# Patient Record
Sex: Male | Born: 1950
Health system: Southern US, Community
[De-identification: ages and names within clinical notes are randomized; demographics above are authoritative.]

## PROBLEM LIST (undated history)

## (undated) DIAGNOSIS — F32A Depression, unspecified: Secondary | ICD-10-CM

## (undated) DIAGNOSIS — R5383 Other fatigue: Secondary | ICD-10-CM

## (undated) DIAGNOSIS — M549 Dorsalgia, unspecified: Secondary | ICD-10-CM

## (undated) DIAGNOSIS — F419 Anxiety disorder, unspecified: Secondary | ICD-10-CM

## (undated) DIAGNOSIS — M1990 Unspecified osteoarthritis, unspecified site: Secondary | ICD-10-CM

## (undated) DIAGNOSIS — E7800 Pure hypercholesterolemia, unspecified: Secondary | ICD-10-CM

## (undated) DIAGNOSIS — R1310 Dysphagia, unspecified: Secondary | ICD-10-CM

## (undated) DIAGNOSIS — I10 Essential (primary) hypertension: Secondary | ICD-10-CM

## (undated) DIAGNOSIS — Z8719 Personal history of other diseases of the digestive system: Secondary | ICD-10-CM

## (undated) DIAGNOSIS — G4730 Sleep apnea, unspecified: Secondary | ICD-10-CM

## (undated) DIAGNOSIS — F329 Major depressive disorder, single episode, unspecified: Secondary | ICD-10-CM

## (undated) DIAGNOSIS — C44300 Unspecified malignant neoplasm of skin of unspecified part of face: Secondary | ICD-10-CM

## (undated) DIAGNOSIS — J449 Chronic obstructive pulmonary disease, unspecified: Secondary | ICD-10-CM

## (undated) DIAGNOSIS — M792 Neuralgia and neuritis, unspecified: Secondary | ICD-10-CM

## (undated) HISTORY — PX: OTHER SURGICAL HISTORY: SHX169

## (undated) HISTORY — DX: Neuralgia and neuritis, unspecified: M79.2

## (undated) HISTORY — DX: Sleep apnea, unspecified: G47.30

---

## 1997-12-04 ENCOUNTER — Encounter: Payer: Self-pay | Admitting: Otolaryngology

## 1997-12-04 ENCOUNTER — Inpatient Hospital Stay (HOSPITAL_COMMUNITY): Admission: AD | Admit: 1997-12-04 | Discharge: 1997-12-08 | Payer: Self-pay | Admitting: Family Medicine

## 1997-12-10 ENCOUNTER — Encounter: Admission: RE | Admit: 1997-12-10 | Discharge: 1997-12-10 | Payer: Self-pay | Admitting: Family Medicine

## 1999-02-05 ENCOUNTER — Encounter: Admission: RE | Admit: 1999-02-05 | Discharge: 1999-03-20 | Payer: Self-pay

## 1999-04-20 ENCOUNTER — Encounter: Payer: Self-pay | Admitting: Neurosurgery

## 1999-04-20 ENCOUNTER — Ambulatory Visit (HOSPITAL_COMMUNITY): Admission: RE | Admit: 1999-04-20 | Discharge: 1999-04-20 | Payer: Self-pay | Admitting: Neurosurgery

## 1999-05-06 ENCOUNTER — Encounter: Admission: RE | Admit: 1999-05-06 | Discharge: 1999-08-04 | Payer: Self-pay | Admitting: Neurosurgery

## 2001-07-01 ENCOUNTER — Ambulatory Visit (HOSPITAL_COMMUNITY): Admission: RE | Admit: 2001-07-01 | Discharge: 2001-07-01 | Payer: Self-pay | Admitting: Internal Medicine

## 2002-01-02 ENCOUNTER — Encounter: Payer: Self-pay | Admitting: Family Medicine

## 2002-01-02 ENCOUNTER — Ambulatory Visit (HOSPITAL_COMMUNITY): Admission: RE | Admit: 2002-01-02 | Discharge: 2002-01-02 | Payer: Self-pay | Admitting: Family Medicine

## 2003-03-03 ENCOUNTER — Emergency Department (HOSPITAL_COMMUNITY): Admission: EM | Admit: 2003-03-03 | Discharge: 2003-03-03 | Payer: Self-pay | Admitting: Emergency Medicine

## 2003-12-25 ENCOUNTER — Emergency Department (HOSPITAL_COMMUNITY): Admission: EM | Admit: 2003-12-25 | Discharge: 2003-12-25 | Payer: Self-pay | Admitting: Emergency Medicine

## 2004-01-04 ENCOUNTER — Emergency Department (HOSPITAL_COMMUNITY): Admission: EM | Admit: 2004-01-04 | Discharge: 2004-01-04 | Payer: Self-pay | Admitting: Emergency Medicine

## 2004-01-06 ENCOUNTER — Emergency Department (HOSPITAL_COMMUNITY): Admission: EM | Admit: 2004-01-06 | Discharge: 2004-01-06 | Payer: Self-pay | Admitting: Emergency Medicine

## 2004-01-09 ENCOUNTER — Ambulatory Visit (HOSPITAL_COMMUNITY): Admission: RE | Admit: 2004-01-09 | Discharge: 2004-01-09 | Payer: Self-pay | Admitting: *Deleted

## 2004-01-21 ENCOUNTER — Emergency Department (HOSPITAL_COMMUNITY): Admission: EM | Admit: 2004-01-21 | Discharge: 2004-01-21 | Payer: Self-pay | Admitting: Emergency Medicine

## 2004-11-06 ENCOUNTER — Inpatient Hospital Stay (HOSPITAL_COMMUNITY): Admission: AD | Admit: 2004-11-06 | Discharge: 2004-11-07 | Payer: Self-pay | Admitting: Family Medicine

## 2004-11-07 ENCOUNTER — Ambulatory Visit: Payer: Self-pay | Admitting: Internal Medicine

## 2004-11-14 ENCOUNTER — Ambulatory Visit (HOSPITAL_COMMUNITY): Admission: RE | Admit: 2004-11-14 | Discharge: 2004-11-14 | Payer: Self-pay | Admitting: Family Medicine

## 2004-11-26 ENCOUNTER — Ambulatory Visit: Payer: Self-pay | Admitting: Internal Medicine

## 2004-12-26 ENCOUNTER — Ambulatory Visit: Payer: Self-pay | Admitting: Internal Medicine

## 2005-02-11 ENCOUNTER — Ambulatory Visit: Payer: Self-pay | Admitting: Internal Medicine

## 2005-04-10 ENCOUNTER — Ambulatory Visit: Payer: Self-pay | Admitting: Internal Medicine

## 2005-04-14 ENCOUNTER — Ambulatory Visit: Payer: Self-pay | Admitting: Cardiology

## 2005-05-31 ENCOUNTER — Emergency Department (HOSPITAL_COMMUNITY): Admission: EM | Admit: 2005-05-31 | Discharge: 2005-05-31 | Payer: Self-pay | Admitting: Emergency Medicine

## 2005-06-01 ENCOUNTER — Ambulatory Visit: Payer: Self-pay | Admitting: Internal Medicine

## 2007-08-07 ENCOUNTER — Emergency Department (HOSPITAL_COMMUNITY): Admission: EM | Admit: 2007-08-07 | Discharge: 2007-08-07 | Payer: Self-pay | Admitting: Emergency Medicine

## 2007-08-16 ENCOUNTER — Encounter (INDEPENDENT_AMBULATORY_CARE_PROVIDER_SITE_OTHER): Payer: Self-pay | Admitting: General Surgery

## 2007-08-16 ENCOUNTER — Inpatient Hospital Stay (HOSPITAL_COMMUNITY): Admission: AD | Admit: 2007-08-16 | Discharge: 2007-08-19 | Payer: Self-pay | Admitting: Family Medicine

## 2008-01-13 DIAGNOSIS — T1490XA Injury, unspecified, initial encounter: Secondary | ICD-10-CM

## 2008-01-13 HISTORY — DX: Injury, unspecified, initial encounter: T14.90XA

## 2008-01-13 HISTORY — PX: TRACHEOSTOMY: SUR1362

## 2008-01-23 ENCOUNTER — Encounter (INDEPENDENT_AMBULATORY_CARE_PROVIDER_SITE_OTHER): Payer: Self-pay | Admitting: General Surgery

## 2008-01-23 ENCOUNTER — Ambulatory Visit (HOSPITAL_COMMUNITY): Admission: RE | Admit: 2008-01-23 | Discharge: 2008-01-23 | Payer: Self-pay | Admitting: General Surgery

## 2008-02-03 ENCOUNTER — Emergency Department (HOSPITAL_COMMUNITY): Admission: EM | Admit: 2008-02-03 | Discharge: 2008-02-03 | Payer: Self-pay | Admitting: Emergency Medicine

## 2008-05-09 ENCOUNTER — Ambulatory Visit: Admission: RE | Admit: 2008-05-09 | Discharge: 2008-05-09 | Payer: Self-pay | Admitting: Family Medicine

## 2008-06-25 ENCOUNTER — Ambulatory Visit: Payer: Self-pay | Admitting: Surgery

## 2008-08-21 ENCOUNTER — Ambulatory Visit: Payer: Self-pay | Admitting: Vascular Surgery

## 2008-12-13 ENCOUNTER — Ambulatory Visit: Payer: Self-pay | Admitting: Vascular Surgery

## 2008-12-21 ENCOUNTER — Inpatient Hospital Stay (HOSPITAL_COMMUNITY): Admission: EM | Admit: 2008-12-21 | Discharge: 2009-01-01 | Payer: Self-pay | Admitting: Emergency Medicine

## 2008-12-21 ENCOUNTER — Encounter: Payer: Self-pay | Admitting: Emergency Medicine

## 2009-01-01 ENCOUNTER — Inpatient Hospital Stay: Admission: AD | Admit: 2009-01-01 | Discharge: 2009-02-04 | Payer: Self-pay

## 2009-01-01 ENCOUNTER — Ambulatory Visit: Payer: Self-pay | Admitting: Critical Care Medicine

## 2009-01-14 ENCOUNTER — Ambulatory Visit: Payer: Self-pay | Admitting: Pulmonary Disease

## 2009-04-04 ENCOUNTER — Encounter: Admission: RE | Admit: 2009-04-04 | Discharge: 2009-04-04 | Payer: Self-pay | Admitting: Neurosurgery

## 2009-06-01 ENCOUNTER — Emergency Department (HOSPITAL_COMMUNITY): Admission: EM | Admit: 2009-06-01 | Discharge: 2009-06-01 | Payer: Self-pay | Admitting: Emergency Medicine

## 2009-06-12 ENCOUNTER — Encounter: Admission: RE | Admit: 2009-06-12 | Discharge: 2009-06-12 | Payer: Self-pay | Admitting: Neurosurgery

## 2009-11-04 ENCOUNTER — Ambulatory Visit: Payer: Self-pay | Admitting: Surgery

## 2010-03-30 LAB — COMPREHENSIVE METABOLIC PANEL
ALT: 24 U/L (ref 0–53)
ALT: 33 U/L (ref 0–53)
AST: 16 U/L (ref 0–37)
Alkaline Phosphatase: 92 U/L (ref 39–117)
BUN: 10 mg/dL (ref 6–23)
CO2: 30 mEq/L (ref 19–32)
CO2: 31 mEq/L (ref 19–32)
Calcium: 8.6 mg/dL (ref 8.4–10.5)
Calcium: 8.6 mg/dL (ref 8.4–10.5)
Creatinine, Ser: 0.51 mg/dL (ref 0.4–1.5)
GFR calc non Af Amer: >60 mL/min (ref >60)
Glucose, Bld: 126 mg/dL — ABNORMAL HIGH (ref 70–99)
Potassium: 3.9 mEq/L (ref 3.5–5.1)
Sodium: 134 mEq/L — ABNORMAL LOW (ref 135–145)
Sodium: 134 mEq/L — ABNORMAL LOW (ref 135–145)
Total Protein: 5.9 g/dL — ABNORMAL LOW (ref 6.0–8.3)
Total Protein: 6.5 g/dL (ref 6.0–8.3)

## 2010-03-30 LAB — CBC
HCT: 26.7 % — ABNORMAL LOW (ref 39.0–52.0)
Hemoglobin: 9.4 g/dL — ABNORMAL LOW (ref 13.0–17.0)
MCHC: 34.7 g/dL (ref 30.0–36.0)
MCHC: 35.1 g/dL (ref 30.0–36.0)
MCV: 89.5 fL (ref 78.0–100.0)
MCV: 90.6 fL (ref 78.0–100.0)
Platelets: 142 10*3/uL — ABNORMAL LOW (ref 150–400)
RBC: 2.95 MIL/uL — ABNORMAL LOW (ref 4.22–5.81)
RBC: 3.09 MIL/uL — ABNORMAL LOW (ref 4.22–5.81)
WBC: 7.8 10*3/uL (ref 4.0–10.5)
WBC: 7.8 10*3/uL (ref 4.0–10.5)

## 2010-03-30 LAB — MAGNESIUM: Magnesium: 1.8 mg/dL (ref 1.5–2.5)

## 2010-03-30 LAB — DIFFERENTIAL
Basophils Absolute: 0 10*3/uL (ref 0.0–0.1)
Eosinophils Absolute: 0.2 10*3/uL (ref 0.0–0.7)
Eosinophils Relative: 3 % (ref 0–5)
Eosinophils Relative: 3 % (ref 0–5)
Lymphocytes Relative: 14 % (ref 12–46)
Lymphocytes Relative: 18 % (ref 12–46)
Lymphs Abs: 1 10*3/uL (ref 0.7–4.0)
Lymphs Abs: 1.4 10*3/uL (ref 0.7–4.0)
Monocytes Absolute: 0.7 10*3/uL (ref 0.1–1.0)
Neutrophils Relative %: 71 % (ref 43–77)

## 2010-03-30 LAB — PREALBUMIN: Prealbumin: 20.8 mg/dL (ref 18.0–45.0)

## 2010-03-31 LAB — CBC
HCT: 33.4 % — ABNORMAL LOW (ref 39.0–52.0)
MCHC: 33.7 g/dL (ref 30.0–36.0)
MCV: 89.4 fL (ref 78.0–100.0)
Platelets: 302 10*3/uL (ref 150–400)
RDW: 15.4 % (ref 11.5–15.5)

## 2010-03-31 LAB — BASIC METABOLIC PANEL
BUN: 8 mg/dL (ref 6–23)
CO2: 30 mEq/L (ref 19–32)
Calcium: 8.7 mg/dL (ref 8.4–10.5)
Chloride: 98 mEq/L (ref 96–112)
Creatinine, Ser: 0.63 mg/dL (ref 0.4–1.5)
Glucose, Bld: 102 mg/dL — ABNORMAL HIGH (ref 70–99)

## 2010-04-14 LAB — COMPREHENSIVE METABOLIC PANEL
ALT: 69 U/L — ABNORMAL HIGH (ref 0–53)
ALT: 79 U/L — ABNORMAL HIGH (ref 0–53)
AST: 46 U/L — ABNORMAL HIGH (ref 0–37)
Albumin: 1.8 g/dL — ABNORMAL LOW (ref 3.5–5.2)
Albumin: 1.8 g/dL — ABNORMAL LOW (ref 3.5–5.2)
Albumin: 2.2 g/dL — ABNORMAL LOW (ref 3.5–5.2)
BUN: 24 mg/dL — ABNORMAL HIGH (ref 6–23)
CO2: 29 mEq/L (ref 19–32)
Calcium: 8 mg/dL — ABNORMAL LOW (ref 8.4–10.5)
Calcium: 8.1 mg/dL — ABNORMAL LOW (ref 8.4–10.5)
Creatinine, Ser: 0.61 mg/dL (ref 0.4–1.5)
Creatinine, Ser: 0.82 mg/dL (ref 0.4–1.5)
GFR calc Af Amer: >60 mL/min (ref >60)
GFR calc Af Amer: >60 mL/min (ref >60)
GFR calc non Af Amer: >60 mL/min (ref >60)
Glucose, Bld: 155 mg/dL — ABNORMAL HIGH (ref 70–99)
Glucose, Bld: 165 mg/dL — ABNORMAL HIGH (ref 70–99)
Potassium: 4.2 mEq/L (ref 3.5–5.1)
Sodium: 134 mEq/L — ABNORMAL LOW (ref 135–145)
Sodium: 142 mEq/L (ref 135–145)
Total Protein: 6 g/dL (ref 6.0–8.3)
Total Protein: 6 g/dL (ref 6.0–8.3)

## 2010-04-14 LAB — BLOOD GAS, ARTERIAL
Acid-Base Excess: 3.7 mmol/L — ABNORMAL HIGH (ref 0.0–2.0)
Acid-Base Excess: 6.8 mmol/L — ABNORMAL HIGH (ref 0.0–2.0)
Acid-Base Excess: 7.8 mmol/L — ABNORMAL HIGH (ref 0.0–2.0)
Bicarbonate: 27.7 mEq/L — ABNORMAL HIGH (ref 20.0–24.0)
Bicarbonate: 31.3 mEq/L — ABNORMAL HIGH (ref 20.0–24.0)
Bicarbonate: 31.7 mEq/L — ABNORMAL HIGH (ref 20.0–24.0)
Drawn by: 31004
Drawn by: 319961
FIO2: 0.3 %
FIO2: 0.4 %
FIO2: 0.4 %
MECHVT: 550 mL
MECHVT: 550 mL
MECHVT: 650 mL
O2 Saturation: 98.3 %
O2 Saturation: 99.1 %
PEEP: 5 cmH2O
PEEP: 5 cmH2O
PEEP: 5 cmH2O
PEEP: 5 cmH2O
Patient temperature: 98.6
Patient temperature: 98.6
Patient temperature: 99
RATE: 18 resp/min
RATE: 18 resp/min
RATE: 18 resp/min
RATE: 18 resp/min
TCO2: 29 mmol/L (ref 0–100)
TCO2: 31.1 mmol/L (ref 0–100)
pCO2 arterial: 43.9 mmHg (ref 35.0–45.0)
pCO2 arterial: 51.3 mmHg — ABNORMAL HIGH (ref 35.0–45.0)
pH, Arterial: 7.461 — ABNORMAL HIGH (ref 7.350–7.450)
pO2, Arterial: 127 mmHg — ABNORMAL HIGH (ref 80.0–100.0)
pO2, Arterial: 91.1 mmHg (ref 80.0–100.0)
pO2, Arterial: 96.9 mmHg (ref 80.0–100.0)

## 2010-04-14 LAB — CBC
HCT: 25.6 % — ABNORMAL LOW (ref 39.0–52.0)
HCT: 25.9 % — ABNORMAL LOW (ref 39.0–52.0)
HCT: 26.2 % — ABNORMAL LOW (ref 39.0–52.0)
HCT: 28 % — ABNORMAL LOW (ref 39.0–52.0)
HCT: 29.7 % — ABNORMAL LOW (ref 39.0–52.0)
Hemoglobin: 8.1 g/dL — ABNORMAL LOW (ref 13.0–17.0)
Hemoglobin: 8.8 g/dL — ABNORMAL LOW (ref 13.0–17.0)
Hemoglobin: 8.9 g/dL — ABNORMAL LOW (ref 13.0–17.0)
Hemoglobin: 9 g/dL — ABNORMAL LOW (ref 13.0–17.0)
Hemoglobin: 9.5 g/dL — ABNORMAL LOW (ref 13.0–17.0)
MCHC: 33.6 g/dL (ref 30.0–36.0)
MCHC: 33.9 g/dL (ref 30.0–36.0)
MCHC: 34.3 g/dL (ref 30.0–36.0)
MCHC: 34.5 g/dL (ref 30.0–36.0)
MCV: 92.8 fL (ref 78.0–100.0)
MCV: 93.4 fL (ref 78.0–100.0)
MCV: 93.4 fL (ref 78.0–100.0)
MCV: 94.1 fL (ref 78.0–100.0)
MCV: 94.2 fL (ref 78.0–100.0)
MCV: 94.9 fL (ref 78.0–100.0)
Platelets: 202 10*3/uL (ref 150–400)
Platelets: 214 10*3/uL (ref 150–400)
Platelets: 225 10*3/uL (ref 150–400)
Platelets: 239 10*3/uL (ref 150–400)
Platelets: 305 10*3/uL (ref 150–400)
RBC: 2.58 MIL/uL — ABNORMAL LOW (ref 4.22–5.81)
RBC: 2.75 MIL/uL — ABNORMAL LOW (ref 4.22–5.81)
RBC: 2.75 MIL/uL — ABNORMAL LOW (ref 4.22–5.81)
RBC: 2.94 MIL/uL — ABNORMAL LOW (ref 4.22–5.81)
RBC: 2.98 MIL/uL — ABNORMAL LOW (ref 4.22–5.81)
RDW: 14.8 % (ref 11.5–15.5)
RDW: 15.2 % (ref 11.5–15.5)
RDW: 15.9 % — ABNORMAL HIGH (ref 11.5–15.5)
WBC: 10.6 10*3/uL — ABNORMAL HIGH (ref 4.0–10.5)
WBC: 11.3 10*3/uL — ABNORMAL HIGH (ref 4.0–10.5)
WBC: 11.9 10*3/uL — ABNORMAL HIGH (ref 4.0–10.5)
WBC: 8.1 10*3/uL (ref 4.0–10.5)
WBC: 9.4 10*3/uL (ref 4.0–10.5)
WBC: 9.4 10*3/uL (ref 4.0–10.5)

## 2010-04-14 LAB — BASIC METABOLIC PANEL
BUN: 19 mg/dL (ref 6–23)
BUN: 20 mg/dL (ref 6–23)
BUN: 22 mg/dL (ref 6–23)
CO2: 30 mEq/L (ref 19–32)
CO2: 31 mEq/L (ref 19–32)
CO2: 31 mEq/L (ref 19–32)
CO2: 32 mEq/L (ref 19–32)
CO2: 34 mEq/L — ABNORMAL HIGH (ref 19–32)
Calcium: 7.2 mg/dL — ABNORMAL LOW (ref 8.4–10.5)
Calcium: 7.8 mg/dL — ABNORMAL LOW (ref 8.4–10.5)
Calcium: 7.9 mg/dL — ABNORMAL LOW (ref 8.4–10.5)
Calcium: 8 mg/dL — ABNORMAL LOW (ref 8.4–10.5)
Chloride: 108 mEq/L (ref 96–112)
Chloride: 109 mEq/L (ref 96–112)
Chloride: 110 mEq/L (ref 96–112)
Chloride: 111 mEq/L (ref 96–112)
Chloride: 112 mEq/L (ref 96–112)
Creatinine, Ser: 0.93 mg/dL (ref 0.4–1.5)
Creatinine, Ser: 0.98 mg/dL (ref 0.4–1.5)
Creatinine, Ser: 1.01 mg/dL (ref 0.4–1.5)
GFR calc Af Amer: >60 mL/min (ref >60)
GFR calc Af Amer: >60 mL/min (ref >60)
GFR calc Af Amer: >60 mL/min (ref >60)
GFR calc non Af Amer: >60 mL/min (ref >60)
GFR calc non Af Amer: >60 mL/min (ref >60)
Glucose, Bld: 154 mg/dL — ABNORMAL HIGH (ref 70–99)
Potassium: 3.4 mEq/L — ABNORMAL LOW (ref 3.5–5.1)
Potassium: 3.5 mEq/L (ref 3.5–5.1)
Potassium: 3.7 mEq/L (ref 3.5–5.1)
Sodium: 146 mEq/L — ABNORMAL HIGH (ref 135–145)
Sodium: 148 mEq/L — ABNORMAL HIGH (ref 135–145)
Sodium: 150 mEq/L — ABNORMAL HIGH (ref 135–145)

## 2010-04-14 LAB — GLUCOSE, CAPILLARY
Glucose-Capillary: 110 mg/dL — ABNORMAL HIGH (ref 70–99)
Glucose-Capillary: 132 mg/dL — ABNORMAL HIGH (ref 70–99)
Glucose-Capillary: 132 mg/dL — ABNORMAL HIGH (ref 70–99)
Glucose-Capillary: 136 mg/dL — ABNORMAL HIGH (ref 70–99)
Glucose-Capillary: 139 mg/dL — ABNORMAL HIGH (ref 70–99)
Glucose-Capillary: 149 mg/dL — ABNORMAL HIGH (ref 70–99)
Glucose-Capillary: 164 mg/dL — ABNORMAL HIGH (ref 70–99)
Glucose-Capillary: 170 mg/dL — ABNORMAL HIGH (ref 70–99)
Glucose-Capillary: 192 mg/dL — ABNORMAL HIGH (ref 70–99)
Glucose-Capillary: 247 mg/dL — ABNORMAL HIGH (ref 70–99)
Glucose-Capillary: 261 mg/dL — ABNORMAL HIGH (ref 70–99)
Glucose-Capillary: 266 mg/dL — ABNORMAL HIGH (ref 70–99)

## 2010-04-14 LAB — VITAMIN D 1,25 DIHYDROXY
Vitamin D 1, 25 (OH)2 Total: 20 pg/mL (ref 18–72)
Vitamin D 1, 25 (OH)2 Total: 23 pg/mL (ref 18–72)
Vitamin D2 1, 25 (OH)2: <8 pg/mL
Vitamin D2 1, 25 (OH)2: <8 pg/mL
Vitamin D3 1, 25 (OH)2: 20 pg/mL
Vitamin D3 1, 25 (OH)2: 23 pg/mL

## 2010-04-14 LAB — PREALBUMIN: Prealbumin: 19.1 mg/dL (ref 18.0–45.0)

## 2010-04-14 LAB — DIFFERENTIAL
Basophils Absolute: 0 10*3/uL (ref 0.0–0.1)
Basophils Relative: 0 % (ref 0–1)
Eosinophils Absolute: 0.2 10*3/uL (ref 0.0–0.7)
Lymphocytes Relative: 11 % — ABNORMAL LOW (ref 12–46)
Lymphs Abs: 1.1 10*3/uL (ref 0.7–4.0)
Monocytes Absolute: 0.5 10*3/uL (ref 0.1–1.0)
Monocytes Absolute: 0.7 10*3/uL (ref 0.1–1.0)
Monocytes Relative: 4 % (ref 3–12)
Monocytes Relative: 7 % (ref 3–12)
Neutro Abs: 10.1 10*3/uL — ABNORMAL HIGH (ref 1.7–7.7)
Neutrophils Relative %: 81 % — ABNORMAL HIGH (ref 43–77)
Neutrophils Relative %: 82 % — ABNORMAL HIGH (ref 43–77)

## 2010-04-14 LAB — LIPID PANEL
Cholesterol: 112 mg/dL (ref 0–200)
LDL Cholesterol: 81 mg/dL (ref 0–99)

## 2010-04-14 LAB — FERRITIN: Ferritin: 853 ng/mL — ABNORMAL HIGH (ref 22–322)

## 2010-04-14 LAB — IRON AND TIBC
Saturation Ratios: 14 % — ABNORMAL LOW (ref 20–55)
TIBC: 155 ug/dL — ABNORMAL LOW (ref 215–435)
UIBC: 133 ug/dL

## 2010-04-14 LAB — TSH: TSH: 2.939 u[IU]/mL (ref 0.350–4.500)

## 2010-04-15 LAB — BASIC METABOLIC PANEL
BUN: 18 mg/dL (ref 6–23)
BUN: 22 mg/dL (ref 6–23)
BUN: 39 mg/dL — ABNORMAL HIGH (ref 6–23)
BUN: 41 mg/dL — ABNORMAL HIGH (ref 6–23)
CO2: 26 mEq/L (ref 19–32)
CO2: 29 mEq/L (ref 19–32)
Calcium: 7.6 mg/dL — ABNORMAL LOW (ref 8.4–10.5)
Calcium: 7.9 mg/dL — ABNORMAL LOW (ref 8.4–10.5)
Calcium: 8.3 mg/dL — ABNORMAL LOW (ref 8.4–10.5)
Chloride: 100 mEq/L (ref 96–112)
Chloride: 101 mEq/L (ref 96–112)
Chloride: 106 mEq/L (ref 96–112)
Creatinine, Ser: 0.9 mg/dL (ref 0.4–1.5)
Creatinine, Ser: 1.91 mg/dL — ABNORMAL HIGH (ref 0.4–1.5)
GFR calc Af Amer: 24 mL/min — ABNORMAL LOW (ref >60)
GFR calc Af Amer: 44 mL/min — ABNORMAL LOW (ref >60)
GFR calc Af Amer: >60 mL/min (ref >60)
GFR calc non Af Amer: 20 mL/min — ABNORMAL LOW (ref >60)
GFR calc non Af Amer: 36 mL/min — ABNORMAL LOW (ref >60)
GFR calc non Af Amer: 37 mL/min — ABNORMAL LOW (ref >60)
GFR calc non Af Amer: >60 mL/min (ref >60)
Glucose, Bld: 124 mg/dL — ABNORMAL HIGH (ref 70–99)
Glucose, Bld: 195 mg/dL — ABNORMAL HIGH (ref 70–99)
Glucose, Bld: 212 mg/dL — ABNORMAL HIGH (ref 70–99)
Glucose, Bld: 213 mg/dL — ABNORMAL HIGH (ref 70–99)
Potassium: 3.5 mEq/L (ref 3.5–5.1)
Potassium: 3.7 mEq/L (ref 3.5–5.1)
Potassium: 4.7 mEq/L (ref 3.5–5.1)
Potassium: 4.9 mEq/L (ref 3.5–5.1)
Sodium: 131 mEq/L — ABNORMAL LOW (ref 135–145)
Sodium: 133 mEq/L — ABNORMAL LOW (ref 135–145)
Sodium: 135 mEq/L (ref 135–145)
Sodium: 142 mEq/L (ref 135–145)

## 2010-04-15 LAB — URINALYSIS, ROUTINE W REFLEX MICROSCOPIC
Ketones, ur: NEGATIVE mg/dL
Leukocytes, UA: NEGATIVE
Leukocytes, UA: NEGATIVE
Nitrite: NEGATIVE
Nitrite: NEGATIVE
Protein, ur: NEGATIVE mg/dL
Specific Gravity, Urine: 1.036 — ABNORMAL HIGH (ref 1.005–1.030)
Urobilinogen, UA: 0.2 mg/dL (ref 0.0–1.0)
Urobilinogen, UA: 0.2 mg/dL (ref 0.0–1.0)
pH: 5 (ref 5.0–8.0)

## 2010-04-15 LAB — URINE CULTURE
Colony Count: 50000
Colony Count: NO GROWTH
Culture: NO GROWTH

## 2010-04-15 LAB — CBC
HCT: 47.8 % (ref 39.0–52.0)
HCT: 29.1 % — ABNORMAL LOW (ref 39.0–52.0)
HCT: 29.5 % — ABNORMAL LOW (ref 39.0–52.0)
HCT: 30.7 % — ABNORMAL LOW (ref 39.0–52.0)
HCT: 46.8 % (ref 39.0–52.0)
Hemoglobin: 16.3 g/dL (ref 13.0–17.0)
Hemoglobin: 10 g/dL — ABNORMAL LOW (ref 13.0–17.0)
Hemoglobin: 10.3 g/dL — ABNORMAL LOW (ref 13.0–17.0)
Hemoglobin: 10.7 g/dL — ABNORMAL LOW (ref 13.0–17.0)
Hemoglobin: 12.2 g/dL — ABNORMAL LOW (ref 13.0–17.0)
Hemoglobin: 15.7 g/dL (ref 13.0–17.0)
MCHC: 34.4 g/dL (ref 30.0–36.0)
MCHC: 34.9 g/dL (ref 30.0–36.0)
MCHC: 35 g/dL (ref 30.0–36.0)
MCHC: 35.1 g/dL (ref 30.0–36.0)
MCV: 92.8 fL (ref 78.0–100.0)
MCV: 93 fL (ref 78.0–100.0)
MCV: 93 fL (ref 78.0–100.0)
Platelets: 128 10*3/uL — ABNORMAL LOW (ref 150–400)
Platelets: 149 10*3/uL — ABNORMAL LOW (ref 150–400)
Platelets: 160 10*3/uL (ref 150–400)
Platelets: 203 10*3/uL (ref 150–400)
RBC: 5.23 MIL/uL (ref 4.22–5.81)
RBC: 3.13 MIL/uL — ABNORMAL LOW (ref 4.22–5.81)
RBC: 3.18 MIL/uL — ABNORMAL LOW (ref 4.22–5.81)
RBC: 3.31 MIL/uL — ABNORMAL LOW (ref 4.22–5.81)
RBC: 3.78 MIL/uL — ABNORMAL LOW (ref 4.22–5.81)
RBC: 4.88 MIL/uL (ref 4.22–5.81)
RDW: 13.5 % (ref 11.5–15.5)
RDW: 14 % (ref 11.5–15.5)
RDW: 14.2 % (ref 11.5–15.5)
RDW: 14.4 % (ref 11.5–15.5)
WBC: 10.1 10*3/uL (ref 4.0–10.5)
WBC: 10.3 10*3/uL (ref 4.0–10.5)
WBC: 10.7 10*3/uL — ABNORMAL HIGH (ref 4.0–10.5)
WBC: 9.1 10*3/uL (ref 4.0–10.5)

## 2010-04-15 LAB — BLOOD GAS, ARTERIAL
Acid-Base Excess: 3.2 mmol/L — ABNORMAL HIGH (ref 0.0–2.0)
Acid-Base Excess: 4.8 mmol/L — ABNORMAL HIGH (ref 0.0–2.0)
Acid-Base Excess: 5.6 mmol/L — ABNORMAL HIGH (ref 0.0–2.0)
Acid-base deficit: 1.8 mmol/L (ref 0.0–2.0)
Acid-base deficit: 3.1 mmol/L — ABNORMAL HIGH (ref 0.0–2.0)
Bicarbonate: 22.5 mEq/L (ref 20.0–24.0)
Bicarbonate: 25.7 mEq/L — ABNORMAL HIGH (ref 20.0–24.0)
Bicarbonate: 26.7 mEq/L — ABNORMAL HIGH (ref 20.0–24.0)
Bicarbonate: 28 mEq/L — ABNORMAL HIGH (ref 20.0–24.0)
Bicarbonate: 30.2 mEq/L — ABNORMAL HIGH (ref 20.0–24.0)
Drawn by: 249101
Drawn by: 249101
FIO2: 0.4 %
FIO2: 0.5 %
FIO2: 1 %
FIO2: 40 %
MECHVT: 550 mL
MECHVT: 600 mL
MECHVT: 600 mL
MECHVT: 600 mL
MECHVT: 600 mL
O2 Saturation: 93.1 %
O2 Saturation: 94.1 %
O2 Saturation: 95 %
O2 Saturation: 95.7 %
O2 Saturation: 96 %
O2 Saturation: 96.5 %
O2 Saturation: 99.1 %
PEEP: 5 cmH2O
PEEP: 5 cmH2O
Patient temperature: 98.4
Patient temperature: 98.6
Patient temperature: 98.6
Patient temperature: 98.6
Patient temperature: 99.8
RATE: 20 resp/min
RATE: 26 resp/min
RATE: 26 resp/min
TCO2: 23.4 mmol/L (ref 0–100)
TCO2: 23.7 mmol/L (ref 0–100)
TCO2: 27.2 mmol/L (ref 0–100)
TCO2: 27.9 mmol/L (ref 0–100)
TCO2: 29.1 mmol/L (ref 0–100)
TCO2: 29.3 mmol/L (ref 0–100)
TCO2: 31.7 mmol/L (ref 0–100)
pCO2 arterial: 35.4 mmHg (ref 35.0–45.0)
pCO2 arterial: 37.3 mmHg (ref 35.0–45.0)
pCO2 arterial: 48.4 mmHg — ABNORMAL HIGH (ref 35.0–45.0)
pH, Arterial: 7.101 — CL (ref 7.350–7.450)
pH, Arterial: 7.278 — ABNORMAL LOW (ref 7.350–7.450)
pH, Arterial: 7.378 (ref 7.350–7.450)
pH, Arterial: 7.468 — ABNORMAL HIGH (ref 7.350–7.450)
pH, Arterial: 7.509 — ABNORMAL HIGH (ref 7.350–7.450)
pO2, Arterial: 109 mmHg — ABNORMAL HIGH (ref 80.0–100.0)
pO2, Arterial: 275 mmHg — ABNORMAL HIGH (ref 80.0–100.0)
pO2, Arterial: 68.8 mmHg — ABNORMAL LOW (ref 80.0–100.0)
pO2, Arterial: 69.5 mmHg — ABNORMAL LOW (ref 80.0–100.0)
pO2, Arterial: 78.3 mmHg — ABNORMAL LOW (ref 80.0–100.0)

## 2010-04-15 LAB — POTASSIUM: Potassium: 6.8 mEq/L (ref 3.5–5.1)

## 2010-04-15 LAB — URINE MICROSCOPIC-ADD ON

## 2010-04-15 LAB — URINALYSIS, MICROSCOPIC ONLY
Bilirubin Urine: NEGATIVE
Glucose, UA: 100 mg/dL — AB
Ketones, ur: NEGATIVE mg/dL
Leukocytes, UA: NEGATIVE
Protein, ur: NEGATIVE mg/dL

## 2010-04-15 LAB — GLUCOSE, CAPILLARY
Glucose-Capillary: 134 mg/dL — ABNORMAL HIGH (ref 70–99)
Glucose-Capillary: 144 mg/dL — ABNORMAL HIGH (ref 70–99)
Glucose-Capillary: 150 mg/dL — ABNORMAL HIGH (ref 70–99)
Glucose-Capillary: 153 mg/dL — ABNORMAL HIGH (ref 70–99)
Glucose-Capillary: 160 mg/dL — ABNORMAL HIGH (ref 70–99)
Glucose-Capillary: 169 mg/dL — ABNORMAL HIGH (ref 70–99)
Glucose-Capillary: 171 mg/dL — ABNORMAL HIGH (ref 70–99)
Glucose-Capillary: 183 mg/dL — ABNORMAL HIGH (ref 70–99)
Glucose-Capillary: 190 mg/dL — ABNORMAL HIGH (ref 70–99)
Glucose-Capillary: 191 mg/dL — ABNORMAL HIGH (ref 70–99)
Glucose-Capillary: 209 mg/dL — ABNORMAL HIGH (ref 70–99)
Glucose-Capillary: 215 mg/dL — ABNORMAL HIGH (ref 70–99)
Glucose-Capillary: 216 mg/dL — ABNORMAL HIGH (ref 70–99)
Glucose-Capillary: 231 mg/dL — ABNORMAL HIGH (ref 70–99)
Glucose-Capillary: 234 mg/dL — ABNORMAL HIGH (ref 70–99)

## 2010-04-15 LAB — CULTURE, BAL-QUANTITATIVE W GRAM STAIN
Colony Count: >=100000
Colony Count: >=100000

## 2010-04-15 LAB — CULTURE, BLOOD (ROUTINE X 2)
Culture: NO GROWTH
Culture: NO GROWTH

## 2010-04-15 LAB — COMPREHENSIVE METABOLIC PANEL
ALT: 27 U/L (ref 0–53)
ALT: 51 U/L (ref 0–53)
Alkaline Phosphatase: 69 U/L (ref 39–117)
BUN: 15 mg/dL (ref 6–23)
CO2: 25 mEq/L (ref 19–32)
CO2: 30 mEq/L (ref 19–32)
Calcium: 7.7 mg/dL — ABNORMAL LOW (ref 8.4–10.5)
Chloride: 108 mEq/L (ref 96–112)
Creatinine, Ser: 1 mg/dL (ref 0.4–1.5)
GFR calc non Af Amer: >60 mL/min (ref >60)
GFR calc non Af Amer: >60 mL/min (ref >60)
Glucose, Bld: 273 mg/dL — ABNORMAL HIGH (ref 70–99)
Glucose, Bld: 149 mg/dL — ABNORMAL HIGH (ref 70–99)
Potassium: 3.6 mEq/L (ref 3.5–5.1)
Sodium: 131 mEq/L — ABNORMAL LOW (ref 135–145)
Total Bilirubin: 0.7 mg/dL (ref 0.3–1.2)
Total Bilirubin: 0.9 mg/dL (ref 0.3–1.2)

## 2010-04-15 LAB — DIFFERENTIAL
Basophils Absolute: 0.1 10*3/uL (ref 0.0–0.1)
Basophils Absolute: 0 10*3/uL (ref 0.0–0.1)
Basophils Relative: 0 % (ref 0–1)
Basophils Relative: 0 % (ref 0–1)
Eosinophils Absolute: 0.2 10*3/uL (ref 0.0–0.7)
Eosinophils Absolute: 0.1 10*3/uL (ref 0.0–0.7)
Eosinophils Relative: 1 % (ref 0–5)
Lymphocytes Relative: 9 % — ABNORMAL LOW (ref 12–46)
Lymphs Abs: 1 10*3/uL (ref 0.7–4.0)
Monocytes Absolute: 0.8 10*3/uL (ref 0.1–1.0)
Monocytes Relative: 8 % (ref 3–12)
Neutro Abs: 8.8 10*3/uL — ABNORMAL HIGH (ref 1.7–7.7)
Neutrophils Relative %: 77 % (ref 43–77)
Neutrophils Relative %: 82 % — ABNORMAL HIGH (ref 43–77)

## 2010-04-15 LAB — RENAL FUNCTION PANEL
BUN: 27 mg/dL — ABNORMAL HIGH (ref 6–23)
Creatinine, Ser: 1.17 mg/dL (ref 0.4–1.5)
Glucose, Bld: 181 mg/dL — ABNORMAL HIGH (ref 70–99)
Phosphorus: 3 mg/dL (ref 2.3–4.6)
Potassium: 3.2 mEq/L — ABNORMAL LOW (ref 3.5–5.1)

## 2010-04-15 LAB — PHOSPHORUS: Phosphorus: 4.6 mg/dL (ref 2.3–4.6)

## 2010-04-15 LAB — AMYLASE: Amylase: 62 U/L (ref 0–105)

## 2010-04-15 LAB — CK: Total CK: 1985 U/L — ABNORMAL HIGH (ref 7–232)

## 2010-04-15 LAB — ETHANOL: Alcohol, Ethyl (B): <5 mg/dL (ref 0–10)

## 2010-04-28 LAB — CBC
HCT: 47.1 % (ref 39.0–52.0)
Platelets: 227 10*3/uL (ref 150–400)
WBC: 10.7 10*3/uL — ABNORMAL HIGH (ref 4.0–10.5)

## 2010-04-28 LAB — DIFFERENTIAL
Eosinophils Relative: 2 % (ref 0–5)
Lymphocytes Relative: 17 % (ref 12–46)
Lymphs Abs: 1.8 10*3/uL (ref 0.7–4.0)
Neutrophils Relative %: 75 % (ref 43–77)

## 2010-04-28 LAB — BASIC METABOLIC PANEL
BUN: 13 mg/dL (ref 6–23)
GFR calc non Af Amer: >60 mL/min (ref >60)
Potassium: 3.6 mEq/L (ref 3.5–5.1)

## 2010-04-28 LAB — GLUCOSE, CAPILLARY: Glucose-Capillary: 145 mg/dL — ABNORMAL HIGH (ref 70–99)

## 2010-05-23 ENCOUNTER — Other Ambulatory Visit: Payer: Self-pay | Admitting: Family Medicine

## 2010-05-23 ENCOUNTER — Ambulatory Visit (HOSPITAL_COMMUNITY)
Admission: RE | Admit: 2010-05-23 | Discharge: 2010-05-23 | Disposition: A | Discharge status: Home / Self Care | Payer: MEDICARE | Source: Ambulatory Visit | Attending: Family Medicine | Admitting: Family Medicine

## 2010-05-23 DIAGNOSIS — R110 Nausea: Secondary | ICD-10-CM | POA: Insufficient documentation

## 2010-05-23 DIAGNOSIS — R109 Unspecified abdominal pain: Secondary | ICD-10-CM | POA: Insufficient documentation

## 2010-05-23 MED ORDER — IOHEXOL 300 MG/ML  SOLN
100.0000 mL | Freq: Once | INTRAMUSCULAR | Status: AC | PRN
  Administered 2010-05-23: 100 mL via INTRAVENOUS

## 2010-05-27 NOTE — Assessment & Plan Note (Signed)
OFFICE VISIT   WIN, GUAJARDO  DOB:  23-Nov-1950                                       11/04/2009  EAVWU#:[id number]   The patient returns today for followup of his leg swelling.  He  continues to wear his compression stockings religiously.  He does note  that when he does not wear them his legs swell up rather quickly.  We  have ruled out venous insufficiency as a cause of the swelling.  This  most likely represents lymphedema.  He has not had any ulcerations.  He  does not have any pain associated with this.   PHYSICAL EXAMINATION:  Vital signs:  Heart rate 91, blood pressure  162/107, O2 sat 97%.  General:  He is well-appearing, in no distress.  Lungs:  Respirations nonlabored.  Extremities:  Warm and well-perfused.  There are no active ulcerations at this time.  He still has significant  pitting edema from up to the knee.   ASSESSMENT AND PLAN:  Lymphedema.   The patient is going to require lifelong compression therapy.  He is  instructed to come back to see me if he were to develop any open wounds.  He has been instructed to keep his legs elevated as much as possible and  to definitely wear his compression stockings when he is going to be on  his feet all day.  I have told him to regularly replace his compression  stockings as they get worn out, at least twice a year.  I am going to  have him see me on a p.r.n. basis now.  He will contact me if he has any  questions.     Jorge Ny, MD  Electronically Signed   VWB/MEDQ  D:  11/04/2009  T:  11/05/2009  Job:  [id number]   cc:   Tilford Pillar, MD  Donna Bernard, M.D.

## 2010-05-27 NOTE — Consult Note (Signed)
Dennis Mitchell, PERRELLI               ACCOUNT NO.:  [id number]   MEDICAL RECORD NO.:  [id number]          PATIENT TYPE:  INP   LOCATION:  A332                          FACILITY:  [facility]   PHYSICIAN:  Tilford Pillar, MD      DATE OF BIRTH:  [birth year]   DATE OF CONSULTATION:  08/16/2007  DATE OF DISCHARGE:                                 CONSULTATION   CHIEF COMPLAINT:  Right leg pain, status post dog bite.   HISTORY OF PRESENT ILLNESS:  The patient is a 60 year old male with a  history of hypertension, diabetes mellitus, and hyperlipidemia, who  presents from Dr. Fletcher Anon office with increasing right leg pain and  erythema.  He states, approximately 1-week ago, he was bit by the family  Bertram Denver on his right leg.  He was evaluated initially in the  emergency department and was given antibiotics, had the wound closed  with Steri-Strips and was discharged to home.  He states, he had  continued to have some improvement without any discharge, drainage,  fevers, chills, nausea, or vomiting.  However, approximately 24 hours  ago, he had increasing pain in the right leg, increasing swelling and  edema, and increasing erythema around the area at which point he  presented to Dr. Fletcher Anon office for evaluation.  He still denies any  fever or chills.  He still denies any nausea, vomiting, or bowel  changes.  He denies noticing any odorous changes to the wound.   PAST MEDICAL HISTORY:  1. Hypertension.  2. Diabetes mellitus, type 2.  3. Hyperlipidemia.   PAST SURGICAL HISTORY:  Right foot operation.   MEDICATIONS:  1. Verapamil.  2. Enalapril.  3. Dopamine.   ALLERGIES:  1. ASPIRIN.  2. PENICILLIN causes rash and swelling.   SOCIAL HISTORY:  No tobacco use.  He is a heavy alcohol user.  No  history of delirium tremens.   PHYSICAL EXAMINATION:  VITAL SIGNS:  Temperature is 98.7, heart rate 82,  respirations 16, and blood pressure 139/74.  GENERAL:  The patient is lying in the supine  position in his hospital  bed.  He is not in any acute distress.  He is alert and oriented x3.  He  is obese.  HEENT:  Pupils are equal, round, and reactive.  Extraocular movements  are intact.  No conjunctival pallor is noted.  NECK:  Trachea is midline.  No cervical lymphadenopathy.  PULMONARY:  He has unlabored respirations.  He is clear to auscultation  bilaterally.  CARDIOVASCULAR:  Regular rate and rhythm.  No murmurs or gallops are  apparent.  ABDOMEN:  Obese.  Soft, nontender, and nondistended.  EXTREMITIES:  He has 2+ radial pulses bilaterally.  He has 1+ dorsalis  pedis on the left, dorsalis pedis on the right is nonpalpable, and he  has a 1+ posterior tibialis on the right and on the left.  Extremities  are warm.  He has on his right leg, a approximate 2 x 4.5 cm necrotic  wound on the anterior aspect of his right inferior leg.  This has a foul  odor to  it and has cloudy serous drainage noted from it.  He has boggy  erythema inferior to the wound.  No specific crepitance is noted.  He  has exquisite pain around the wound.  He has positive erythema down to  the dorsal aspect of his foot.  He does not have any diminished range of  motion of his foot.  He has no pain noted with the range of motion or  pain with palpation of his foot.   PERTINENT LABORATORY DATA AND RADIOGRAPHIC STUDIES:  CBC 9.9, hemoglobin  15.8, hematocrit 46.2, and platelets 223.  Basic metabolic panel:  Sodium 128, potassium 3.3, chloride 90, and bicarb 30.  BUN 8 and  creatinine 0.87.  Blood glucose 137.   ASSESSMENT AND PLAN:  Necrotic dog bite to the right foot.  Based on the  physical findings, I have a high suspicion of an anaerobic infection of  this wound.  Necrotizing fasciitis is still a possibility, although no  significant crepitus is noted.  I did discuss with the patient the need  for urgent debridement of this wound, continued IV antibiotics with  vancomycin, and I will add Flagyl for  anaerobic coverage.  In addition,  the patient's history of dog bite and his history of diabetes mellitus  were discussed with the patient and at this point, again, I discussed  with the patient continuing IV fluid hydration and proceeding to the  operating room for debridement of this wound.  I did discuss with the  patient that should this to have progression that amputation may still  be required, however, at this time, I have no plans for amputation of  the extremity on this initial evaluation in the operating room.  I do  suspect, wound dressings will be required and possibly the utilization  of a wound VAC depending on the appearance of the wound following  operative debridement.  Risks, benefits, and alternatives of this  debridement were discussed with the patient and we will plan to proceed  to the operating room for the debridement.  This was also discussed with  Dr. Simone Curia.      Tilford Pillar, MD  Electronically Signed     BZ/MEDQ  D:  08/16/2007  T:  08/17/2007  Job:  [id number]   cc:   Tilford Pillar, MD  Fax: [phone]   W. Simone Curia, M.D.  Fax: [phone]

## 2010-05-27 NOTE — Op Note (Signed)
Dennis Mitchell, Dennis Mitchell               ACCOUNT NO.:  [id number]   MEDICAL RECORD NO.:  [id number]          PATIENT TYPE:  INP   LOCATION:  A332                          FACILITY:  [facility]   PHYSICIAN:  Tilford Pillar, MD      DATE OF BIRTH:  1950/08/28   DATE OF PROCEDURE:  DATE OF DISCHARGE:                               OPERATIVE REPORT   PREOPERATIVE DIAGNOSIS:  Right anterior leg necrotizing wound.   POSTOPERATIVE DIAGNOSIS:  Right anterior leg necrotizing wound.   PROCEDURE:  Operative wound debridement of right leg wound and  intraoperative wound VAC placement.   SURGEON:  Tilford Pillar, MD   ANESTHESIA:  Spinal with IV sedation.   ESTIMATED BLOOD LOSS:  Less than 100 mL.   SPECIMEN:  Debrided skin and soft tissue.   INDICATION:  The patient is a 60 year old male who was consulted on  after he was admitted to [facility] from Dr. Zack Seal  office.  Apparently, the patient had been bitten by their family Bertram Denver approximately 1 week ago, had initially been seen in the  emergency department, was treated with IV fluids, antibiotics, and wound  care and was discharged to home.  He did fairly well until approximately  24 hours ago when he developed increasing right leg pain and erythema.  On evaluation in this hospital room, he had significant necrosis with  the wound, foul odor, and boggy edema surrounding the wound.  No  crepitance was elicited, but based on the wound it was suspicious that  the patient could have an underlying necrotizing fasciitis.  Based on  this and the patient's diabetic status, it was discussed with the  patient the need for emergent debridement of the wound.  The risks,  benefits, and alternatives of which were discussed in length with the  patient.  The patient's questions and concerns were addressed and the  patient was consented for the planned procedure.   OPERATION:  The patient was taken to the operating room.  He was placed  in supine position on the operating table, at which time, __________ was  administered.  He was then placed in the sitting position at which time,  spinal anesthetic was administered.  Once the spinal anesthetic was  placed, the patient returned back to the supine position.  His leg was  prepped with Betadine solution and then draped in standard fashion.  The  wound was debrided circumferentially back to healthy appearing skin.  Surrounding necrotic tissue was excised down to most viable underlying  tissue.  Tendon was evident; however, no bone was exposed during the  dissection.  There was clear soft tissue coverage over anterior portion  of the tibia, although it could easily  be palpated underneath the soft  tissue.  Upon removal of the necrotic soft tissue, there was no evidence  of any tracking.  The hemostasis was obtained using electrocautery.  The  wound was then irrigated with copious amounts of sterile saline using a  pulse irrigator.  At this point in time, the wound was reinspected and  hemostasis checked and  a wound VAC was brought into the field.  The  black granular sponge was brought into the field, trimmed to the  appropriate size to fit the wound and was secured in standard fashion in  the leg.  The wound VAC in place, it was activated with a good seal.  The surrounding erythema was marked with a marking pen for baseline for  continued treatment and the patient was allowed to come out of sedation.  He was transferred back to regular hospital bed and transferred to  postanesthetic care unit in stable condition.  At the conclusion of the  procedure, all instruments, sponge, and needle counts were correct.  The  patient tolerated the procedure well.      Tilford Pillar, MD  Electronically Signed     BZ/MEDQ  D:  08/16/2007  T:  08/17/2007  Job:  [id number]

## 2010-05-27 NOTE — H&P (Signed)
Dennis Mitchell, Dennis Mitchell               ACCOUNT NO.:  [id number]   MEDICAL RECORD NO.:  [id number]          PATIENT TYPE:  INP   LOCATION:  A332                          FACILITY:  [facility]   PHYSICIAN:  Donna Bernard, M.D.DATE OF BIRTH:  [birth year]   DATE OF ADMISSION:  08/16/2007  DATE OF DISCHARGE:  LH                              HISTORY & PHYSICAL   CHIEF COMPLAINT:  Leg pain, tenderness.   SUBJECTIVE:  This patient is a 60 year old white male with history of  type 2 diabetes, chronic venous stasis, hypertension, chronic back pain,  hyperlipidemia, and chronic anxiety and depression, who presented to the  office of admission with ongoing concerns.  The patient was seen  approximately a week prior to admission with swelling, pain, and  ulceration and had a site of significant dog bite at that point.  He had  been seen in the emergency room.  They had Steri-Strips and started him  on Levaquin.  Unfortunately, the flaps at that time appeared plus/minus,  and some of the Steri-Strips extended over bare tissue.  At that point,  I advised the patient there is a big question as to whether or not the  flaps would heal.  Levaquin was maintained.  The patient was rechecked  on the day of admission with symptoms of worsening pain, swelling, and  tenderness.  The patient had noted a lot of warmth in the leg.  He did  not notice an odor.  He did notice that the pain was getting worse and  worse.  He had not noticed a true fever.   MEDICATIONS:  The patient claimed compliance with his medications, which  include:  1. Verapamil 240 mg q.a.m.  2. Vasotec 20 mg p.o. nightly.  3. Lozol 2.5 p.o. q.a.m.  4. Viagra 100 mg p.o. p.r.n.  5. Lovastatin 20 mg p.o. nightly.  6. Potassium chloride 20 mEq daily.  7. Celexa 20 mg q.a.m.   PRIOR SURGERIES:  None.   PRIOR MEDICAL HISTORY:  Significant for chronic problems as noted above.   FAMILY HISTORY:  Positive for hypertension, diabetes, lung  cancer.   ALLERGIES:  PENICILLIN with sensitivities to HYDROCODONE and CATAPRES.   SOCIAL HISTORY:  The patient is divorced.  Positive 1-pack per day  smoking habit.  Claims no alcohol intake.  He is chronically disabled at  this point.   REVIEW OF SYSTEMS:  Otherwise negative.   PHYSICAL EXAMINATION:  VITAL SIGNS:  BP 148/92, afebrile, alert, mild  malaise.  No acute distress.  Significant obesity noted.  HEENT:  Normal.  NECK:  Supple.  LUNGS:  Clear.  HEART:  Regular rhythm.  ABDOMEN:  Enlarged.  No discrete tenderness.  EXTREMITIES:  Trace edema bilaterally in the ankles, right anterior leg.  Very large ulceration with 2 flaps extending over the ulceration, which  now appear necrotic.  There is a distinct odor to the wound, and there  is a large surrounding zone of erythema and tenderness, exquisitely  tender to palpation.   SIGNIFICANT LABS:  White blood count normal, hemoglobin 15.8.  D-dimer  4.02.  Potassium  lowest at 3.3, glucose up to 137.  Liver enzymes good.  Renal function good.  Wound culture obtained.   IMPRESSION:  1. Dog bite with subsequent ulceration and now cellulitis with      underlying type 2 diabetes.  2. Hypertension, controlled.  3. Hyperlipidemia.   PLAN:  With worsening wound, this certainly impairs the need of IV  antibiotic therapy one with surgical consultation and intervention,  further orders as noted in the chart.  We will also need to address the  hypokalemia along with the patient's elevated D-dimer with an  ultrasound, further orders as noted in the chart.      Donna Bernard, M.D.  Electronically Signed     WSL/MEDQ  D:  08/18/2007  T:  08/19/2007  Job:  [id number]

## 2010-05-27 NOTE — Procedures (Signed)
DUPLEX DEEP VENOUS EXAM - LOWER EXTREMITY   INDICATION:  Right lower extremity swelling, however currently  asymptomatic.   HISTORY:  Edema:  Right lower extremity intermittently.  Trauma/Surgery:  Dog bite in right calf approximately 1 year ago.  Pain:  Right lower extremity intermittently.  PE:  No.  Previous DVT:  No.  Anticoagulants:  No.  Other:   DUPLEX EXAM:                CFV   SFV   PopV   PTV   GSV                R  L  R  L  R  L   R  L  R  L  Thrombosis    0  0  0     0      0     0  Spontaneous   +  +  +     +      +     +  Phasic        +  +  +     +      +     +  Augmentation  +  +  +     +      +     +  Compressible  +  +  +     +      +     +  Competent     +  D  +     +      +     +   Legend:  + - yes  o - no  p - partial  D - decreased   IMPRESSION:  1. No evidence of DVT or SVT in the right lower extremity or the left      common femoral vein.  2. Evidence of incompetent perforator in right distal calf.  3. Right deep and superficial veins show no evidence of significant      reflux.    _____________________________  V. Charlena Cross, MD   AS/MEDQ  D:  06/25/2008  T:  06/25/2008  Job:  [id number]

## 2010-05-27 NOTE — H&P (Signed)
Dennis Mitchell, Dennis Mitchell               ACCOUNT NO.:  [id number]   MEDICAL RECORD NO.:  [id number]          PATIENT TYPE:  AMB   LOCATION:  DAY                           FACILITY:  [facility]   PHYSICIAN:  Tilford Pillar, MD      DATE OF BIRTH:  04-27-1950   DATE OF ADMISSION:  DATE OF DISCHARGE:  LH                              HISTORY & PHYSICAL   CHIEF COMPLAINT:  Dog bite and wound to right leg.   HISTORY OF PRESENT ILLNESS:  The patient is a 60 year old male well  known to me who received a dog bite to his right anterior leg a couple  of months ago which required surgical debridement.  Since that time he  has undergone wound vac therapy to this area which has improved the  granulation tissue bed significantly in this area.  Initially this wound  was quite deep with an exposed tendon following this debridement.  He  has done well with continued treatment and utilization of the wound vac.  He has had no fever and chills.  No significant drainage from the area.  He does state occasional swelling distally in the foot which improves  with elevation.   PAST MEDICAL HISTORY:  1. Diabetes mellitus type 2.  2. Chronic venous stasis.  3. Hypertension.  4. Chronic lower back pain.  5. Hyperlipidemia.  6. Chronic anxiety.  7. History of depression.   PAST SURGICAL HISTORY:  He has had a previous right foot operation.   MEDICATIONS:  1. Verapamil.  2. Enalapril.  3. Dopamine.   ALLERGIES:  ASPIRIN and PENICILLIN.  Penicillin causes rash and  swelling.   SOCIAL HISTORY:  No tobacco use.  He does use alcohol with no history of  delirium tremens.   REVIEW OF SYSTEMS:  Unremarkable in all fields including,  CONSTITUTIONAL:  EYES, EARS, NOSE, and THROAT, RESPIRATORY,  CARDIOVASCULAR, GASTROINTESTINAL, GENITOURINARY, MUSCULOSKELETAL, SKIN  otherwise as per HPI.   PHYSICAL EXAMINATION:  GENERAL:  The patient is an obese, calm appearing  male in no acute distress.  He is alert and oriented x3.   He is clear to  auscultation.  PULMONARY:  Clear to auscultation and unlabored respirations.  CARDIOVASCULAR:  Regular rate and rhythm.  There are 2+ dorsalis pedis  pulses bilaterally.  EXTREMITIES:  On evaluation of extremity, he does have good range of  motion with his right foot.  There is an approximate 6 x 3.2 cm shallow  elliptical wound on his anterior surface of his right leg.  He has  excellent granulation tissue formation.  There is scant serous  discharge.  There is slight edema inferiorly to this, but no surrounding  erythema.   ASSESSMENT AND PLAN:  Right leg wound.  At this point, I do have  discussion with the patient in regards to continued dressing changes and  allowing re-epithelization from the periphery versus the possibility of  placement of split-thickness skin graft to allow coverage over this  area.  As the area is somewhat sizeable, I did discuss with the patient  duration of both options and at this  point, the patient's wishes to  proceed with the split-thickness skin graft.  The risks, benefits, and  alternatives were discussed, as well as the increase of pain associated  with donor site which at this time is planned from his right  anterolateral thigh.  This will be scheduled at the patient's earliest  convenience.  In the meantime the patient was instructed to continue wet-  to-dry dressings from the granulated site.      Tilford Pillar, MD  Electronically Signed     BZ/MEDQ  D:  09/30/2007  T:  09/30/2007  Job:  [id number]   cc:   Short Stay Surgery   W. Simone Curia, M.D.  Fax: [phone]

## 2010-05-27 NOTE — Op Note (Signed)
Dennis Mitchell, Dennis Mitchell               ACCOUNT NO.:  [id number]   MEDICAL RECORD NO.:  [id number]          PATIENT TYPE:  AMB   LOCATION:  DAY                           FACILITY:  [facility]   PHYSICIAN:  Tilford Pillar, MD      DATE OF BIRTH:  03-Mar-1950   DATE OF PROCEDURE:  01/23/2008  DATE OF DISCHARGE:                               OPERATIVE REPORT   PRE-PROCEDURE DIAGNOSIS:  Left frontal scalp lesion.   POST-PROCEDURE DIAGNOSIS:  Left frontal scalp lesion.   PROCEDURE:  Excisional biopsy of left frontal scalp lesion.   SURGEON:  Tilford Pillar, MD   ANESTHESIA:  1% Sensorcaine plain.   SPECIMEN:  Skin lesion for pathology as a permanent specimen.   ESTIMATED BLOOD LOSS:  Minimal.   INDICATIONS:  The patient is a 60 year old male well known to me as an  outpatient for continued observation of a wound on his right anterior  leg that has been healing by secondary intention after receiving a dog  bite in this area.  During his last outpatient evaluation, he did  mention a sore area on his left frontal scalp.  This occasionally causes  pain, it has been irritated, occasionally oozes, and has noted a small  keratinized area this is in the sun exposed area and is chronically  irritated and does have keratinized appearance to it, although my  suspicion of a squamous cell carcinoma is somewhat low.  I did discuss  with the patient the risks, benefits, and alternatives of an excisional  biopsy of this under local anesthesia for pathologic evaluation.  This  will be performed in the minor procedure room.  Risks including but not  limited to risk of bleeding and an infection were discussed with the  patient and the patient's questions and concerns were addressed.   OPERATIVE PROCEDURE:  The patient was taken to the minor procedure room  at [facility].  He was placed into a supine position.  He was  comfortable.  The area was prepped with Betadine solution and then  draped with  sterile green towels.  Local anesthetic was instilled, and  an elliptical transverse incision was created with a #15 blade scalpel  around the lesion down to the subcuticular tissue.  It was underscored  with the scalpel.  Once free, it was placed into formalin and sent as  permanent specimen.  Pressure was held in the area to obtain some  hemostasis and then a 4-0 nylon suture in a simple interrupted fashion  was utilized to reapproximate the skin edges.  This provided excellent  hemostasis.  A Band-Aid was placed over the area for final dressing, and  the drapes were  removed.  The patient was able to sit and then ambulate on his own  without any difficulties.  He tolerated the procedure well.  At the  conclusion of procedure, all sharps were disposed in a proper  accordance.  All instrument, sponge, and needle counts were correct.      Tilford Pillar, MD  Electronically Signed     BZ/MEDQ  D:  01/23/2008  T:  01/24/2008  Job:  [id number]

## 2010-05-27 NOTE — Assessment & Plan Note (Signed)
OFFICE VISIT   LEM, PEARY  DOB:  Mar 22, 1950                                       06/25/2008  BJYNW#:[id number]   REASON FOR VISIT:  Evaluate right leg ulcer.   HISTORY:  This is a 60 year old gentleman that I am seeing at the  request of Dr. Tilford Pillar for evaluation of right leg wound.  The  patient states in January 2009 he suffered a dog bite.  This got  infected and he underwent hospital admission with IV antibiotics and  operative debridement.  The wound has now healed; however, it did take  until March of this year to heal.  The patient has had significant  problems with some swelling in his leg since the incident.  The swelling  is worse after he has been up on his feet all day.  It is better in the  mornings and with rest.  He does not have any other nonhealing wounds.   The patient is borderline diabetic and suffers from hypertension and  hypercholesterolemia.  He also smokes a pack a day.   REVIEW OF SYSTEMS:  GENERAL:  Negative fevers, chills weight gain,  weight loss.  CARDIAC:  Positive shortness of breath with exertion.  PULMONARY:  Negative.  GI:  Negative.  GU:  Negative.  VASCULAR:  Negative.  NEUROLOGIC:  Negative.  ORTHOPEDIC:  Positive for arthritis.  PSYCHIATRIC:  Positive for depression.  ENT:  Negative.  HEMATOLOGIC:negative.   PAST MEDICAL HISTORY:  Diabetes, hypertension, hypercholesterolemia,  back pain, anxiety and depression.   PAST SURGICAL HISTORY:  Incision and drainage, right leg July 2009.   FAMILY HISTORY:  Noncontributory.   SOCIAL HISTORY:  He is single with 2 children.  He is a retired  Personnel officer.  Currently smokes 1 pack a day, drinks 3-4 beers per day.   MEDICATIONS:  Verapamil, Vasotec, Lozol, Viagra, lovastatin, potassium,  Celexa.   ALLERGIES:  None.   PHYSICAL EXAMINATION:  Blood pressure 136/90.  He is well-appearing, in  no acute distress.  Normocephalic, atraumatic.  Cardiovascular:   Regular  rate and rhythm.  Abdomen is obese.  Extremities are well-perfused.  I  cannot palpate pedal pulses.  The dog bite is well-healed.  He does have  pitting edema up to his calf.   DIAGNOSTIC STUDIES:  Venous ultrasound was performed today, which shows  no evidence of reflux.  There was no DVT.  There were no incompetent  perforators.   ASSESSMENT:  Lymphedema.   PLAN:  I had a long conversation with the patient regarding his  condition.  I think this will be something that he will deal with for  the rest of his wife.  Unfortunately, our treatment now is essentially  with compression and elevation.  I have given him a prescription for 20  to 30-mm compression stockings.  We did discuss massage therapy;  however, we will try the compression therapy first.  We talked about the  pathogenesis of this, if he continues to have the swelling this could  lead to ulcers and nonhealing wounds.  All of his questions were  answered today.   Jorge Ny, MD  Electronically Signed   VWB/MEDQ  D:  06/24/2008  T:  06/26/2008  Job:  [id number]   cc:   Tilford Pillar, MD  Donna Bernard, M.D.

## 2010-05-30 NOTE — Procedures (Signed)
Dennis Mitchell, Dennis Mitchell               ACCOUNT NO.:  [id number]   MEDICAL RECORD NO.:  [id number]          PATIENT TYPE:  INP   LOCATION:  A225                          FACILITY:  [facility]   PHYSICIAN:  Vida Roller, M.D.   DATE OF BIRTH:  [birth year]   DATE OF PROCEDURE:  11/07/2004  DATE OF DISCHARGE:                                    STRESS TEST   HISTORY:  [name] is a 60 year old male with no known coronary artery  disease and atypical chest discomfort.  His cardiac risk factors include  diabetes, hypertension, hyperlipidemia and is admitted to the hospital.  His  cardiac enzymes are negative x2 for acute myocardial infarction.   BASELINE DATA:  Electrocardiogram reveals a sinus rhythm at 63 beats a  minute with nonspecific ST abnormalities.  Blood pressure is 162/90.   The patient exercised for a total of 10 minutes with Bruce protocol, stage  IV and 12.8 METS.  Maximal heart rate was 140 beats per minute which is 84%  of predicted maximum.  Maximum blood pressure is 208/86 and resolved down to  162/82 in recovery.  The patient denied any chest discomfort.  He did have  shortness of breath and leg fatigue with exercise.  EKG revealed few PVCs.  No ischemic changes were noted.   Final images and results are pending M.D. review.      Jae Dire, P.A. [facility]      Vida Roller, M.D.  Electronically Signed    AB/MEDQ  D:  11/07/2004  T:  11/07/2004  Job:  [id number]

## 2010-05-30 NOTE — Discharge Summary (Signed)
Dennis Mitchell, Dennis Mitchell               ACCOUNT NO.:  [id number]   MEDICAL RECORD NO.:  [id number]          PATIENT TYPE:  INP   LOCATION:  A332                          FACILITY:  [facility]   PHYSICIAN:  Tilford Pillar, MD      DATE OF BIRTH:  [birth year]   DATE OF ADMISSION:  08/18/2007  DATE OF DISCHARGE:  08/07/2009LH                               DISCHARGE SUMMARY   ADMISSION DIAGNOSES:  Right leg pain and necrotizing wound status post  dog bite.   DISCHARGE DIAGNOSES:  1. Diabetes mellitus type 2.  2. Chronic venostasis.  3. Hypertension.  4. Chronic back pain.  5. Hyperlipidemia.  6. Chronic anxiety.  7. Depression.  8. Right leg wound status post debridement.   PROCEDURES:  Right leg operative soft tissue debridement on August 18, 2007.   ADMITTING PHYSICIAN:  Donna Bernard, MD   DISPOSITION:  Home.   BRIEF HISTORY AND PHYSICAL:  Please see the admission history and  physical for the complete H&P.  The patient is a 60 year old male with  multiple medical problems who approximately a week ago had a dog bite to  the right leg.  This was the family [name], did bite him.  He was initially seen in the Emergency Department and was treated and  had antibiotic started and Steri-Strip closure of the wound.  He had  been doing well.  He was continued on antibiotics and 24 hours before  admission noted increasing erythema and pain at which time, he presented  to Dr. Fletcher Anon office.  He was admitted as a direct admit for the wound  and for continued IV antibiotics.   HOSPITAL COURSE:  The patient was admitted on August 18, 2007, for the  right leg wound.  He was initially started on IV vancomycin.  Surgical  consultation by myself was obtained and due to the appearance of  necrotizing soft tissue infection around the wound, he was taken to the  operating room for operative debridement.  During the time of his  operation, a wound vac dressing was placed at the time of  his operation.  This was changed 24 hours later by myself with the evidence of good  granulation tissue formation.  Particular granulation tissue had started  to already form over the exposed tendon in his anterior leg.  He was  continued on an IV antibiotics.  His erythema around the wound continued  to improve.  Wound vac dressings again were changed 48 hours later again  demonstrating continued granulation tissue formation.  His pain was  controlled on oral pain medications and on August 19, 2007, plans were  made for discharge the patient to home with continued wound vac dressing  care and p.o. antibiotics and pain medication.   DISCHARGE INSTRUCTIONS:  The patient was instructed to he continue  activity as tolerated.  Wound care with continued wound vac and home  health assistance.  He may continue activity as tolerated.  He may  shower and replace dressing as needed.   DISCHARGE MEDICATIONS:  1. Verapamil 240 mg p.o. daily.  2. Vasotec 20 mg p.o. q.h.s.  3. Lozol 2.5 mg p.o. daily.  4. Viagra 100 mg p.o. p.r.n.  5. Lovastatin 20 mg p.o. q.h.s.  6. Potassium chloride 20 mEq p.o. daily.  7. Celexa 20 mg p.o. q.a.m.   NEW MEDICATIONS:  1. Lortab 5/500 one-to-two p.o. q.4 h. p.r.n. pain.  2. Augmentin 500 mg p.o. b.i.d. times 10-day course.   The patient is to followup with Dr. Gerda Diss as instructed and to follow  up with myself in 1 week for continued evaluation of the wound.      Tilford Pillar, MD  Electronically Signed     BZ/MEDQ  D:  08/22/2007  T:  08/22/2007  Job:  [id number]

## 2010-05-30 NOTE — H&P (Signed)
Dennis Mitchell, Dennis Mitchell               ACCOUNT NO.:  [id number]   MEDICAL RECORD NO.:  [id number]          PATIENT TYPE:  INP   LOCATION:  A225                          FACILITY:  [facility]   PHYSICIAN:  Scott A. Gerda Diss, MD    DATE OF BIRTH:  1950/01/29   DATE OF ADMISSION:  11/06/2004  DATE OF DISCHARGE:  LH                                HISTORY & PHYSICAL   CHIEF COMPLAINT:  Chest discomfort.   HISTORY OF PRESENT ILLNESS:  This is a 60 year old, white male who has  experienced approximately 2 days of chest pressure.  He states it is a  discomfort on the left side of his chest along the sternal border.  He  describes it as an ache and comes and goes and lasts anywhere from 10-20  minutes at a time.  No numbness or tingling associated with it.  No  shortness of breath, nausea or vomiting noted.  He states it sometimes  occurs with activity, sometimes with rest.  He is not a particularly active  person.  He has been under a lot of stress lately including a divorce that  has been stressing him out.  He does have quite a few risk factors for heart  disease.   PAST MEDICAL HISTORY:  1.  HTN.  2.  Type 2 diabetes.  3.  Hyperlipidemia.  4.  Erectile dysfunction.  5.  Peripheral venous stasis.  6.  Chronic back pain.   SOCIAL HISTORY:  Disabled, married, although going through divorce.   FAMILY HISTORY:  Pertinent for hypertension, diabetes.   ALLERGIES:  ASPIRIN causes hives.  PENICILLIN.   IMMUNIZATIONS:  The patient has received pneumonia vaccine in 2003.  He has  not received a flu vaccine for this year.   MEDICATIONS:  1.  Covera 240 mg one daily.  2.  Vasotec 20 mg daily.  3.  Lozol 2.5 mg daily.  4.  Viagra p.r.n.  5.  Lidocaine patch p.r.n.  6.  Lipitor 10 mg daily.  7.  Lipid profile from August 01, 2004, showed cholesterol 164, triglycerides      42, HDL 69, LDL 87.  A liver profile was normal.  A hemoglobin A1c at      that time was 6.2.  MET 7 from November 2005, showed a  BUN of 9,      creatinine 0.9.   PHYSICAL EXAMINATION:  GENERAL:  NAD.  HEENT:  TMs within normal limits.  NECK:  Supple with no masses.  CHEST:  CTA.  No crackles.  Chest wall nontender.  HEART:  Regular.  No gallop.  VITAL SIGNS:  BP 146/90.  ABDOMEN:  Soft, obese.  EXTREMITIES:  No edema.  SKIN:  Warm and dry.   LABORATORY DATA AND X-RAY FINDINGS:  EKG with poor R wave progression from  V1-V3.  The rest of EKG looked normal.   WBC 9.1, hemoglobin 15.8, hematocrit 45.6, neutrophils 74, lymphs 16,  platelets 270.  PTT 29, INR 1.0.  Sodium 131, potassium 3.0, chloride 95,  CO2 29, glucose 113, BUN 11, creatinine 0.9.  Liver enzymes  normal.  Cardiac  marker negative.  Troponin 0.02.   ASSESSMENT/PLAN:  Chest discomfort.  With all the patient's risks factors,  even though his HDL is very good, it is important to make sure we are not  dealing with underlying heart disease.  I feel we ought to rule out  overnight, plus also do a Cardiolite test tomorrow.  I feel that the patient  overall is stable currently and does not need a catheterization.  We will  consult [facility] to assist in this workup.  I feel the patient  should be able to go home if the Cardiolite looks good.  See orders.      Scott A. Gerda Diss, MD  Electronically Signed     SAL/MEDQ  D:  11/06/2004  T:  11/06/2004  Job:  [id number]

## 2010-05-30 NOTE — Discharge Summary (Signed)
Dennis Mitchell, Dennis Mitchell               ACCOUNT NO.:  [id number]   MEDICAL RECORD NO.:  [id number]          PATIENT TYPE:  INP   LOCATION:  A225                          FACILITY:  [facility]   PHYSICIAN:  Donna Bernard, M.D.DATE OF BIRTH:  1950/06/01   DATE OF ADMISSION:  11/06/2004  DATE OF DISCHARGE:  10/27/2006LH                                 DISCHARGE SUMMARY   DISCHARGE DIAGNOSES:  1.  Chest pain, myocardial infarction ruled out.  2.  Hypokalemia, improved, although not resolved.  3.  Hyperlipidemia.  4.  Hypertension.  5.  Depression/anxiety.   DISPOSITION:  The patient is discharged home.   DISCHARGE MEDICATIONS:  1.  Maintain all usual medicines.  2.  Xanax 0.5 mg p.o. nightly p.r.n. sleep.  3.  Potassium tablet 20 mEq one daily.   FOLLOW UP:  Follow up in the office in 2 weeks.   HISTORY OF PRESENT ILLNESS:  Please see H&P as dictated.   HOSPITAL COURSE:  This patient is a 60 year old male with a history of  hypertension, hyperlipidemia who presented to the office on the day of  admission with significant chest discomfort.  He described it at times an  ache and other times a deep pressure in the left anterior chest.  The  patient had some shortness of breath with this.  He notes a lot of increased  stress with currently going through a difficult divorce.  The patient claims  compliance with his medicines.  The pain occurs sometimes with deep breaths  and other time with exertion.  There is mild shortness of breath.  No nausea  or diaphoresis.  The patient was admitted to the hospital and serial cardiac  enzymes were negative.  Serial EKGs were stable.  Xanax was given nightly  p.r.n. for sleep.  We consulted the cardiology folks and they went ahead and  did a stress Cardiolite.  The result of this was completely normal.  At this  point tonight, the patient is feeling better.  He has minimal pain on exam.  His cardiopulmonary exam is stable.  The patient is discharged  home with  diagnosis and disposition as noted above.      Donna Bernard, M.D.  Electronically Signed    WSL/MEDQ  D:  11/07/2004  T:  11/07/2004  Job:  [id number]

## 2010-05-30 NOTE — Procedures (Signed)
NAMEBREES, Dennis               ACCOUNT NO.:  [id number]   MEDICAL RECORD NO.:  [id number]          PATIENT TYPE:  OUT   LOCATION:  SLEEP                         FACILITY:  [facility]   PHYSICIAN:  Kofi A. Gerilyn Pilgrim, M.D. DATE OF BIRTH:  Jun 22, 1950   DATE OF PROCEDURE:  05/11/2008  DATE OF DISCHARGE:                             SLEEP DISORDER REPORT   REFERRING PHYSICIAN:  Donna Bernard, MD   INDICATIONS:  This is a 60 year old man who presents with hypersomnia  witnessed apnea and snoring.  The patient is being evaluated for  obstructive sleep apnea syndrome.   MEDICATIONS:  Potassium, red yeast rice, verapamil, enalapril, and  Celexa.   EPWORTH SLEEPINESS:  Epworth sleepiness scale 15.  BMI 42.   ARCHITECTURAL SUMMARY:  The study was split with the first part being a  diagnostic and the second part a titration recording.  The total  recording time is 386 minutes.  Sleep efficiency 87%.  Sleep latency 12  minutes.  REM latency 160 minutes.   RESPIRATORY SUMMARY:  The baseline oxygen saturation is 96%.  Lowest  saturation 86% during the REM sleep.  The diagnostic AHI is 19.  The  patient was titrated between pressures of 6 and 14 with the optimal  pressure being 13, which resulted in elimination of events at higher  pressure.  There are were more central events.   LIMB MOVEMENT SUMMARY:  PLM index is 0.   ELECTROCARDIOGRAM SUMMARY:  Average heart rate is 67 with no significant  dysrhythmias observed.   IMPRESSION:  Moderate obstructive sleep apnea syndrome, which responds  well to a continuous positive airway pressure of 13.   Thanks for this referral.      Kofi A. Gerilyn Pilgrim, M.D.  Electronically Signed     KAD/MEDQ  D:  05/11/2008  T:  05/11/2008  Job:  [id number]

## 2010-06-12 ENCOUNTER — Emergency Department (HOSPITAL_COMMUNITY)
Admission: EM | Admit: 2010-06-12 | Discharge: 2010-06-12 | Disposition: A | Discharge status: Home / Self Care | Payer: Medicare Other | Attending: Emergency Medicine | Admitting: Emergency Medicine

## 2010-06-12 DIAGNOSIS — E119 Type 2 diabetes mellitus without complications: Secondary | ICD-10-CM | POA: Insufficient documentation

## 2010-06-12 DIAGNOSIS — E785 Hyperlipidemia, unspecified: Secondary | ICD-10-CM | POA: Insufficient documentation

## 2010-06-12 DIAGNOSIS — I10 Essential (primary) hypertension: Secondary | ICD-10-CM | POA: Insufficient documentation

## 2010-06-12 DIAGNOSIS — L509 Urticaria, unspecified: Secondary | ICD-10-CM | POA: Insufficient documentation

## 2010-07-26 ENCOUNTER — Emergency Department (HOSPITAL_COMMUNITY)
Admission: EM | Admit: 2010-07-26 | Discharge: 2010-07-26 | Disposition: A | Discharge status: Home / Self Care | Payer: Medicare Other | Attending: Emergency Medicine | Admitting: Emergency Medicine

## 2010-07-26 DIAGNOSIS — F411 Generalized anxiety disorder: Secondary | ICD-10-CM | POA: Insufficient documentation

## 2010-07-26 DIAGNOSIS — I10 Essential (primary) hypertension: Secondary | ICD-10-CM | POA: Insufficient documentation

## 2010-07-26 DIAGNOSIS — T148 Other injury of unspecified body region: Secondary | ICD-10-CM | POA: Insufficient documentation

## 2010-07-26 DIAGNOSIS — W57XXXA Bitten or stung by nonvenomous insect and other nonvenomous arthropods, initial encounter: Secondary | ICD-10-CM | POA: Insufficient documentation

## 2010-07-26 DIAGNOSIS — F17200 Nicotine dependence, unspecified, uncomplicated: Secondary | ICD-10-CM | POA: Insufficient documentation

## 2010-07-26 HISTORY — DX: Anxiety disorder, unspecified: F41.9

## 2010-07-26 HISTORY — DX: Essential (primary) hypertension: I10

## 2010-07-26 MED ORDER — MUPIROCIN CALCIUM 2 % EX CREA: TOPICAL_CREAM | Freq: Three times a day (TID) | CUTANEOUS | Status: AC

## 2010-07-26 NOTE — ED Provider Notes (Signed)
History     Chief Complaint  Patient presents with  . Insect Bite   HPI Comments: Pt complains of a "bump to the area behind the right ear.This was noted about 4 days ago. He feels this area is getting larger and somewhat more tender. No swelling of the external or internal ear. No change in hearing. No fever. No drainage reported. He has applied alcohol and neosporin, but feels the problem is getting worse. It is aggravated by palpation. No seem to resolve the problem at this time. Pt is concerned that he may have a spider bite or MRSA.   The history is provided by the patient.    Past Medical History  Diagnosis Date  . Hypertension   . Diabetes mellitus   . Anxiety     History reviewed. No pertinent past surgical history.  History reviewed. No pertinent family history.  History  Substance Use Topics  . Smoking status: Current Everyday Smoker -- 5.0 packs/day    Types: Cigars  . Smokeless tobacco: Not on file  . Alcohol Use: Yes      Review of Systems  Constitutional: Negative for activity change.       All ROS Neg except as noted in HPI  HENT: Negative for nosebleeds.   Eyes: Negative for photophobia and discharge.  Respiratory: Negative for shortness of breath and wheezing.   Cardiovascular: Negative for palpitations.  Gastrointestinal: Negative for blood in stool.  Genitourinary: Negative for dysuria, frequency and hematuria.  Musculoskeletal: Negative for back pain.  Skin:       itching  Neurological: Negative for dizziness, seizures and speech difficulty.  Psychiatric/Behavioral: Negative for hallucinations and confusion.    Physical Exam  BP 186/101  Pulse 75  Temp(Src) 97.6 F (36.4 C) (Oral)  Resp 20  Ht 5\' 7"  (1.702 m)  Wt 265 lb (120.203 kg)  BMI 41.50 kg/m2  SpO2 98%  Physical Exam  Nursing note and vitals reviewed. Constitutional: He is oriented to person, place, and time. He appears well-developed and well-nourished.  Non-toxic appearance.    HENT:  Head: Normocephalic.  Right Ear: Tympanic membrane normal.  Left Ear: Tympanic membrane and external ear normal.       Insect bite site behind the right ear. Mild to mod swelling. Outer ear not affected. No cartilage involved. No red streaks. No hives. Not hot.  Eyes: EOM and lids are normal. Pupils are equal, round, and reactive to light.  Neck: Normal range of motion. Neck supple. Carotid bruit is not present.  Cardiovascular: Normal rate, regular rhythm, normal heart sounds, intact distal pulses and normal pulses.   Pulmonary/Chest: Breath sounds normal. No respiratory distress.  Abdominal: Soft. Bowel sounds are normal. There is no tenderness. There is no guarding.  Musculoskeletal: Normal range of motion.  Lymphadenopathy:       Head (right side): No submandibular adenopathy present.       Head (left side): No submandibular adenopathy present.    He has no cervical adenopathy.  Neurological: He is alert and oriented to person, place, and time. He has normal strength. No cranial nerve deficit or sensory deficit.  Skin: Skin is warm and dry.  Psychiatric: He has a normal mood and affect. His speech is normal.    ED Course  Procedures  MDM I have reviewed nursing notes, vital signs, and all appropriate lab and imaging results for this patient.      Kathie Dike, [place] 07/26/10 1130  Kathie Dike, PA  08/15/10 1322 

## 2010-07-26 NOTE — Progress Notes (Signed)
No acute allergic reaction while in ED. This bite happen 7/11. Plan discussed with pt.

## 2010-07-26 NOTE — ED Notes (Signed)
Pt presents with bump behind right ear. Pt states he thinks he was bitten by a spider 4 days ago.

## 2010-08-01 NOTE — ED Provider Notes (Cosign Needed Addendum)
History     Chief Complaint  Patient presents with  . Insect Bite   HPI  Past Medical History  Diagnosis Date  . Hypertension   . Diabetes mellitus   . Anxiety     History reviewed. No pertinent past surgical history.  History reviewed. No pertinent family history.  History  Substance Use Topics  . Smoking status: Current Everyday Smoker -- 5.0 packs/day    Types: Cigars  . Smokeless tobacco: Not on file  . Alcohol Use: Yes      Review of Systems  Physical Exam  BP 186/101  Pulse 75  Temp(Src) 97.6 F (36.4 C) (Oral)  Resp 20  Ht 5\' 7"  (1.702 m)  Wt 265 lb (120.203 kg)  BMI 41.50 kg/m2  SpO2 98%  Physical Exam  ED Course  Procedures  MDM  Medical screening examination/treatment/procedure(s) were performed by non-physician practitioner and as supervising physician I was immediately available for consultation/collaboration. Devoria Albe, MD, FACEP      Ward Givens, MD 08/01/10 2351  Ward Givens, MD 08/01/10 [phone]

## 2010-08-19 NOTE — ED Provider Notes (Signed)
Medical screening examination/treatment/procedure(s) were performed by non-physician practitioner and as supervising physician I was immediately available for consultation/collaboration.  Ward Givens, MD 08/19/10 [phone]

## 2010-10-10 LAB — WOUND CULTURE
Culture: NO GROWTH
Gram Stain: NONE SEEN

## 2010-10-10 LAB — BASIC METABOLIC PANEL
BUN: 10
BUN: 12
BUN: 7
BUN: 8
CO2: 30
CO2: 33 — ABNORMAL HIGH
Calcium: 9
Calcium: 9.3
Chloride: 94 — ABNORMAL LOW
Chloride: 95 — ABNORMAL LOW
Chloride: 98
Creatinine, Ser: 0.82
Creatinine, Ser: 0.83
GFR calc Af Amer: >60
GFR calc non Af Amer: >60
GFR calc non Af Amer: >60
Glucose, Bld: 108 — ABNORMAL HIGH
Glucose, Bld: 115 — ABNORMAL HIGH
Glucose, Bld: 154 — ABNORMAL HIGH
Potassium: 3.2 — ABNORMAL LOW
Potassium: 3.7
Sodium: 131 — ABNORMAL LOW
Sodium: 134 — ABNORMAL LOW

## 2010-10-10 LAB — DIFFERENTIAL
Basophils Absolute: 0.1
Eosinophils Absolute: 0.3
Eosinophils Relative: 4
Lymphocytes Relative: 15
Lymphocytes Relative: 29
Lymphs Abs: 1.5
Lymphs Abs: 2.2
Monocytes Absolute: 0.7
Monocytes Relative: 9
Neutrophils Relative %: 76

## 2010-10-10 LAB — CBC
HCT: 39.8
Hemoglobin: 13.6
MCV: 91.8
Platelets: 214
Platelets: 223
WBC: 7.4
WBC: 9.9

## 2010-10-10 LAB — HEPATIC FUNCTION PANEL
ALT: 21
AST: 21
Indirect Bilirubin: 0.8
Total Protein: 7.2

## 2010-10-10 LAB — D-DIMER, QUANTITATIVE: D-Dimer, Quant: 4.02 — ABNORMAL HIGH

## 2010-11-23 IMAGING — CR DG CHEST 1V PORT
1 series · 1 of 1 positions shown · non-contrast
Comparison: 12/24/2008

CLINICAL DATA: Fall

PORTABLE CHEST - 1 VIEW

[view not recorded]
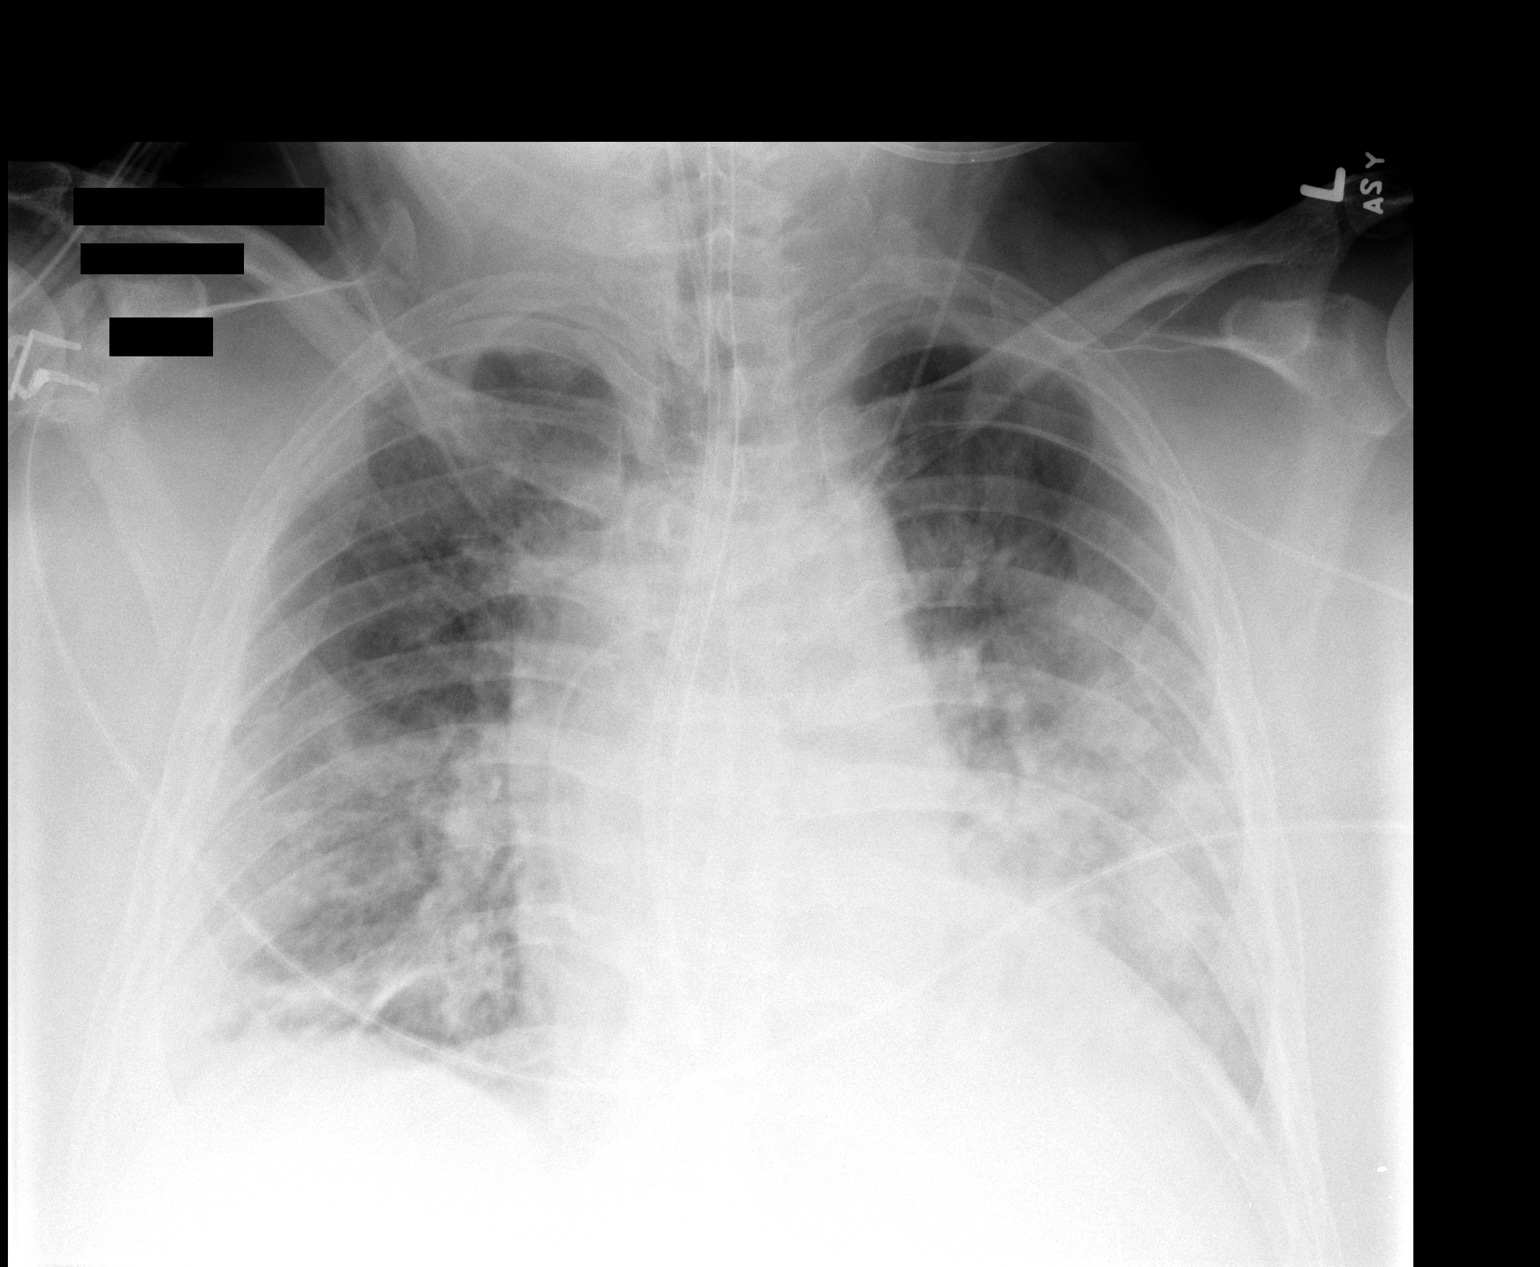

[1 of 1 positions shown; findings below may reference images not displayed]

FINDINGS: Stable tubular structures.  Diffuse airspace disease has
improved within the right lung has improved and within the left
lung has worsened.  No pneumothorax.
IMPRESSION: Bilateral airspace disease with mixed interval change.  Improved on
the right but worse on the left.

## 2010-11-24 IMAGING — CR DG CHEST 1V PORT
1 series · 1 of 1 positions shown · non-contrast
Comparison: 12/25/2008

CLINICAL DATA: Fall, rib fractures

PORTABLE CHEST - 1 VIEW

[view not recorded]
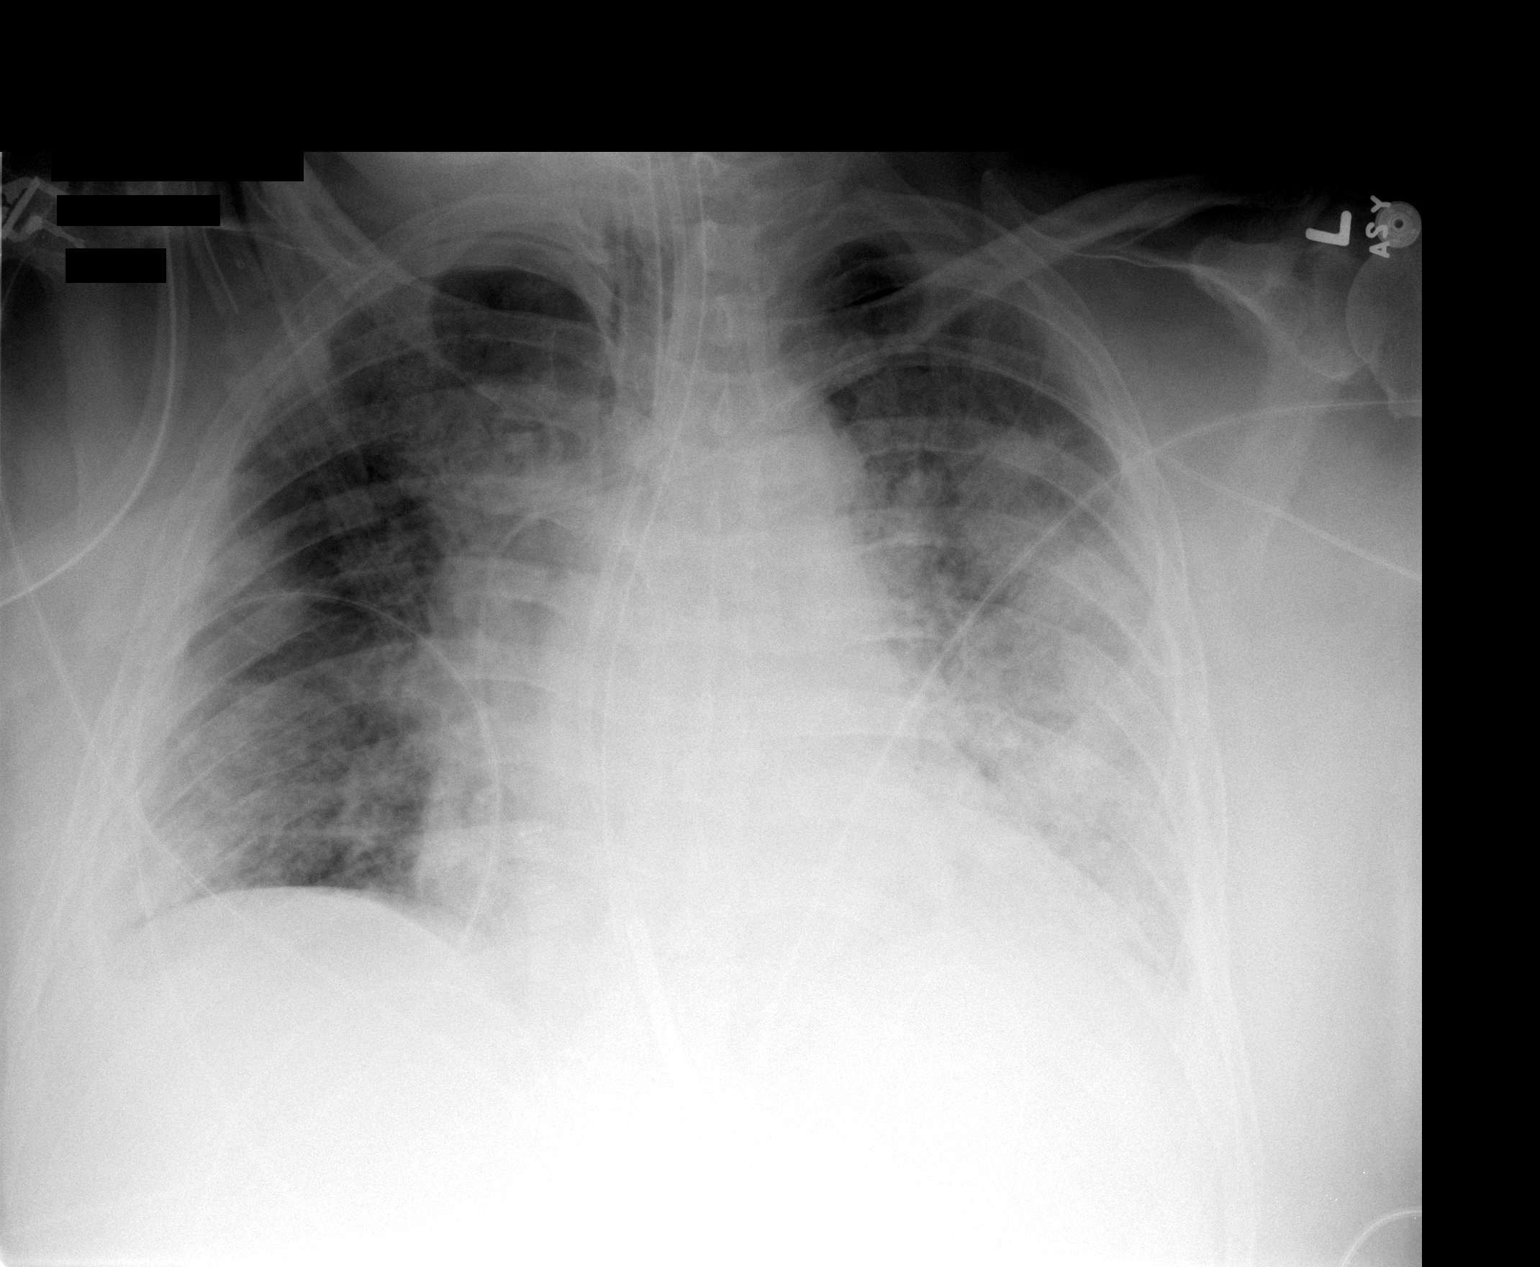

[1 of 1 positions shown; findings below may reference images not displayed]

FINDINGS: Feeding tube tip is noted in the proximal stomach.
Otherwise, support devices remain in place, unchanged.  Bilateral
airspace disease, left greater than right, stable.  Stable
cardiomegaly.
IMPRESSION: No change in appearance of the lungs.

Feeding tube tip is in the proximal stomach.

## 2010-11-27 IMAGING — CR DG CHEST 1V PORT
1 series · 1 of 1 positions shown · non-contrast
Comparison: Earlier film of the same day

CLINICAL DATA: Dyspnea, unresponsive

PORTABLE CHEST - 1 VIEW

[view not recorded]
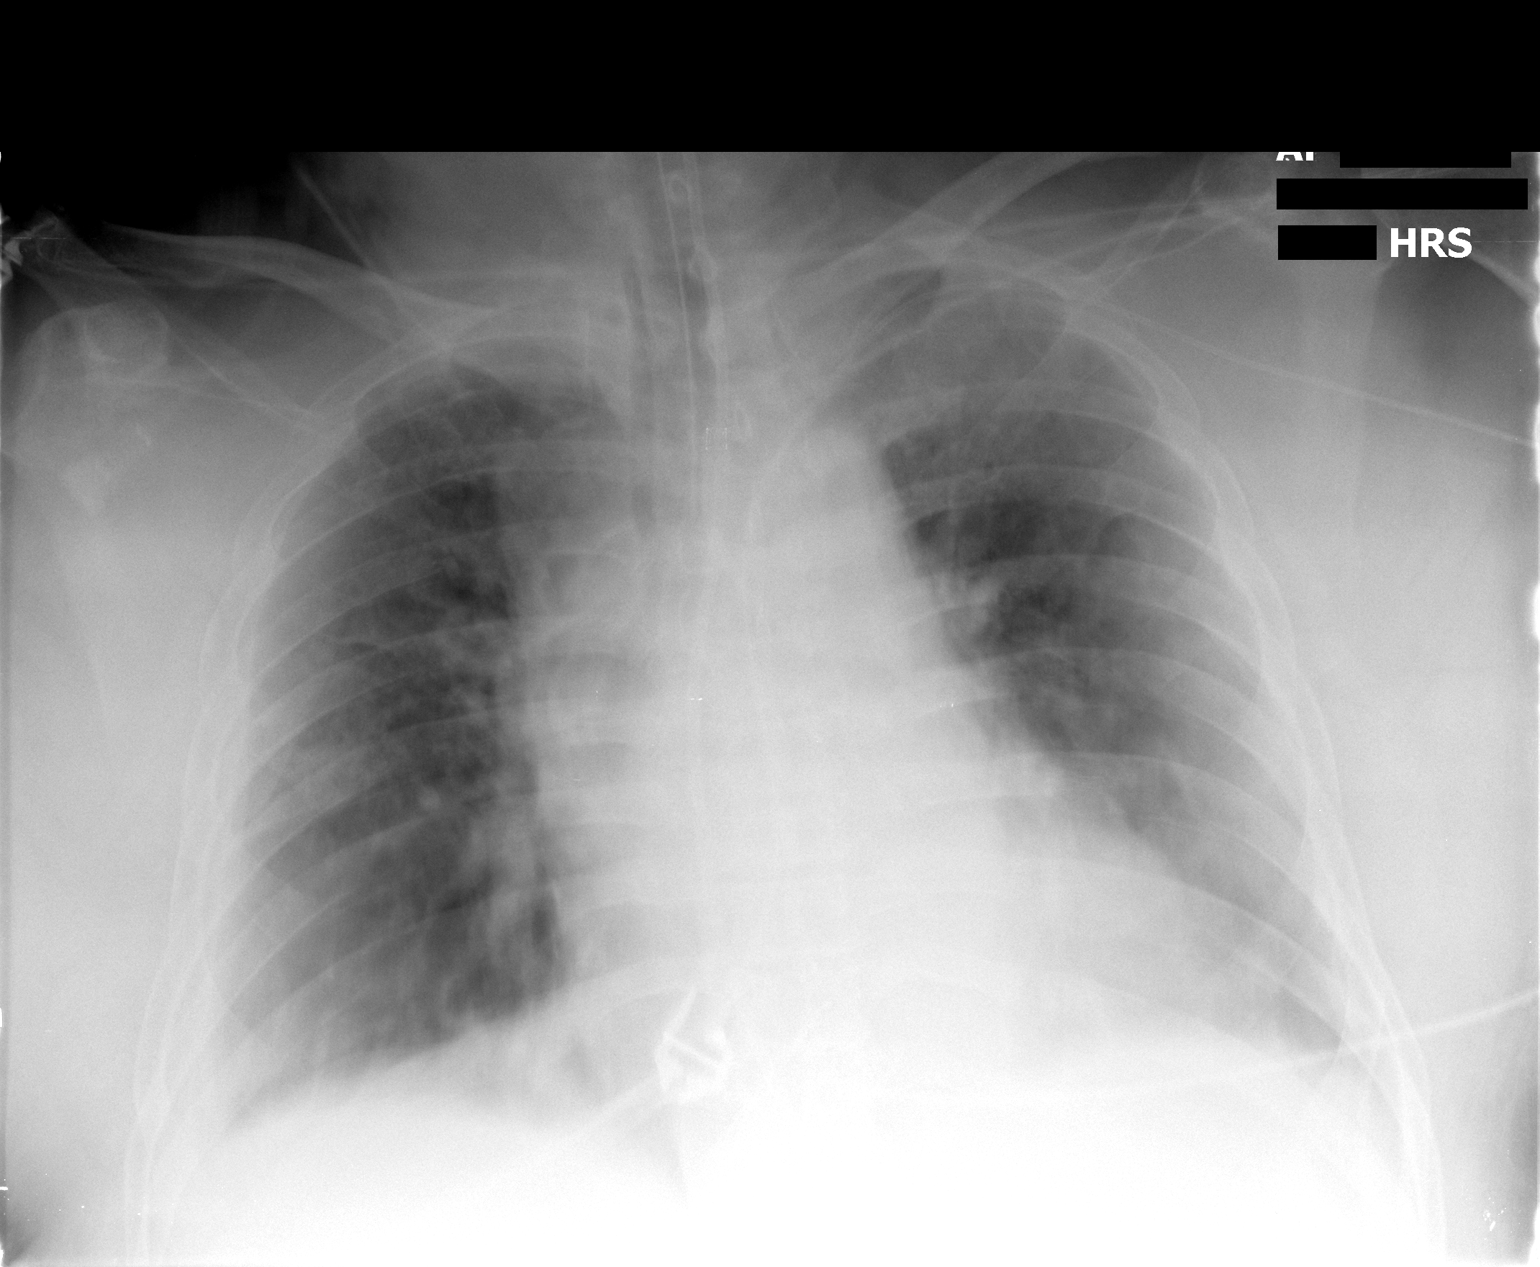

[1 of 1 positions shown; findings below may reference images not displayed]

FINDINGS: Endotracheal tube, feeding tube, and left central line
are stable in position.  Patient motion degrades fine detail.
Stable cardiomegaly.  Bilateral infiltrates or edema without
convincing interval change.  Probable right pleural effusion.
IMPRESSION: 1.  No convincing interval change from earlier film,   exam
degraded by patient motion.
2. Support hardware stable in position.

## 2010-11-29 IMAGING — CR DG CHEST 1V PORT
1 series · 1 of 1 positions shown · non-contrast
Comparison: Portable chest x-ray earlier today 8650 hours.

CLINICAL DATA: Tracheostomy tube placement.

PORTABLE CHEST - 1 VIEW [DATE]/7343 4746 hours:

[view not recorded]
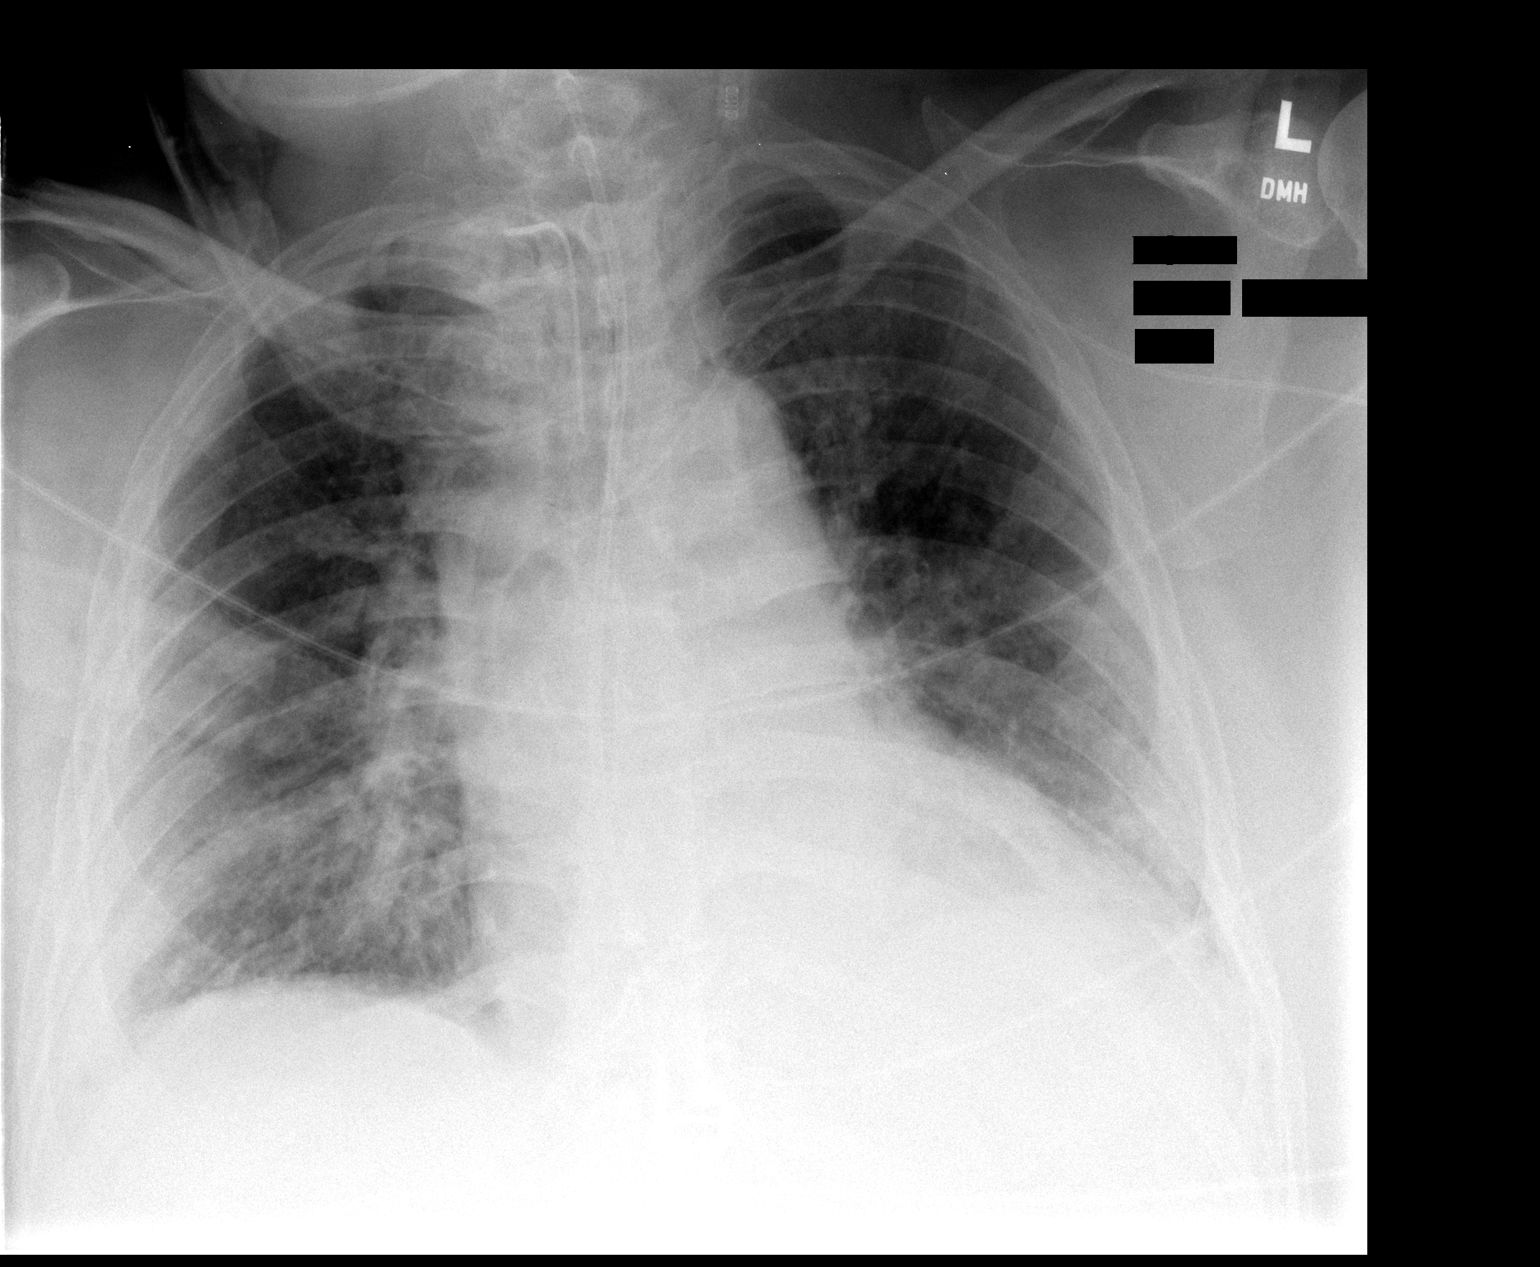

[1 of 1 positions shown; findings below may reference images not displayed]

FINDINGS: New tracheostomy tube tip in satisfactory position
approximately 4-5 cm above the carina.  Left arm PICC tip remains
in the SVC.  Feeding tube courses below the diaphragm.  No
pneumothorax.  No pneumomediastinum.  Patchy airspace opacities in
both lungs, right greater than left, unchanged.
IMPRESSION: 1.  New tracheostomy tube tip in satisfactory position
approximately 4-5 cm above the carina.  No acute complicating
features.
2.  Stable patchy pneumonia and/or contusions in both lungs, right
greater than left.

## 2010-12-16 IMAGING — CR DG CHEST 1V PORT
1 series · 1 of 1 positions shown · non-contrast
Comparison: 01/07/2009

CLINICAL DATA: Fall, trauma

PORTABLE CHEST - 1 VIEW

[view not recorded]
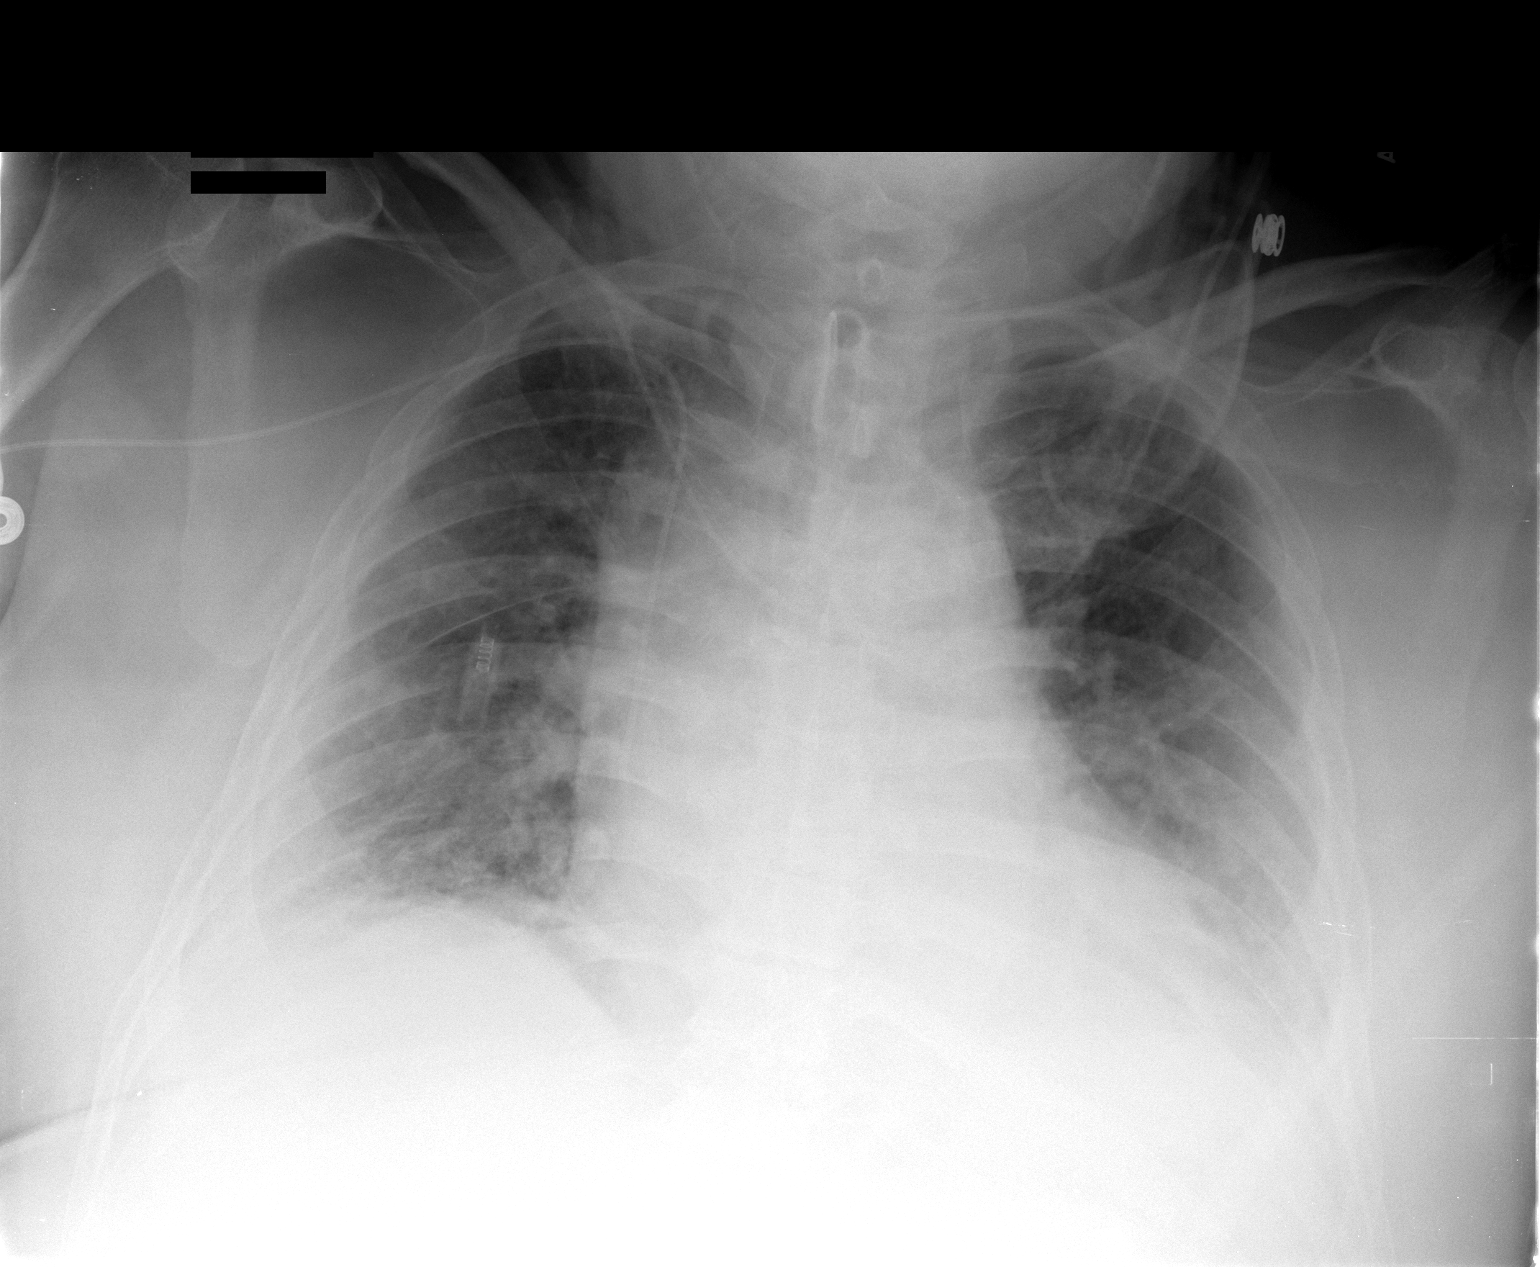

[1 of 1 positions shown; findings below may reference images not displayed]

FINDINGS: Stable support apparatus.  Heart remains enlarged with
vascular congestion, mild edema and basilar atelectasis.  No
significant interval change.  No pneumothorax.
IMPRESSION: Stable mild edema pattern.

## 2011-01-01 ENCOUNTER — Emergency Department (HOSPITAL_COMMUNITY): Payer: Medicare Other

## 2011-01-01 ENCOUNTER — Encounter (HOSPITAL_COMMUNITY): Payer: Self-pay

## 2011-01-01 ENCOUNTER — Other Ambulatory Visit: Payer: Self-pay

## 2011-01-01 ENCOUNTER — Inpatient Hospital Stay (HOSPITAL_COMMUNITY): Payer: Medicare Other

## 2011-01-01 ENCOUNTER — Inpatient Hospital Stay (HOSPITAL_COMMUNITY)
Admission: EM | Admit: 2011-01-01 | Discharge: 2011-01-03 | DRG: 915 | Disposition: A | Discharge status: Home / Self Care | Payer: Medicare Other | Attending: Internal Medicine | Admitting: Internal Medicine

## 2011-01-01 DIAGNOSIS — E119 Type 2 diabetes mellitus without complications: Secondary | ICD-10-CM

## 2011-01-01 DIAGNOSIS — E7800 Pure hypercholesterolemia, unspecified: Secondary | ICD-10-CM | POA: Diagnosis present

## 2011-01-01 DIAGNOSIS — I959 Hypotension, unspecified: Secondary | ICD-10-CM

## 2011-01-01 DIAGNOSIS — N179 Acute kidney failure, unspecified: Secondary | ICD-10-CM | POA: Diagnosis present

## 2011-01-01 DIAGNOSIS — J329 Chronic sinusitis, unspecified: Secondary | ICD-10-CM | POA: Diagnosis present

## 2011-01-01 DIAGNOSIS — J189 Pneumonia, unspecified organism: Secondary | ICD-10-CM

## 2011-01-01 DIAGNOSIS — I10 Essential (primary) hypertension: Secondary | ICD-10-CM | POA: Diagnosis present

## 2011-01-01 DIAGNOSIS — T782XXA Anaphylactic shock, unspecified, initial encounter: Secondary | ICD-10-CM

## 2011-01-01 DIAGNOSIS — T68XXXA Hypothermia, initial encounter: Secondary | ICD-10-CM

## 2011-01-01 DIAGNOSIS — F411 Generalized anxiety disorder: Secondary | ICD-10-CM | POA: Diagnosis present

## 2011-01-01 DIAGNOSIS — J449 Chronic obstructive pulmonary disease, unspecified: Secondary | ICD-10-CM | POA: Diagnosis present

## 2011-01-01 DIAGNOSIS — D72829 Elevated white blood cell count, unspecified: Secondary | ICD-10-CM | POA: Diagnosis present

## 2011-01-01 DIAGNOSIS — F17200 Nicotine dependence, unspecified, uncomplicated: Secondary | ICD-10-CM | POA: Diagnosis present

## 2011-01-01 DIAGNOSIS — N19 Unspecified kidney failure: Secondary | ICD-10-CM

## 2011-01-01 DIAGNOSIS — I9589 Other hypotension: Secondary | ICD-10-CM | POA: Diagnosis present

## 2011-01-01 DIAGNOSIS — J90 Pleural effusion, not elsewhere classified: Secondary | ICD-10-CM

## 2011-01-01 DIAGNOSIS — T7840XA Allergy, unspecified, initial encounter: Principal | ICD-10-CM | POA: Diagnosis present

## 2011-01-01 DIAGNOSIS — J4489 Other specified chronic obstructive pulmonary disease: Secondary | ICD-10-CM | POA: Diagnosis present

## 2011-01-01 HISTORY — DX: Pure hypercholesterolemia, unspecified: E78.00

## 2011-01-01 LAB — URINALYSIS, ROUTINE W REFLEX MICROSCOPIC
Bilirubin Urine: NEGATIVE
Glucose, UA: NEGATIVE mg/dL
Leukocytes, UA: NEGATIVE
Nitrite: NEGATIVE
Protein, ur: >300 mg/dL — AB
Specific Gravity, Urine: 1.03 (ref 1.005–1.030)
Urobilinogen, UA: 0.2 mg/dL (ref 0.0–1.0)
pH: 5.5 (ref 5.0–8.0)

## 2011-01-01 LAB — BASIC METABOLIC PANEL
BUN: 29 mg/dL — ABNORMAL HIGH (ref 6–23)
BUN: 19 mg/dL (ref 6–23)
Calcium: 8.6 mg/dL (ref 8.4–10.5)
Chloride: 95 mEq/L — ABNORMAL LOW (ref 96–112)
Creatinine, Ser: 1.42 mg/dL — ABNORMAL HIGH (ref 0.50–1.35)
Creatinine, Ser: 0.9 mg/dL (ref 0.50–1.35)
GFR calc Af Amer: 61 mL/min — ABNORMAL LOW (ref >90)
GFR calc Af Amer: >90 mL/min (ref >90)
GFR calc non Af Amer: 52 mL/min — ABNORMAL LOW (ref >90)
GFR calc non Af Amer: >90 mL/min (ref >90)
Glucose, Bld: 272 mg/dL — ABNORMAL HIGH (ref 70–99)
Glucose, Bld: 249 mg/dL — ABNORMAL HIGH (ref 70–99)
Potassium: 4.4 mEq/L (ref 3.5–5.1)

## 2011-01-01 LAB — DIFFERENTIAL
Basophils Absolute: 0 10*3/uL (ref 0.0–0.1)
Basophils Relative: 0 % (ref 0–1)
Eosinophils Absolute: 0.2 10*3/uL (ref 0.0–0.7)
Monocytes Absolute: 0.7 10*3/uL (ref 0.1–1.0)
Monocytes Relative: 3 % (ref 3–12)
Neutro Abs: 21.4 10*3/uL — ABNORMAL HIGH (ref 1.7–7.7)

## 2011-01-01 LAB — GLUCOSE, CAPILLARY
Glucose-Capillary: 191 mg/dL — ABNORMAL HIGH (ref 70–99)
Glucose-Capillary: 292 mg/dL — ABNORMAL HIGH (ref 70–99)
Glucose-Capillary: 199 mg/dL — ABNORMAL HIGH (ref 70–99)

## 2011-01-01 LAB — CBC
HCT: 52.6 % — ABNORMAL HIGH (ref 39.0–52.0)
Hemoglobin: 18.6 g/dL — ABNORMAL HIGH (ref 13.0–17.0)
MCH: 32.2 pg (ref 26.0–34.0)
MCHC: 35.4 g/dL (ref 30.0–36.0)
MCV: 91 fL (ref 78.0–100.0)
Platelets: 199 10*3/uL (ref 150–400)
RBC: 5.78 MIL/uL (ref 4.22–5.81)
RDW: 13.5 % (ref 11.5–15.5)
WBC: 25.7 10*3/uL — ABNORMAL HIGH (ref 4.0–10.5)

## 2011-01-01 LAB — LACTIC ACID, PLASMA: Lactic Acid, Venous: 3 mmol/L — ABNORMAL HIGH (ref 0.5–2.2)

## 2011-01-01 LAB — POCT I-STAT TROPONIN I: Troponin i, poc: 0.04 ng/mL (ref 0.00–0.08)

## 2011-01-01 LAB — RAPID HIV SCREEN (WH-MAU): Rapid HIV Screen: NONREACTIVE

## 2011-01-01 LAB — D-DIMER, QUANTITATIVE: D-Dimer, Quant: 10.21 ug/mL-FEU — ABNORMAL HIGH (ref 0.00–0.48)

## 2011-01-01 LAB — PROTIME-INR
INR: 1.01 (ref 0.00–1.49)
Prothrombin Time: 13.5 seconds (ref 11.6–15.2)

## 2011-01-01 LAB — URINE MICROSCOPIC-ADD ON

## 2011-01-01 LAB — HEMOGLOBIN A1C: Hgb A1c MFr Bld: 4.2 % (ref <5.7)

## 2011-01-01 LAB — MRSA PCR SCREENING: MRSA by PCR: NEGATIVE

## 2011-01-01 MED ORDER — LEVOFLOXACIN IN D5W 750 MG/150ML IV SOLN: 750.0000 mg | INTRAVENOUS | Status: DC

## 2011-01-01 MED ORDER — LEVOFLOXACIN IN D5W 500 MG/100ML IV SOLN
500.0000 mg | Freq: Once | INTRAVENOUS | Status: AC
  Administered 2011-01-01: 500 mg via INTRAVENOUS
  Filled 2011-01-01: qty 100

## 2011-01-01 MED ORDER — LEVOFLOXACIN IN D5W 250 MG/50ML IV SOLN
250.0000 mg | Freq: Once | INTRAVENOUS | Status: AC
  Administered 2011-01-01: 250 mg via INTRAVENOUS
  Filled 2011-01-01: qty 50

## 2011-01-01 MED ORDER — LEVOFLOXACIN IN D5W 750 MG/150ML IV SOLN
750.0000 mg | INTRAVENOUS | Status: DC
  Administered 2011-01-02: 750 mg via INTRAVENOUS
  Filled 2011-01-01 (×4): qty 150

## 2011-01-01 MED ORDER — ALPRAZOLAM 0.5 MG PO TABS: 0.5000 mg | ORAL_TABLET | Freq: Every evening | ORAL | Status: DC | PRN

## 2011-01-01 MED ORDER — HYDROXYZINE HCL 25 MG PO TABS: 25.0000 mg | ORAL_TABLET | Freq: Three times a day (TID) | ORAL | Status: DC | PRN

## 2011-01-01 MED ORDER — FAMOTIDINE 20 MG PO TABS
20.0000 mg | ORAL_TABLET | Freq: Every day | ORAL | Status: DC
  Administered 2011-01-01 – 2011-01-03 (×3): 20 mg via ORAL
  Filled 2011-01-01 (×3): qty 1

## 2011-01-01 MED ORDER — EPINEPHRINE HCL 1 MG/ML IJ SOLN
0.3000 mg | Freq: Once | INTRAMUSCULAR | Status: AC
  Administered 2011-01-01: 0.3 mg via INTRAMUSCULAR

## 2011-01-01 MED ORDER — ALBUTEROL SULFATE (5 MG/ML) 0.5% IN NEBU: 2.5000 mg | INHALATION_SOLUTION | RESPIRATORY_TRACT | Status: DC | PRN

## 2011-01-01 MED ORDER — INSULIN ASPART 100 UNIT/ML ~~LOC~~ SOLN
SUBCUTANEOUS | Status: AC
  Filled 2011-01-01: qty 3

## 2011-01-01 MED ORDER — VANCOMYCIN HCL 1000 MG IV SOLR
1250.0000 mg | Freq: Two times a day (BID) | INTRAVENOUS | Status: DC
  Administered 2011-01-01 – 2011-01-02 (×3): 1250 mg via INTRAVENOUS
  Filled 2011-01-01 (×8): qty 1250

## 2011-01-01 MED ORDER — ALBUTEROL SULFATE (5 MG/ML) 0.5% IN NEBU
2.5000 mg | INHALATION_SOLUTION | Freq: Four times a day (QID) | RESPIRATORY_TRACT | Status: DC
  Administered 2011-01-01: 2.5 mg via RESPIRATORY_TRACT
  Filled 2011-01-01: qty 0.5

## 2011-01-01 MED ORDER — SODIUM CHLORIDE 0.9 % IV BOLUS (SEPSIS)
1000.0000 mL | Freq: Once | INTRAVENOUS | Status: AC
  Administered 2011-01-01: 1000 mL via INTRAVENOUS

## 2011-01-01 MED ORDER — SODIUM CHLORIDE 0.9 % IJ SOLN
INTRAMUSCULAR | Status: AC
  Filled 2011-01-01: qty 6

## 2011-01-01 MED ORDER — INSULIN ASPART 100 UNIT/ML ~~LOC~~ SOLN
0.0000 [IU] | Freq: Three times a day (TID) | SUBCUTANEOUS | Status: DC
  Administered 2011-01-01: 11 [IU] via SUBCUTANEOUS
  Administered 2011-01-02: 4 [IU] via SUBCUTANEOUS
  Administered 2011-01-02: 11 [IU] via SUBCUTANEOUS
  Administered 2011-01-02: 7 [IU] via SUBCUTANEOUS

## 2011-01-01 MED ORDER — VANCOMYCIN HCL IN DEXTROSE 1-5 GM/200ML-% IV SOLN
1000.0000 mg | INTRAVENOUS | Status: AC
  Administered 2011-01-01 (×2): 1000 mg via INTRAVENOUS
  Filled 2011-01-01 (×2): qty 200

## 2011-01-01 MED ORDER — SODIUM CHLORIDE 0.9 % IV SOLN
INTRAVENOUS | Status: DC
  Administered 2011-01-01 (×2): via INTRAVENOUS

## 2011-01-01 MED ORDER — METHYLPREDNISOLONE SODIUM SUCC 125 MG IJ SOLR
60.0000 mg | Freq: Two times a day (BID) | INTRAMUSCULAR | Status: DC
  Administered 2011-01-01 – 2011-01-02 (×3): 60 mg via INTRAVENOUS
  Filled 2011-01-01 (×3): qty 2

## 2011-01-01 MED ORDER — HEPARIN SODIUM (PORCINE) 5000 UNIT/ML IJ SOLN
5000.0000 [IU] | Freq: Three times a day (TID) | INTRAMUSCULAR | Status: DC
  Administered 2011-01-01 – 2011-01-03 (×6): 5000 [IU] via SUBCUTANEOUS
  Filled 2011-01-01 (×6): qty 1

## 2011-01-01 MED ORDER — INSULIN ASPART 100 UNIT/ML ~~LOC~~ SOLN
0.0000 [IU] | Freq: Every day | SUBCUTANEOUS | Status: DC
  Administered 2011-01-02: 3 [IU] via SUBCUTANEOUS

## 2011-01-01 MED ORDER — SODIUM CHLORIDE 0.9 % IV SOLN
Freq: Once | INTRAVENOUS | Status: AC
  Administered 2011-01-01: 06:00:00 via INTRAVENOUS

## 2011-01-01 MED ORDER — CITALOPRAM HYDROBROMIDE 20 MG PO TABS
40.0000 mg | ORAL_TABLET | Freq: Every day | ORAL | Status: DC
  Administered 2011-01-01 – 2011-01-03 (×3): 40 mg via ORAL
  Filled 2011-01-01 (×3): qty 2

## 2011-01-01 MED ORDER — EPINEPHRINE HCL 1 MG/ML IJ SOLN
INTRAMUSCULAR | Status: AC
  Administered 2011-01-01: 0.3 mg via INTRAMUSCULAR
  Filled 2011-01-01: qty 1

## 2011-01-01 NOTE — ED Notes (Signed)
Admitting physician at bedside for evaluation 

## 2011-01-01 NOTE — Progress Notes (Signed)
ANTIBIOTIC CONSULT NOTE - INITIAL  Pharmacy Consult for Vancomycin Indication: CAP  Allergies  Allergen Reactions  . Aspirin   . Penicillins Hives    Patient Measurements: Height: 5\' 6"  (167.6 cm) Weight: 265 lb (120.203 kg) IBW/kg (Calculated) : 63.8  Adjusted Body Weight:90 kg Vital Signs: Temp: 99.3 F (37.4 C) (12/20 0634) Temp src: Core (Comment) (12/20 0634) BP: 107/53 mmHg (12/20 0634) Pulse Rate: 113  (12/20 0634) Intake/Output from previous day: 12/19 0701 - 12/20 0700 In: -  Out: 125 [Urine:125] Intake/Output from this shift:    Labs:  [facility] 01/01/11 0304  WBC 25.7*  HGB 18.6*  PLT 199  LABCREA --  CREATININE 1.42*   Estimated Creatinine Clearance: 67.6 ml/min (by C-G formula based on Cr of 1.42).    Microbiology: Recent Results (from the past 720 hour(s))  CULTURE, BLOOD (ROUTINE X 2)     Status: Normal (Preliminary result)   Collection Time   01/01/11  3:09 AM      Component Value Range Status Comment   Specimen Description LEFT ANTECUBITAL   Final    Special Requests BOTTLES DRAWN AEROBIC AND ANAEROBIC 4CC   Final    Culture PENDING   Incomplete    Report Status PENDING   Incomplete   CULTURE, BLOOD (ROUTINE X 2)     Status: Normal (Preliminary result)   Collection Time   01/01/11  3:55 AM      Component Value Range Status Comment   Specimen Description Blood LEFT ARM   Final    Special Requests NONE [facility]   Final    Culture PENDING   Incomplete    Report Status PENDING   Incomplete     Medical History: Past Medical History  Diagnosis Date  . Hypertension   . Diabetes mellitus   . Anxiety   . Hypercholesteremia     Medications:  Scheduled:    . sodium chloride   Intravenous Once  . citalopram  40 mg Oral Daily  . EPINEPHrine  0.3 mg Intramuscular Once  . famotidine  20 mg Oral Daily  . heparin  5,000 Units Subcutaneous Q8H  . insulin aspart  0-20 Units Subcutaneous TID WC  . insulin aspart  0-5 Units Subcutaneous QHS  .  levofloxacin (LEVAQUIN) IV  750 mg Intravenous Q24H  . methylPREDNISolone (SOLU-MEDROL) injection  60 mg Intravenous Q12H  . sodium chloride  1,000 mL Intravenous Once  . sodium chloride  1,000 mL Intravenous Once  . sodium chloride  1,000 mL Intravenous Once  . sodium chloride  1,000 mL Intravenous Once  . vancomycin  1,250 mg Intravenous Q12H   Assessment: Empiric therapy for CAP.   Goal of Therapy:  Vancomycin trough level 15-20 mcg/ml  Plan:  Measure antibiotic drug levels at steady state Follow up culture results Vancomycin 2 grams x 1 followed by 1250 mg IV every 12 hours. Obtain trough on 01-03-11 at 900 a.m.  Gilman Buttner, [place] J 01/01/2011,8:31 AM

## 2011-01-01 NOTE — ED Provider Notes (Addendum)
History     CSN: [id number] Arrival date & time: 01/01/2011  2:50 AM   First MD Initiated Contact with Patient 01/01/11 0253      Chief Complaint  Patient presents with  . Allergic Reaction  . Hypotension     Patient is a 60 y.o. male presenting with allergic reaction. The history is provided by the patient, a significant other and the EMS personnel.  Allergic Reaction The primary symptoms are  diarrhea, dizziness, rash and angioedema. Primary symptoms comment: syncope The current episode started less than 1 hour ago. The problem has been rapidly worsening. This is a new problem.  Pt presents from home via EMS Reportedly woke up with rash, took benadryl then felt weak, he had diarrhea, and was not able to move EMS unable to obtain IV access but he was hypotensive  Past Medical History  Diagnosis Date  . Hypertension   . Diabetes mellitus   . Anxiety   . Hypercholesteremia     History reviewed. No pertinent past surgical history.  No family history on file.  History  Substance Use Topics  . Smoking status: Current Everyday Smoker -- 5.0 packs/day    Types: Cigars  . Smokeless tobacco: Not on file  . Alcohol Use: Yes      Review of Systems  Unable to perform ROS: Unstable vital signs  Gastrointestinal: Positive for diarrhea.  Skin: Positive for rash.  Neurological: Positive for dizziness.    Allergies  Aspirin and Penicillins  Home Medications   Current Outpatient Rx  Name Route Sig Dispense Refill  . ALPRAZOLAM 0.5 MG PO TABS Oral Take 0.5 mg by mouth at bedtime as needed.      Marland Kitchen CITALOPRAM HYDROBROMIDE 20 MG PO TABS Oral Take 20 mg by mouth daily.      . ENALAPRIL MALEATE 10 MG PO TABS Oral Take by mouth daily.      Marland Kitchen HYDROCHLOROTHIAZIDE 12.5 MG PO CAPS Oral Take by mouth daily.      Marland Kitchen LISINOPRIL 20 MG PO TABS Oral Take by mouth daily.        BP 106/60  Pulse 80  Temp(Src) 93.7 F (34.3 C) (Rectal)  Resp 16  Ht 5\' 6"  (1.676 m)  Wt 265 lb (120.203  kg)  BMI 42.77 kg/m2  SpO2 90% BP 128/84  Pulse 94  Temp(Src) 98.4 F (36.9 C) (Core (Comment))  Resp 20  Ht 5\' 6"  (1.676 m)  Wt 265 lb (120.203 kg)  BMI 42.77 kg/m2  SpO2 98%   Physical Exam  CONSTITUTIONAL:  ill appearing HEAD AND FACE: Normocephalic/atraumatic EYES: EOMI/PERRL ENMT: Mucous membranes dry, upper lip is edematous, tongue size normal NECK: supple no meningeal signs SPINE:entire spine nontender CV: S1/S2 noted, no murmurs/rubs/gallops noted LUNGS: Lungs are clear to auscultation bilaterally, no apparent distress ABDOMEN: soft, nontender, no rebound or guarding, obese NEURO: Pt is sleeping but arousable, maex4 EXTREMITIES: pulses normal, full ROM SKIN: erythema noted to abdominal wall and his back PSYCH: no abnormalities of mood noted   ED Course  Procedures   CRITICAL CARE Performed by: Joya Gaskins   Total critical care time: 32  Critical care time was exclusive of separately billable procedures and treating other patients.  Critical care was necessary to treat or prevent imminent or life-threatening deterioration.  Critical care was time spent personally by me on the following activities: development of treatment plan with patient and/or surrogate as well as nursing, discussions with consultants, evaluation of patient's response to treatment, examination  of patient, obtaining history from patient or surrogate, ordering and performing treatments and interventions, ordering and review of laboratory studies, ordering and review of radiographic studies, pulse oximetry and re-evaluation of patient's condition.     Labs Reviewed  BASIC METABOLIC PANEL  CBC  DIFFERENTIAL  URINALYSIS, ROUTINE W REFLEX MICROSCOPIC  URINE CULTURE  I-STAT TROPONIN I  LACTIC ACID, PLASMA  CULTURE, BLOOD (ROUTINE X 2)  CULTURE, BLOOD (ROUTINE X 2)  POCT CBG MONITORING   Results for orders placed during the hospital encounter of 01/01/11  BASIC METABOLIC PANEL       Component Value Range   Sodium 135  135 - 145 (mEq/L)   Potassium 3.1 (*) 3.5 - 5.1 (mEq/L)   Chloride 95 (*) 96 - 112 (mEq/L)   CO2 25  19 - 32 (mEq/L)   Glucose, Bld 272 (*) 70 - 99 (mg/dL)   BUN 29 (*) 6 - 23 (mg/dL)   Creatinine, Ser 1.19 (*) 0.50 - 1.35 (mg/dL)   Calcium 8.5  8.4 - 14.7 (mg/dL)   GFR calc non Af Amer 52 (*) >90 (mL/min)   GFR calc Af Amer 61 (*) >90 (mL/min)  CBC      Component Value Range   WBC 25.7 (*) 4.0 - 10.5 (K/uL)   RBC 5.78  4.22 - 5.81 (MIL/uL)   Hemoglobin 18.6 (*) 13.0 - 17.0 (g/dL)   HCT 82.9 (*) 56.2 - 52.0 (%)   MCV 91.0  78.0 - 100.0 (fL)   MCH 32.2  26.0 - 34.0 (pg)   MCHC 35.4  30.0 - 36.0 (g/dL)   RDW 13.0  86.5 - 78.4 (%)   Platelets 199  150 - 400 (K/uL)  DIFFERENTIAL      Component Value Range   Neutrophils Relative 84 (*) 43 - 77 (%)   Neutro Abs 21.4 (*) 1.7 - 7.7 (K/uL)   Lymphocytes Relative 13  12 - 46 (%)   Lymphs Abs 3.3  0.7 - 4.0 (K/uL)   Monocytes Relative 3  3 - 12 (%)   Monocytes Absolute 0.7  0.1 - 1.0 (K/uL)   Eosinophils Relative 1  0 - 5 (%)   Eosinophils Absolute 0.2  0.0 - 0.7 (K/uL)   Basophils Relative 0  0 - 1 (%)   Basophils Absolute 0.0  0.0 - 0.1 (K/uL)   WBC Morphology INCREASED BANDS (>20% BANDS)    CULTURE, BLOOD (ROUTINE X 2)      Component Value Range   Specimen Description LEFT ANTECUBITAL     Special Requests BOTTLES DRAWN AEROBIC AND ANAEROBIC 4CC     Culture PENDING     Report Status PENDING    POCT I-STAT TROPONIN I      Component Value Range   Troponin i, poc 0.04  0.00 - 0.08 (ng/mL)   Comment 3           GLUCOSE, CAPILLARY      Component Value Range   Glucose-Capillary 191 (*) 70 - 99 (mg/dL)   Dg Chest Port 1 View  01/01/2011  *RADIOLOGY REPORT*  Clinical Data: Weakness, hypotension  PORTABLE CHEST - 1 VIEW  Comparison: 01/17/2009  Findings: Heart size upper normal limits to mildly enlarged.  Small right and likely trace left pleural effusions with associated opacities; atelectasis  versus infiltrate.  No pneumothorax.  No acute osseous abnormality.  IMPRESSION: Small right and likely trace left pleural effusions with associated opacities; atelectasis versus infiltrate.  Original Report Authenticated By: Waneta Martins, M.D.  3:09 AM On arrival, pt hypotensive, hypothermic, appears to have some rash and upper lip is edematous His fiance arrived, and she stated that his lips are swollen, he woke up with rash, took benadryl.  This has happened before with allergic type rxn.  He also had diarrhea at home as well I will give a dose of epinephrine as suspicion for anaphylaxis is high  3:40 AM BP improved Temp improved Pt more awake IV fluids infusing   4:34 AM BP now drifting down Continue IV hydration He does have elevated WBC, ?infiltrate on CXR Will start antibiotics  5:04 AM BP improving with IV fluids He has no new complaints, he is awake/alert Will call for admission  5:55 AM D/w dr Rito Ehrlich, to admit to stepdown  NOTE- The clinical picture was not suggestive of pneumonia, which resulted in a delay of the diagnosis of pneumonia at the time of admission.   MDM  Nursing notes reviewed and considered in documentation All labs/vitals reviewed and considered Previous records reviewed and considered xrays reviewed and considered       Date: 01/01/2011  Rate: 96  Rhythm: normal sinus rhythm  QRS Axis: normal  Intervals: normal  ST/T Wave abnormalities: nonspecific ST changes  Conduction Disutrbances:none  Narrative Interpretation:   Old EKG Reviewed: unchanged      Joya Gaskins, MD 01/01/11 1610  Joya Gaskins, MD 01/01/11 [phone]

## 2011-01-01 NOTE — ED Notes (Signed)
Dr Bebe Shaggy made aware of current vitals. Order to d/c warming blanket, and decrease rate to 163ml/HR received and initiated.  Small amount of urine noted to foley bag, not enough for sample. No distress noted. Will continue to monitor patient.

## 2011-01-01 NOTE — ED Notes (Signed)
Lab at bedside for redraw labs. Patient aaox3, temp now 97.3 per temp foley. Family at bedside. Will continue to monitor

## 2011-01-01 NOTE — ED Notes (Signed)
Lab at bedside to redraw latic acid.

## 2011-01-01 NOTE — Progress Notes (Signed)
Subjective: The patient denies any chest pain, or shortness of breath denies any nausea or vomiting, itching is improved, noticed some drop in oxygen saturation. Patient with ongoing tobacco abuse, history reviewed  Objective: Vital signs in last 24 hours: Filed Vitals:   01/01/11 0603 01/01/11 0605 01/01/11 0634 01/01/11 0830  BP: 139/89 139/89 107/53 144/81  Pulse: 109 113 113 97  Temp:  98.9 F (37.2 C) 99.3 F (37.4 C) 97.6 F (36.4 C)  TempSrc:  Core (Comment) Core (Comment) Oral  Resp:  18 19 17   Height:      Weight:      SpO2: 98% 100% 99% 92%   Weight change:   Intake/Output Summary (Last 24 hours) at 01/01/11 1610 Last data filed at 01/01/11 0522  Gross per 24 hour  Intake      0 ml  Output    125 ml  Net   -125 ml   Obese gentleman lying on bed not on respiratory distress noticed some lips swelling mild superficial rash on the right side of his belly,not diffuse. Neck obese no lymphadenopathy, no edema, heart S1 and S2 with no added sounds, lung normal breathing with no rails or wheezing, abdomen distended very OBs with umbilicus hernia repair to his and will bowel sounds present nontender and no organomegaly  Extremities with chronic venous stasis and chronic scars form dog bite   ab Results:  Basename 01/01/11 0304  NA 135  K 3.1*  CL 95*  CO2 25  GLUCOSE 272*  BUN 29*  CREATININE 1.42*  CALCIUM 8.5  MG --  PHOS --   No results found for this basename: AST:2,ALT:2,ALKPHOS:2,BILITOT:2,PROT:2,ALBUMIN:2 in the last 72 hours No results found for this basename: LIPASE:2,AMYLASE:2 in the last 72 hours  Basename 01/01/11 0304  WBC 25.7*  NEUTROABS 21.4*  HGB 18.6*  HCT 52.6*  MCV 91.0  PLT 199   No results found for this basename: CKTOTAL:3,CKMB:3,CKMBINDEX:3,TROPONINI:3 in the last 72 hours No components found with this basename: POCBNP:3 No results found for this basename: DDIMER:2 in the last 72 hours No results found for this basename: HGBA1C:2 in  the last 72 hours No results found for this basename: CHOL:2,HDL:2,LDLCALC:2,TRIG:2,CHOLHDL:2,LDLDIRECT:2 in the last 72 hours No results found for this basename: TSH,T4TOTAL,FREET3,T3FREE,THYROIDAB in the last 72 hours No results found for this basename: VITAMINB12:2,FOLATE:2,FERRITIN:2,TIBC:2,IRON:2,RETICCTPCT:2 in the last 72 hours  Micro Results: Recent Results (from the past 240 hour(s))  CULTURE, BLOOD (ROUTINE X 2)     Status: Normal (Preliminary result)   Collection Time   01/01/11  3:09 AM      Component Value Range Status Comment   Specimen Description LEFT ANTECUBITAL   Final    Special Requests BOTTLES DRAWN AEROBIC AND ANAEROBIC 4CC   Final    Culture PENDING   Incomplete    Report Status PENDING   Incomplete   CULTURE, BLOOD (ROUTINE X 2)     Status: Normal (Preliminary result)   Collection Time   01/01/11  3:55 AM      Component Value Range Status Comment   Specimen Description Blood LEFT ARM   Final    Special Requests NONE [facility]   Final    Culture PENDING   Incomplete    Report Status PENDING   Incomplete     Studies/Results: Dg Chest Port 1 View  01/01/2011  *RADIOLOGY REPORT*  Clinical Data: Weakness, hypotension  PORTABLE CHEST - 1 VIEW  Comparison: 01/17/2009  Findings: Heart size upper normal limits to mildly enlarged.  Small right and likely trace left pleural effusions with associated opacities; atelectasis versus infiltrate.  No pneumothorax.  No acute osseous abnormality.  IMPRESSION: Small right and likely trace left pleural effusions with associated opacities; atelectasis versus infiltrate.  Original Report Authenticated By: Waneta Martins, M.D.    Medications: I have reviewed the patient's current medications. Scheduled Meds:   . sodium chloride   Intravenous Once  . albuterol  2.5 mg Nebulization Q6H  . citalopram  40 mg Oral Daily  . EPINEPHrine  0.3 mg Intramuscular Once  . famotidine  20 mg Oral Daily  . heparin  5,000 Units Subcutaneous Q8H    . insulin aspart  0-20 Units Subcutaneous TID WC  . insulin aspart  0-5 Units Subcutaneous QHS  . levofloxacin  500 mg Intravenous Once  . levofloxacin (LEVAQUIN) IV  750 mg Intravenous Q24H  . methylPREDNISolone (SOLU-MEDROL) injection  60 mg Intravenous Q12H  . sodium chloride  1,000 mL Intravenous Once  . sodium chloride  1,000 mL Intravenous Once  . sodium chloride  1,000 mL Intravenous Once  . sodium chloride  1,000 mL Intravenous Once  . vancomycin  1,250 mg Intravenous Q12H  . vancomycin  1,000 mg Intravenous Q1 Hr x 2   Continuous Infusions:   . sodium chloride     PRN Meds:.ALPRAZolam, hydrOXYzine  Assessment/Plan: 1-this is a 60 year old male presented with hypotension, some onset of rash, leukocytosis with bandemia and hypotension with treat as area sepsis and possible allergic reaction, would repeat chest x-ray in the morning, agree with IV fluid hydration, blood culture pending and urine culture pending, would add vancomycin IV, patient has episode of hypoxia during conversation I would start nebs treatment and check d-dimer Will start tapering Solu-Medrol in the morning 2-hypokalemia would replace 3-hypotension currently resolved 4-leukocytosis with bandemia was treated as a case of infection secondary to pneumonia with Levaquin and would add vancomycin pending blood culture. Woul would repeat chest x-ray  LOS: 0 days  Pearlie Nies I. 01/01/2011, 9:42 AM

## 2011-01-01 NOTE — ED Notes (Addendum)
Transferred to room -  Other half of Solu-Medrol vial labeled and sent to unit with patient.  VR

## 2011-01-01 NOTE — ED Notes (Signed)
Patient's color improving. edp at bedside. Orders received for temp foley received and initiated.

## 2011-01-01 NOTE — H&P (Signed)
Dennis Mitchell is an 60 y.o. male.    PCP: Lilyan Punt, MD, MD   Chief Complaint: Weakness  HPI: This is a 60 year old, Caucasian male, with a past medical history of, diabetes, obesity, hypertension, and history of allergies, who was in his usual state of health till earlier today. At about 1:30 or so patient started feeling weak. He started having itching all over. He noticed a redness across his skin all over. His fiance noticed that his lips were swelling up. Patient lost color, and he felt like he was pass out. However, he did not have a syncopal episode. EMS was called and the patient was brought into the hospital. EMS found a low blood pressure at home. Patient denies being on any new medications, except for the fact, that he's using electric cigarettes to help him quit smoking. He has been having some cough and congestion type symptoms for the last few days. His fiance was sick with the chills and congestion last week. He denies being on any antibiotics. He said he's had a few episodes of severe allergic reactions in the past. He's been to an allergist. However, that was many, many years ago. He's not sure if he was recommended to carry an epinephrine pen. There is no history of any fever. The onset of symptomatology was quite sudden. There is no real precipitating factor identified. No aggravating or relieving factors identified. Patient is feeling much better currently. According to his fiance his lip swelling has come down significantly. His rash is still there. However, it appears, better too.  Prior to Admission medications   Medication Sig Start Date End Date Taking? Authorizing Provider  amLODipine (NORVASC) 5 MG tablet Take 5 mg by mouth daily.     Yes Historical Provider, MD  hydrOXYzine (ATARAX/VISTARIL) 25 MG tablet Take 25 mg by mouth 3 (three) times daily as needed.     Yes Historical Provider, MD  metFORMIN (GLUCOPHAGE) 1000 MG tablet Take 1,000 mg by mouth 2 (two) times  daily with a meal.     Yes Historical Provider, MD  ranitidine (ZANTAC) 300 MG tablet Take 300 mg by mouth daily.     Yes Historical Provider, MD  ALPRAZolam Prudy Feeler) 0.5 MG tablet Take 0.5 mg by mouth at bedtime as needed.      Historical Provider, MD  citalopram (CELEXA) 20 MG tablet Take 40 mg by mouth daily.     Historical Provider, MD  hydrochlorothiazide (,MICROZIDE/HYDRODIURIL,) 12.5 MG capsule Take 25 mg by mouth daily.     Historical Provider, MD  lisinopril (PRINIVIL,ZESTRIL) 20 MG tablet Take by mouth 2 (two) times daily.     Historical Provider, MD    Allergies:  Allergies  Allergen Reactions  . Aspirin   . Penicillins Hives    Past Medical History  Diagnosis Date  . Hypertension   . Diabetes mellitus   . Anxiety   . Hypercholesteremia     Past Surgical History  Procedure Date  . Tracheostomy 2010    Social History:  reports that he has been smoking Cigars.  He does not have any smokeless tobacco history on file. He reports that he drinks alcohol. He reports that he does not use illicit drugs.  Family History: Diabetes, and cancer   Review of Systems - History obtained from the patient and friends and fiancee General ROS: positive for  - fatigue Psychological ROS: negative Ophthalmic ROS: negative Allergy and Immunology ROS: positive for - hives Hematological and Lymphatic ROS: negative  Respiratory ROS: positive for - cough Cardiovascular ROS: negative Gastrointestinal ROS: positive for - nausea/vomiting Genito-Urinary ROS: negative Musculoskeletal ROS: negative Neurological ROS: positive for - dizziness Dermatological ROS: positive for pruritus and rash  Physical Examination Blood pressure 139/89, pulse 113, temperature 98.9 F (37.2 C), temperature source Core (Comment), resp. rate 18, height 5\' 6"  (1.676 m), weight 120.203 kg (265 lb), SpO2 100.00%.  General appearance: alert, cooperative, appears stated age, moderately obese and slowed  mentation Head: Normocephalic, without obvious abnormality, atraumatic Eyes: conjunctivae/corneas clear. PERRL, EOM's intact. Fundi benign. Throat: lips slightly swollen. Tongue is normal Neck: no adenopathy, no carotid bruit, no JVD, supple, symmetrical, trachea midline and thyroid not enlarged, symmetric, no tenderness/mass/nodules Resp: diminished breath sounds bibasilar Cardio: regular rate and rhythm, S1, S2 normal, no murmur, click, rub or gallop GI: soft, non-tender; bowel sounds normal; no masses,  no organomegaly Pulses: 2+ and symmetric Skin: erythema - generalized and macular - generalized Lymph nodes: Cervical, supraclavicular, and axillary nodes normal. Neurologic: Grossly normal  Results for orders placed during the hospital encounter of 01/01/11 (from the past 48 hour(s))  BASIC METABOLIC PANEL     Status: Abnormal   Collection Time   01/01/11  3:04 AM      Component Value Range Comment   Sodium 135  135 - 145 (mEq/L)    Potassium 3.1 (*) 3.5 - 5.1 (mEq/L)    Chloride 95 (*) 96 - 112 (mEq/L)    CO2 25  19 - 32 (mEq/L)    Glucose, Bld 272 (*) 70 - 99 (mg/dL)    BUN 29 (*) 6 - 23 (mg/dL)    Creatinine, Ser 0.45 (*) 0.50 - 1.35 (mg/dL)    Calcium 8.5  8.4 - 10.5 (mg/dL)    GFR calc non Af Amer 52 (*) >90 (mL/min)    GFR calc Af Amer 61 (*) >90 (mL/min)   CBC     Status: Abnormal   Collection Time   01/01/11  3:04 AM      Component Value Range Comment   WBC 25.7 (*) 4.0 - 10.5 (K/uL)    RBC 5.78  4.22 - 5.81 (MIL/uL)    Hemoglobin 18.6 (*) 13.0 - 17.0 (g/dL)    HCT 40.9 (*) 81.1 - 52.0 (%)    MCV 91.0  78.0 - 100.0 (fL)    MCH 32.2  26.0 - 34.0 (pg)    MCHC 35.4  30.0 - 36.0 (g/dL)    RDW 91.4  78.2 - 95.6 (%)    Platelets 199  150 - 400 (K/uL)   DIFFERENTIAL     Status: Abnormal   Collection Time   01/01/11  3:04 AM      Component Value Range Comment   Neutrophils Relative 84 (*) 43 - 77 (%)    Neutro Abs 21.4 (*) 1.7 - 7.7 (K/uL)    Lymphocytes Relative 13   12 - 46 (%)    Lymphs Abs 3.3  0.7 - 4.0 (K/uL)    Monocytes Relative 3  3 - 12 (%)    Monocytes Absolute 0.7  0.1 - 1.0 (K/uL)    Eosinophils Relative 1  0 - 5 (%)    Eosinophils Absolute 0.2  0.0 - 0.7 (K/uL)    Basophils Relative 0  0 - 1 (%)    Basophils Absolute 0.0  0.0 - 0.1 (K/uL)    WBC Morphology INCREASED BANDS (>20% BANDS)     LACTIC ACID, PLASMA     Status: Abnormal  Collection Time   01/01/11  3:04 AM      Component Value Range Comment   Lactic Acid, Venous 3.0 (*) 0.5 - 2.2 (mmol/L)   CULTURE, BLOOD (ROUTINE X 2)     Status: Normal (Preliminary result)   Collection Time   01/01/11  3:09 AM      Component Value Range Comment   Specimen Description LEFT ANTECUBITAL      Special Requests BOTTLES DRAWN AEROBIC AND ANAEROBIC 4CC      Culture PENDING      Report Status PENDING     POCT I-STAT TROPONIN I     Status: Normal   Collection Time   01/01/11  3:23 AM      Component Value Range Comment   Troponin i, poc 0.04  0.00 - 0.08 (ng/mL)    Comment 3            GLUCOSE, CAPILLARY     Status: Abnormal   Collection Time   01/01/11  3:28 AM      Component Value Range Comment   Glucose-Capillary 191 (*) 70 - 99 (mg/dL)   URINALYSIS, ROUTINE W REFLEX MICROSCOPIC     Status: Abnormal   Collection Time   01/01/11  4:40 AM      Component Value Range Comment   Color, Urine YELLOW  YELLOW     APPearance CLEAR  CLEAR     Specific Gravity, Urine 1.030  1.005 - 1.030     pH 5.5  5.0 - 8.0     Glucose, UA NEGATIVE  NEGATIVE (mg/dL)    Hgb urine dipstick TRACE (*) NEGATIVE     Bilirubin Urine NEGATIVE  NEGATIVE     Ketones, ur TRACE (*) NEGATIVE (mg/dL)    Protein, ur >161 (*) NEGATIVE (mg/dL)    Urobilinogen, UA 0.2  0.0 - 1.0 (mg/dL)    Nitrite NEGATIVE  NEGATIVE     Leukocytes, UA NEGATIVE  NEGATIVE    URINE MICROSCOPIC-ADD ON     Status: Abnormal   Collection Time   01/01/11  4:40 AM      Component Value Range Comment   RBC / HPF 3-6  <3 (RBC/hpf)    Bacteria, UA  MANY (*) RARE     Dg Chest Port 1 View  01/01/2011  *RADIOLOGY REPORT*  Clinical Data: Weakness, hypotension  PORTABLE CHEST - 1 VIEW  Comparison: 01/17/2009  Findings: Heart size upper normal limits to mildly enlarged.  Small right and likely trace left pleural effusions with associated opacities; atelectasis versus infiltrate.  No pneumothorax.  No acute osseous abnormality.  IMPRESSION: Small right and likely trace left pleural effusions with associated opacities; atelectasis versus infiltrate.  Original Report Authenticated By: Waneta Martins, M.D.   EKG shows a sinus rhythm at 96 beats per minute. Normal axis. Intervals appear to be the normal range. No concerning ST changes. Nonspecific T wave, changes. No older EKG available for comparison.  Assessment/Plan  Principal Problem:  *Hypotension, unspecified Active Problems:  Allergic reaction  CAP (community acquired pneumonia)  ARF (acute renal failure)  DM type 2 (diabetes mellitus, type 2)   #1 hypotension: The etiology for this is not entirely clear to me at this time. The initial presentation sounded like an allergic/anaphylactic reaction. The inciting agent, if that was the case, is not known. Patient was given an epinephrine injection in the ED, and has been given IV fluids, and he seems to have responded to all these measures. However, his white  cell count was elevated. His lactic acid was mildly elevated. There was suspected pneumonia on the chest x-ray. Septic shock is a possibility however, less likely. Continue with IV fluids for now and monitor him in the step down unit. Hold his antihypertensive agents.  #2 allergic reaction: Again, inciting agent is not known. Am not aware of electric cigarettes causing allergic reactions. He is on an ACE inhibitor as well as on amlodipine, both of which can cause angioedema. However, his symptomatology and presentation was not consistent just with angioedema. It looked like he had a more  systemic reaction. However, because his blood pressures were low we will hold all of those agents. I put him on steroids for now. Can continue with antihistamine.  #3 pneumonia, possibly community acquired versus aspiration: We will keep the patient on vancomycin per pharmacy, and Levaquin.  #4 diabetes, put him on a sliding scale for now.  #5 acute renal failure. His baseline creatinine is around 0.4-0.5. Renal failure may have been presented due to ATN from hypotension. With IV hydration his renal function should improve.   Have discussed with the patient regarding another referral to an allergist, which can be arranged by his primary care physician as oupatient. I think this will be beneficial to this patient.  DVT, prophylaxis will be provided.  Further management decisions will depend on results of further testing and patient's response to treatment.    [facility]  [facility] Pager [phone]  01/01/2011, 6:24 AM

## 2011-01-01 NOTE — Progress Notes (Signed)
Physical Therapy Evaluation Patient Details Name: Dennis Mitchell MRN: [id number] DOB: 02-Jan-1951 Today's Date: 01/01/2011  Problem List:  Patient Active Problem List  Diagnoses  . Hypotension, unspecified  . Allergic reaction  . CAP (community acquired pneumonia)  . ARF (acute renal failure)  . DM type 2 (diabetes mellitus, type 2)    Past Medical History:  Past Medical History  Diagnosis Date  . Hypertension   . Diabetes mellitus   . Anxiety   . Hypercholesteremia    Past Surgical History:  Past Surgical History  Procedure Date  . Tracheostomy 2010    PT Assessment/Plan/Recommendation PT Assessment Clinical Impression Statement: pt appears to be at prior functional level...there are no problems that would deter his return to home at d/c...no PT needs PT Recommendation/Assessment: Patent does not need any further PT services No Skilled PT: Patient at baseline level of functioning PT Goals     PT Evaluation Precautions/Restrictions  Precautions Required Braces or Orthoses: No Restrictions Weight Bearing Restrictions: No Prior Functioning  Home Living Lives With: Alone Type of Home: House Home Layout: One level Home Access: Stairs to enter Entrance Stairs-Rails: None Entrance Stairs-Number of Steps: 1 Home Adaptive Equipment: Straight cane Prior Function Level of Independence: Independent with basic ADLs;Independent with homemaking with ambulation;Independent with gait;Independent with transfers Driving: Yes Cognition Cognition Arousal/Alertness: Awake/alert Overall Cognitive Status: Appears within functional limits for tasks assessed Sensation/Coordination Sensation Light Touch: Appears Intact Stereognosis: Not tested Hot/Cold: Not tested Coordination Gross Motor Movements are Fluid and Coordinated: Yes Fine Motor Movements are Fluid and Coordinated: Yes Extremity Assessment RUE Assessment RUE Assessment: Within Functional Limits LUE  Assessment LUE Assessment: Within Functional Limits LLE Assessment LLE Assessment: Within Functional Limits Mobility (including Balance) Bed Mobility Bed Mobility: Yes Supine to Sit: 7: Independent Sit to Supine - Right: 7: Independent Transfers Transfers: Yes Sit to Stand: 6: Modified independent (Device/Increase time) Stand to Sit: 6: Modified independent (Device/Increase time) Ambulation/Gait Ambulation/Gait: Yes Ambulation/Gait Assistance: 6: Modified independent (Device/Increase time) Ambulation Distance (Feet): 125 Feet Assistive device: Straight cane Gait Pattern: Within Functional Limits Stairs: No Wheelchair Mobility Wheelchair Mobility: No  Posture/Postural Control Posture/Postural Control: No significant limitations Balance Balance Assessed: No (WFL) Exercise    End of Session PT - End of Session Equipment Utilized During Treatment: Gait belt Activity Tolerance: Patient tolerated treatment well Patient left: in chair;with call bell in reach;with family/visitor present Nurse Communication: Mobility status for transfers;Mobility status for ambulation General Behavior During Session: [facility] for tasks performed Cognition: [facility] for tasks performed  Konrad Penta 01/01/2011, 3:02 PM

## 2011-01-01 NOTE — ED Notes (Signed)
Warming blanket applied as ordered.

## 2011-01-01 NOTE — ED Notes (Signed)
1st NS bolus complete. 2nd liter started.

## 2011-01-01 NOTE — ED Notes (Signed)
Manual bp assessed. Reported to Dr Bebe Shaggy. Orders to infuse 2 more NS boluses received and intitiated.

## 2011-01-01 NOTE — ED Notes (Signed)
ems called to home for ?allergic reaction, while checking vs, bp was low 88/55

## 2011-01-01 NOTE — ED Notes (Signed)
Dr Bebe Shaggy speaking with patient and family.

## 2011-01-01 NOTE — ED Notes (Signed)
NS bolus started #3 bag started in site #1 right AC. #4 started in site #2 right forearm.

## 2011-01-01 NOTE — ED Notes (Signed)
Receiving NS bolus. left to infuse

## 2011-01-01 NOTE — ED Notes (Signed)
Patient arrived via [facility]. Per ems patient was at home, states that he felt as if he was having an allergic reaction d/t itching all over. Patient had taken benadryl and vistaril at home. Patient arrived and was drowsy, but able to answer questions. Patient cool and diaphoretic. Rectal temp was assessed at 93.  Hypotensive upon arrival. Lips noted to be swollen. Breath sounds clear throughout.

## 2011-01-01 NOTE — ED Notes (Signed)
Patient completed #3 and #4 NS bolus.

## 2011-01-01 NOTE — ED Notes (Signed)
Report given to Jackie Plum

## 2011-01-02 ENCOUNTER — Other Ambulatory Visit (HOSPITAL_COMMUNITY): Payer: Medicare Other

## 2011-01-02 ENCOUNTER — Inpatient Hospital Stay (HOSPITAL_COMMUNITY): Payer: Medicare Other

## 2011-01-02 LAB — URINE CULTURE
Colony Count: NO GROWTH
Culture: NO GROWTH

## 2011-01-02 LAB — COMPREHENSIVE METABOLIC PANEL
AST: 34 U/L (ref 0–37)
Alkaline Phosphatase: 50 U/L (ref 39–117)
BUN: 14 mg/dL (ref 6–23)
CO2: 24 mEq/L (ref 19–32)
Chloride: 104 mEq/L (ref 96–112)
Creatinine, Ser: 0.67 mg/dL (ref 0.50–1.35)
GFR calc non Af Amer: >90 mL/min (ref >90)
Total Bilirubin: 0.4 mg/dL (ref 0.3–1.2)

## 2011-01-02 LAB — CBC
Hemoglobin: 15.4 g/dL (ref 13.0–17.0)
MCHC: 34.7 g/dL (ref 30.0–36.0)
Platelets: 154 10*3/uL (ref 150–400)
RDW: 13.7 % (ref 11.5–15.5)

## 2011-01-02 LAB — GLUCOSE, CAPILLARY: Glucose-Capillary: 212 mg/dL — ABNORMAL HIGH (ref 70–99)

## 2011-01-02 LAB — DIFFERENTIAL
Basophils Absolute: 0 10*3/uL (ref 0.0–0.1)
Basophils Relative: 0 % (ref 0–1)
Eosinophils Absolute: 0 10*3/uL (ref 0.0–0.7)
Monocytes Absolute: 0.2 10*3/uL (ref 0.1–1.0)
Neutro Abs: 15.6 10*3/uL — ABNORMAL HIGH (ref 1.7–7.7)

## 2011-01-02 LAB — LEGIONELLA ANTIGEN, URINE

## 2011-01-02 LAB — STREP PNEUMONIAE URINARY ANTIGEN: Strep Pneumo Urinary Antigen: NEGATIVE

## 2011-01-02 MED ORDER — PREDNISONE 20 MG PO TABS
50.0000 mg | ORAL_TABLET | Freq: Every day | ORAL | Status: DC
  Administered 2011-01-03: 50 mg via ORAL
  Filled 2011-01-02: qty 5
  Filled 2011-01-02: qty 2

## 2011-01-02 MED ORDER — HYDROCHLOROTHIAZIDE 25 MG PO TABS
25.0000 mg | ORAL_TABLET | Freq: Every day | ORAL | Status: DC
  Administered 2011-01-02 – 2011-01-03 (×2): 25 mg via ORAL
  Filled 2011-01-02 (×2): qty 1

## 2011-01-02 NOTE — Progress Notes (Signed)
Utilization review completed.  

## 2011-01-02 NOTE — Progress Notes (Signed)
Subjective He feel better, denies any chest pain or shortness of breath he actually would like to go all complain of and sinuses drainage denies any shortness of breath, had good bowel movement and denies any nausea or vomiting  Objective: Vital signs in last 24 hours: Filed Vitals:   01/02/11 0300 01/02/11 0400 01/02/11 0500 01/02/11 0600  BP: 149/73 131/71    Pulse: 74 73 90 73  Temp:  98.6 F (37 C)    TempSrc:  Oral    Resp: 17 15 19 13   Height:      Weight:      SpO2: 95% 95% 95% 93%   Weight change:   Intake/Output Summary (Last 24 hours) at 01/02/11 0852 Last data filed at 01/02/11 0600  Gross per 24 hour  Intake   2515 ml  Output   3450 ml  Net   -935 ml    Sitting on bed comfortably not on respiratory distress or shortness of breath. Neck  with no lymphadenopathy Heart S1 and S2 with no added sounds Lungs examination normal vesicular breathing with equal air entry Abdomen distended and obese. Bowel sounds present nontender Extremities without edema and peripheral pulses intact   Lab Results:  [facility] 01/02/11 0427 01/01/11 2014  NA 136 134*  K 4.1 4.4  CL 104 104  CO2 24 23  GLUCOSE 200* 249*  BUN 14 19  CREATININE 0.67 0.90  CALCIUM 8.8 8.6  MG -- --  PHOS -- --    Basename 01/02/11 0427  AST 34  ALT 29  ALKPHOS 50  BILITOT 0.4  PROT 6.2  ALBUMIN 3.0*   No results found for this basename: LIPASE:2,AMYLASE:2 in the last 72 hours  Basename 01/02/11 0427 01/01/11 0304  WBC 16.6* 25.7*  NEUTROABS 15.6* 21.4*  HGB 15.4 18.6*  HCT 44.4 52.6*  MCV 90.8 91.0  PLT 154 199     Basename 01/01/11 0826  HGBA1C 4.2   No results found for this basename: CHOL:2,HDL:2,LDLCALC:2,TRIG:2,CHOLHDL:2,LDLDIRECT:2 in the last 72 hours  Basename 01/01/11 0826  TSH 6.005*  T4TOTAL --  T3FREE --  THYROIDAB --   No results found for this basename: VITAMINB12:2,FOLATE:2,FERRITIN:2,TIBC:2,IRON:2,RETICCTPCT:2 in the last 72 hours  Micro Results: Recent  Results (from the past 240 hour(s))  CULTURE, BLOOD (ROUTINE X 2)     Status: Normal (Preliminary result)   Collection Time   01/01/11  3:09 AM      Component Value Range Status Comment   Specimen Description LEFT ANTECUBITAL   Final    Special Requests BOTTLES DRAWN AEROBIC AND ANAEROBIC 4CC   Final    Culture NO GROWTH <24 HRS   Final    Report Status PENDING   Incomplete   CULTURE, BLOOD (ROUTINE X 2)     Status: Normal (Preliminary result)   Collection Time   01/01/11  3:55 AM      Component Value Range Status Comment   Specimen Description Blood LEFT ARM   Final    Special Requests NONE 6CC   Final    Culture NO GROWTH <24 HRS   Final    Report Status PENDING   Incomplete   MRSA PCR SCREENING     Status: Normal   Collection Time   01/01/11 12:11 PM      Component Value Range Status Comment   MRSA by PCR NEGATIVE  NEGATIVE  Final     Studies/Results: Dg Chest Port 1 View  01/01/2011  *RADIOLOGY REPORT*  Clinical Data: Weakness, hypotension  PORTABLE CHEST - 1 VIEW  Comparison: 01/17/2009  Findings: Heart size upper normal limits to mildly enlarged.  Small right and likely trace left pleural effusions with associated opacities; atelectasis versus infiltrate.  No pneumothorax.  No acute osseous abnormality.  IMPRESSION: Small right and likely trace left pleural effusions with associated opacities; atelectasis versus infiltrate.  Original Report Authenticated By: Waneta Martins, M.D.    Medications: I have reviewed the patient's current medications. Scheduled Meds:   . citalopram  40 mg Oral Daily  . famotidine  20 mg Oral Daily  . heparin  5,000 Units Subcutaneous Q8H  . insulin aspart      . insulin aspart  0-20 Units Subcutaneous TID WC  . insulin aspart  0-5 Units Subcutaneous QHS  . levofloxacin (LEVAQUIN) IV  250 mg Intravenous Once  . levofloxacin (LEVAQUIN) IV  750 mg Intravenous Q24H  . predniSONE  50 mg Oral Q breakfast  . sodium chloride      . vancomycin   1,250 mg Intravenous Q12H  . vancomycin  1,000 mg Intravenous Q1 Hr x 2  . DISCONTD: albuterol  2.5 mg Nebulization Q6H  . DISCONTD: levofloxacin (LEVAQUIN) IV  750 mg Intravenous Q24H  . DISCONTD: methylPREDNISolone (SOLU-MEDROL) injection  60 mg Intravenous Q12H   Continuous Infusions:   . DISCONTD: sodium chloride 75 mL/hr at 01/02/11 0600   PRN Meds:.albuterol, ALPRAZolam, hydrOXYzine  Assessment/Plan: 1-hypotension resolved  2-allergic reaction cause not clear respond to steroid would wean off and changed to by mouth  3-, pulse width of community-acquired pneumonia treated with vancomycin and Levaquin bending final blood culture  4-leukocytosis trending down part of it could be related to steroid  5-steroid-induced hyperglycemia we'll check hemoglobin C1 and add is an insulin sliding scale  6-hypertension resume hydrochlorothiazide  7-counseling regarding smoking transfer to telemetry bed for likely dc in am. Aiden Rao I. 01/02/2011, 8:52 AM

## 2011-01-02 NOTE — Progress Notes (Signed)
Inpatient Diabetes Program Recommendations  AACE/ADA: New Consensus Statement on Inpatient Glycemic Control (2009)  Target Ranges:  Prepandial:   less than 140 mg/dL      Peak postprandial:   less than 180 mg/dL (1-2 hours)      Critically ill patients:  140 - 180 mg/dL   Reason for Visit: Elevated glucose: 200 mg/dL  History of Diabetes and on IV steroids  Inpatient Diabetes Program Recommendations Insulin - Basal: Add Lantus 20 units daily:  120 kg * 0.2= 24 units of basal

## 2011-01-03 LAB — CBC
MCHC: 34.7 g/dL (ref 30.0–36.0)
Platelets: 148 10*3/uL — ABNORMAL LOW (ref 150–400)
RDW: 14 % (ref 11.5–15.5)
WBC: 16.5 10*3/uL — ABNORMAL HIGH (ref 4.0–10.5)

## 2011-01-03 LAB — GLUCOSE, CAPILLARY

## 2011-01-03 MED ORDER — FLUTICASONE PROPIONATE 50 MCG/ACT NA SUSP: 1.0000 | Freq: Every day | NASAL | Status: DC

## 2011-01-03 MED ORDER — PREDNISONE 1 MG PO TABS: 10.0000 mg | ORAL_TABLET | Freq: Every day | ORAL | Status: AC

## 2011-01-03 MED ORDER — LEVOFLOXACIN 500 MG PO TABS: 500.0000 mg | ORAL_TABLET | Freq: Every day | ORAL | Status: AC

## 2011-01-03 MED ORDER — OXYMETAZOLINE HCL 0.05 % NA SOLN
1.0000 | Freq: Two times a day (BID) | NASAL | Status: DC
  Filled 2011-01-03: qty 15

## 2011-01-03 MED ORDER — FLUTICASONE PROPIONATE 50 MCG/ACT NA SUSP
1.0000 | Freq: Every day | NASAL | Status: DC
  Filled 2011-01-03: qty 16

## 2011-01-03 MED ORDER — OXYMETAZOLINE HCL 0.05 % NA SOLN: 1.0000 | Freq: Two times a day (BID) | NASAL | Status: AC

## 2011-01-03 MED ORDER — ALBUTEROL SULFATE (2.5 MG/3ML) 0.083% IN NEBU: 2.5000 mg | INHALATION_SOLUTION | Freq: Four times a day (QID) | RESPIRATORY_TRACT | Status: DC | PRN

## 2011-01-03 NOTE — Discharge Summary (Signed)
DISCHARGE SUMMARY  Dennis Mitchell  MR#: [id number]  DOB:December 05, 1950  Date of Admission: 01/01/2011 Date of Discharge: 01/03/2011  Attending Physician:Delois Tolbert I.  Patient's GMW:NUUVOZ,DGUYQ, MD, MD  Consults:none  Discharge Diagnoses: 1-allergic reaction 2-community-acquired pneumonia 3-leukocytosis improved- 4-hypotension resolved 5-diabetes mellitus on oral hypoglycemic  6-COPD  7-ongoing tobacco abuse  8-abnormal TSH negative father investigated by M.D.  9-sinusitis  10-hypertension  Current Discharge Medication List    START taking these medications   Details  albuterol (PROVENTIL) (2.5 MG/3ML) 0.083% nebulizer solution Take 3 mLs (2.5 mg total) by nebulization every 6 (six) hours as needed for wheezing. Qty: 75 mL, Refills: 12    fluticasone (FLONASE) 50 MCG/ACT nasal spray Place 1 spray into the nose daily. Qty: 30 g, Refills: 0    levofloxacin (LEVAQUIN) 500 MG tablet Take 1 tablet (500 mg total) by mouth daily. Qty: 5 tablet, Refills: 0    oxymetazoline (AFRIN) 0.05 % nasal spray Place 1 spray into the nose 2 (two) times daily. Qty: 30 mL, Refills: 0    predniSONE (DELTASONE) 1 MG tablet Take 10 tablets (10 mg total) by mouth daily. Qty: 20 tablet, Refills: 0      CONTINUE these medications which have NOT CHANGED   Details  !! ALPRAZolam (XANAX) 1 MG tablet Take 1 mg by mouth at bedtime as needed. Sleep aid     !! ALPRAZolam (XANAX) 1 MG tablet Take 0.5 mg by mouth daily.      amLODipine (NORVASC) 5 MG tablet Take 5 mg by mouth daily.     citalopram (CELEXA) 40 MG tablet Take 40 mg by mouth daily.      diphenhydrAMINE (BENADRYL) 25 mg capsule Take 25 mg by mouth every 6 (six) hours as needed.      hydrochlorothiazide (HYDRODIURIL) 25 MG tablet Take 25 mg by mouth daily.      metFORMIN (GLUCOPHAGE) 1000 MG tablet Take 1,000 mg by mouth 2 (two) times daily with a meal.     phentermine 37.5 MG capsule Take 37.5 mg by mouth every morning.        ranitidine (ZANTAC) 300 MG tablet Take 300 mg by mouth daily.       !! - Potential duplicate medications found. Please discuss with provider.    STOP taking these medications     hydrOXYzine (VISTARIL) 25 MG capsule      lisinopril (PRINIVIL,ZESTRIL) 20 MG tablet           Hospital Course: 60 year old pulsation male with a past medical history of diabetes, obesity and hypertension who was in his usual state of health earlier the date of admission he started feeling weak with teaching and noticed red spot on his the skin, his hands he noticed his legs were swelling, he feels like about to pass out, he denies any chest pain, no syncope, any meds was called and found to have low blood pressure, he complained of cough and congestion type symptoms for the last few days,On presentation his pulse rate was 113 temperature 98.8 blood pressure systolic in the 90s, he noticed to have white blood cells of 25.7 with increased bandemia as more than 20%, accordingly patient admitted to the hospital for further evaluation, patient treated as a case of allergic reaction, noticed a patient on lisinopril which was DC'd, his symptoms significantly improved and his blood pressure improved, and and and 1+ status felt it could be secondary to infection his chest x-ray suggests infiltrate and accordingly patient treated with Levaquin and vancomycin, blood  culture were negative, urine culture was negative .his white blood cells is trending down but as the patient steroid this could be related to steroid 2, patient has no father fever, cardiac enzymes were cycled which were negative. At this time until the patient is stable for discharge need to follow up with his M.D. We will hold lisinopril and further evaluated by his M.D.    Day of Discharge BP 149/89  Pulse 57  Temp(Src) 97.7 F (36.5 C) (Oral)  Resp 16  Ht 5\' 6"  (1.676 m)  Wt 120.203 kg (265 lb)  BMI 42.77 kg/m2  SpO2 95%  Physical Exam: Patient sitting  on bed not on respiratory distress or shortness of breath, he complained of some nasal congestion and  his Nasonex prescribed .next subtle is no lymph nodes or thyromegaly   heart S1 and S2 with no added sounds Lungs examination normal vesicular breathing with equal air entry Abdomen soft nontender bowel sounds present Extremities without edema Rash is completely resolved  Results for orders placed during the hospital encounter of 01/01/11 (from the past 24 hour(s))  GLUCOSE, CAPILLARY     Status: Abnormal   Collection Time   01/02/11 11:54 AM      Component Value Range   Glucose-Capillary 212 (*) 70 - 99 (mg/dL)   Comment 1 Notify RN     Comment 2 Documented in Chart    GLUCOSE, CAPILLARY     Status: Abnormal   Collection Time   01/02/11  4:18 PM      Component Value Range   Glucose-Capillary 242 (*) 70 - 99 (mg/dL)   Comment 1 Notify RN     Comment 2 Documented in Chart    GLUCOSE, CAPILLARY     Status: Abnormal   Collection Time   01/02/11  9:26 PM      Component Value Range   Glucose-Capillary 260 (*) 70 - 99 (mg/dL)   Comment 1 Documented in Chart     Comment 2 Notify RN    CBC     Status: Abnormal   Collection Time   01/03/11  4:21 AM      Component Value Range   WBC 16.5 (*) 4.0 - 10.5 (K/uL)   RBC 4.30  4.22 - 5.81 (MIL/uL)   Hemoglobin 13.7  13.0 - 17.0 (g/dL)   HCT 91.4  78.2 - 95.6 (%)   MCV 91.9  78.0 - 100.0 (fL)   MCH 31.9  26.0 - 34.0 (pg)   MCHC 34.7  30.0 - 36.0 (g/dL)   RDW 21.3  08.6 - 57.8 (%)   Platelets 148 (*) 150 - 400 (K/uL)  GLUCOSE, CAPILLARY     Status: Abnormal   Collection Time   01/03/11  7:42 AM      Component Value Range   Glucose-Capillary 110 (*) 70 - 99 (mg/dL)   Comment 1 Notify RN     Comment 2 Documented in Chart    VANCOMYCIN, TROUGH     Status: Abnormal   Collection Time   01/03/11  8:49 AM      Component Value Range   Vancomycin Tr 8.3 (*) 10.0 - 20.0 (ug/mL)    Disposition: home   Follow-up Appts:   Follow-up  with MD 5DAYS     Signed: Laina Guerrieri I. 01/03/2011, 9:53 AM

## 2011-01-03 NOTE — Progress Notes (Signed)
Pt discharged home via family; Pt and family given and explained all discharge instructions, carenotes, and prescriptions; pt and family stated understanding and denied questions/concerns; all f/u appointments in place; IV removed without complicaitons; pt stable at time of discharge  

## 2011-01-06 LAB — CULTURE, BLOOD (ROUTINE X 2)

## 2011-03-08 ENCOUNTER — Emergency Department (HOSPITAL_COMMUNITY)
Admission: EM | Admit: 2011-03-08 | Discharge: 2011-03-08 | Disposition: A | Discharge status: Home / Self Care | Payer: Medicare Other | Attending: Emergency Medicine | Admitting: Emergency Medicine

## 2011-03-08 ENCOUNTER — Encounter (HOSPITAL_COMMUNITY): Payer: Self-pay

## 2011-03-08 DIAGNOSIS — I10 Essential (primary) hypertension: Secondary | ICD-10-CM | POA: Insufficient documentation

## 2011-03-08 DIAGNOSIS — F17200 Nicotine dependence, unspecified, uncomplicated: Secondary | ICD-10-CM | POA: Insufficient documentation

## 2011-03-08 DIAGNOSIS — E7200 Disorders of amino-acid transport, unspecified: Secondary | ICD-10-CM | POA: Insufficient documentation

## 2011-03-08 DIAGNOSIS — R42 Dizziness and giddiness: Secondary | ICD-10-CM | POA: Insufficient documentation

## 2011-03-08 DIAGNOSIS — R0602 Shortness of breath: Secondary | ICD-10-CM | POA: Insufficient documentation

## 2011-03-08 DIAGNOSIS — L298 Other pruritus: Secondary | ICD-10-CM | POA: Insufficient documentation

## 2011-03-08 DIAGNOSIS — T394X5A Adverse effect of antirheumatics, not elsewhere classified, initial encounter: Secondary | ICD-10-CM | POA: Insufficient documentation

## 2011-03-08 DIAGNOSIS — T7840XA Allergy, unspecified, initial encounter: Secondary | ICD-10-CM

## 2011-03-08 DIAGNOSIS — L2989 Other pruritus: Secondary | ICD-10-CM | POA: Insufficient documentation

## 2011-03-08 DIAGNOSIS — L500 Allergic urticaria: Secondary | ICD-10-CM | POA: Insufficient documentation

## 2011-03-08 DIAGNOSIS — E119 Type 2 diabetes mellitus without complications: Secondary | ICD-10-CM | POA: Insufficient documentation

## 2011-03-08 HISTORY — DX: Dorsalgia, unspecified: M54.9

## 2011-03-08 MED ORDER — EPINEPHRINE 0.3 MG/0.3ML IJ DEVI: 0.3000 mg | Freq: Once | INTRAMUSCULAR | Status: DC

## 2011-03-08 MED ORDER — IPRATROPIUM BROMIDE 0.02 % IN SOLN
0.5000 mg | Freq: Once | RESPIRATORY_TRACT | Status: AC
  Administered 2011-03-08: 0.5 mg via RESPIRATORY_TRACT
  Filled 2011-03-08: qty 2.5

## 2011-03-08 MED ORDER — PREDNISONE 20 MG PO TABS
60.0000 mg | ORAL_TABLET | Freq: Once | ORAL | Status: AC
  Administered 2011-03-08: 60 mg via ORAL
  Filled 2011-03-08: qty 3

## 2011-03-08 MED ORDER — FAMOTIDINE 20 MG PO TABS
40.0000 mg | ORAL_TABLET | Freq: Once | ORAL | Status: AC
  Administered 2011-03-08: 40 mg via ORAL
  Filled 2011-03-08: qty 2

## 2011-03-08 MED ORDER — PREDNISONE 20 MG PO TABS: 20.0000 mg | ORAL_TABLET | Freq: Every day | ORAL | Status: AC

## 2011-03-08 MED ORDER — ALBUTEROL SULFATE (5 MG/ML) 0.5% IN NEBU
2.5000 mg | INHALATION_SOLUTION | Freq: Once | RESPIRATORY_TRACT | Status: AC
  Administered 2011-03-08: 2.5 mg via RESPIRATORY_TRACT
  Filled 2011-03-08: qty 1

## 2011-03-08 MED ORDER — DIPHENHYDRAMINE HCL 25 MG PO CAPS
50.0000 mg | ORAL_CAPSULE | Freq: Once | ORAL | Status: AC
  Administered 2011-03-08: 50 mg via ORAL
  Filled 2011-03-08: qty 2

## 2011-03-08 NOTE — Discharge Instructions (Signed)
RESOURCE GUIDE  Dental Problems  Patients with Medicaid: [facility]                     [facility] [address]                                           [address] Phone:  [phone]                                                  Phone:  [phone]  If unable to pay or uninsured, contact:  [facility] or [facility] to become qualified for the adult dental clinic.  Chronic Pain Problems Contact [facility]  [phone] Patients need to be referred by their primary care doctor.  Insufficient Money for Medicine Contact [employer]:  call "211" or [facility] 271-5999.  No Primary Care Doctor Call [facility]  832-8000 Other agencies that provide inexpensive medical care    [facility]  [phone]    [facility]  [phone]    [facility]  [phone]    [facility] Clinic  [phone]    [facility]  [phone]    [facility]  [phone]  Psychological Services [facility]  [phone] [facility]  [phone] [facility]   [phone] (emergency services 641-4993)  Substance Abuse Resources [facility]  336-882-2125 [facility] 336-784-9470 [facility] 336-285-9073 [facility] 336-845-3988 Residential & Outpatient Substance Abuse Program  800-659-3381  Abuse/Neglect [place] Child Abuse Hotline (336) 641-3795 [place] Child Abuse Hotline 800-378-5315 (After Hours)  Emergency Shelter [facility] (336) 271-5985  M[facility] Homes [facility] (336) 275-9566 [facility] (704) 372-4663  MRSA Hotline #:   832-7006    [place] [facility]  [facility] [place]     [employer]                          [facility] [address] [place]                       [address]      [address]  [place]                                                [place]                            [place] Phone:  [phone]                                   Phone:  [phone]                 Phone:  [phone]  [facility] Phone:  [phone]    [facility] Child Abuse Hotline (940) 575-0488 (229)692-0904 (After Hours)    Take the prescription as directed.  If you are finding your prescription of hydroxyzine (atarax/vistaril) is not working for your itching or rash, start to take over the counter benadryl, as directed on packaging, as needed for itching or rash instead.  You will see that your blood sugars will likely be higher than normal while you are on the prednisone, keep a diary of your readings so you can tell your doctor the readings.  Call your regular medical doctor tomorrow morning to schedule a follow up appointment this week and to receive further advice regarding your elevated blood sugars.  Return to the Emergency Department immediately sooner if worsening.

## 2011-03-08 NOTE — ED Notes (Signed)
Pt reports broke out in hives today, was dizzy,  And some SOB.  Pt has taken hydroxyzine, benadryl, and epi pen.  Says doesn't know what he is allergic to.  Reports had similar episode in the past and was given the epi pen to take.  Today was the first time he has used the epi pen.  Reports took 2 aleve this morning for back pain.  Says broke out in hives approx 2 hours later.  Pt says had taken aleve the last time this happened also.

## 2011-03-08 NOTE — ED Provider Notes (Signed)
History     CSN: [id number]  Arrival date & time 03/08/11  1702   First MD Initiated Contact with Patient 03/08/11 1729      Chief Complaint  Patient presents with  . Allergic Reaction    HPI Pt was seen at 1800.  Per pt, c/o gradual onset and improvement in constant allergic reaction that began this afternoon PTA.  Pt states he took aleve approx noon, then at approx 1400 (2 hours later) he began to "itch all over and get welts."  States this was assoc with mild SOB and lightheadedness.  Endorses this has happened to him intermittently when when he has previously taken aleve.  States he took benadryl, atarax, and his epi pen approx 1600/1630 PTA.  States he feels "like I'm getting better now."  Denies CP/palpitations, no cough, no drooling/stridor, no abd pain, no N/V/D, no fevers.    Past Medical History  Diagnosis Date  . Hypertension   . Diabetes mellitus   . Anxiety   . Hypercholesteremia   . Back pain     Past Surgical History  Procedure Date  . Tracheostomy 2010   History  Substance Use Topics  . Smoking status: Current Some Day Smoker -- 2.0 packs/day for 40 years    Types: Cigars, Cigarettes  . Smokeless tobacco: Not on file   Comment: Using electric cigarettes  . Alcohol Use: 2.4 oz/week    4 Cans of beer per week    Review of Systems ROS: Statement: All systems negative except as marked or noted in the HPI; Constitutional: Negative for fever and chills. ; ; Eyes: Negative for eye pain, redness and discharge. ; ; ENMT: Negative for ear pain, hoarseness, nasal congestion, sinus pressure and sore throat. ; ; Cardiovascular: Negative for chest pain, palpitations, diaphoresis, and peripheral edema. ; ; Respiratory: +SOB. Negative for cough, wheezing and stridor. ; ; Gastrointestinal: Negative for nausea, vomiting, diarrhea, abdominal pain, blood in stool, hematemesis, jaundice and rectal bleeding. . ; ; Genitourinary: Negative for dysuria, flank pain and hematuria. ; ;  Musculoskeletal: Negative for back pain and neck pain. Negative for swelling and trauma.; ; Skin: +hives. Negative for abrasions, blisters, bruising and skin lesion.; ; Neuro: +lightheadedness. Negative for headache and neck stiffness. Negative for weakness, altered level of consciousness , altered mental status, extremity weakness, paresthesias, involuntary movement, seizure and syncope.    Allergies  Aspirin and Penicillins  Home Medications   Current Outpatient Rx  Name Route Sig Dispense Refill  . ALPRAZOLAM 1 MG PO TABS Oral Take 1 mg by mouth at bedtime as needed. Sleep aid     . AMLODIPINE BESYLATE 5 MG PO TABS Oral Take 5 mg by mouth daily.     Marland Kitchen CITALOPRAM HYDROBROMIDE 40 MG PO TABS Oral Take 40 mg by mouth daily.      Marland Kitchen DIPHENHYDRAMINE HCL 25 MG PO CAPS Oral Take 25 mg by mouth every 6 (six) hours as needed.      Marland Kitchen HYDROCHLOROTHIAZIDE 25 MG PO TABS Oral Take 25 mg by mouth daily.      Marland Kitchen HYDROXYZINE HCL 25 MG PO TABS Oral Take 25 mg by mouth every 6 (six) hours as needed. itching    . METFORMIN HCL 1000 MG PO TABS Oral Take 1,000 mg by mouth 2 (two) times daily with a meal.     . OXYCODONE-ACETAMINOPHEN 5-325 MG PO TABS Oral Take 1 tablet by mouth every 4 (four) hours as needed. pain    .  PHENTERMINE HCL 37.5 MG PO CAPS Oral Take 37.5 mg by mouth every morning.      Marland Kitchen RANITIDINE HCL 300 MG PO TABS Oral Take 300 mg by mouth daily.        BP 145/94  Pulse 98  Temp(Src) 97.7 F (36.5 C) (Oral)  Resp 16  Ht 5\' 7"  (1.702 m)  Wt 280 lb (127.007 kg)  BMI 43.85 kg/m2  SpO2 95%  Physical Exam 1805: Physical examination:  Nursing notes reviewed; Vital signs and O2 SAT reviewed;  Constitutional: Well developed, Well nourished, Well hydrated, In no acute distress; Head:  Normocephalic, atraumatic; Eyes: EOMI, PERRL, No scleral icterus; ENMT: Mouth and pharynx normal, No intra-oral edema, no hoarse voice, no drooling, no stridor.  Mucous membranes moist; Neck: Supple, Full range of  motion, No lymphadenopathy; Cardiovascular: Tachycardic rate and rhythm, No murmur, rub, or gallop; Respiratory: Breath sounds clear & equal bilaterally, occas scattered exp wheeze, speaking full sentences with ease. Normal respiratory effort/excursion; Chest: Nontender, Movement normal; Abdomen: Soft, Nontender, Nondistended, Normal bowel sounds; Extremities: Pulses normal, No tenderness, No edema, No calf edema or asymmetry.; Neuro: AA&Ox3, Major CN grossly intact.  No gross focal motor or sensory deficits in extremities.; Skin: Color normal, Warm, Dry, +scattered hives that pt is scratching during exam.    ED Course  Procedures   MDM  MDM Reviewed: nursing note and vitals    9:34 PM:  Pt states he "feels great, now get me out of here!"  States he feels "much better" after meds.  Lungs CTA bilat, Sats 96% R/A, resps easy, HR 90's during my re-eval.  Hives improved, and pt is no longer itching.  No intra-oral edema, no stridor, speech clear.  Pt has been observed approx 5+ hours after he gave himself his epi pen without rebound reaction.  Wants to go home.  Dx d/w pt.  Questions answered.  Verb understanding, agreeable to d/c home with outpt f/u this week and agrees not to take any more aleve.           Laray Anger, DO 03/10/11 2128

## 2011-03-08 NOTE — ED Notes (Signed)
Correction, pt says is unsure if he had taken aleve the last time he had this reaction.

## 2011-03-08 NOTE — ED Notes (Signed)
Pt a/ox4. Resp even and unlabored. NAD at this time. D/C instructions reviewed with pt. Pt verbalized understanding. Pt ambulated to lobby with steady gate.  

## 2011-08-28 ENCOUNTER — Ambulatory Visit: Payer: Medicare Other | Attending: Neurology | Admitting: Physical Therapy

## 2011-08-28 ENCOUNTER — Encounter: Payer: Medicare Other | Admitting: Physical Therapy

## 2011-08-28 DIAGNOSIS — IMO0001 Reserved for inherently not codable concepts without codable children: Secondary | ICD-10-CM | POA: Insufficient documentation

## 2011-08-28 DIAGNOSIS — H8110 Benign paroxysmal vertigo, unspecified ear: Secondary | ICD-10-CM | POA: Insufficient documentation

## 2011-09-09 ENCOUNTER — Ambulatory Visit: Payer: Medicare Other | Admitting: Physical Therapy

## 2011-09-15 ENCOUNTER — Ambulatory Visit: Payer: Medicare Other | Attending: Neurology | Admitting: Physical Therapy

## 2011-09-15 DIAGNOSIS — H8110 Benign paroxysmal vertigo, unspecified ear: Secondary | ICD-10-CM | POA: Insufficient documentation

## 2011-09-15 DIAGNOSIS — IMO0001 Reserved for inherently not codable concepts without codable children: Secondary | ICD-10-CM | POA: Insufficient documentation

## 2012-04-05 ENCOUNTER — Encounter: Payer: Self-pay | Admitting: *Deleted

## 2012-04-06 ENCOUNTER — Ambulatory Visit (INDEPENDENT_AMBULATORY_CARE_PROVIDER_SITE_OTHER): Payer: Medicare Other | Admitting: Family Medicine

## 2012-04-06 ENCOUNTER — Encounter: Payer: Self-pay | Admitting: Family Medicine

## 2012-04-06 VITALS — BP 146/104 | HR 90 | Ht 66.0 in | Wt 267.7 lb

## 2012-04-06 DIAGNOSIS — Z125 Encounter for screening for malignant neoplasm of prostate: Secondary | ICD-10-CM

## 2012-04-06 DIAGNOSIS — G4700 Insomnia, unspecified: Secondary | ICD-10-CM | POA: Insufficient documentation

## 2012-04-06 DIAGNOSIS — G8929 Other chronic pain: Secondary | ICD-10-CM

## 2012-04-06 DIAGNOSIS — E119 Type 2 diabetes mellitus without complications: Secondary | ICD-10-CM

## 2012-04-06 DIAGNOSIS — M549 Dorsalgia, unspecified: Secondary | ICD-10-CM | POA: Insufficient documentation

## 2012-04-06 DIAGNOSIS — E785 Hyperlipidemia, unspecified: Secondary | ICD-10-CM | POA: Insufficient documentation

## 2012-04-06 LAB — POCT GLYCOSYLATED HEMOGLOBIN (HGB A1C): Hemoglobin A1C: 9

## 2012-04-06 MED ORDER — ALPRAZOLAM 1 MG PO TABS: 1.0000 mg | ORAL_TABLET | Freq: Every evening | ORAL | Status: DC | PRN

## 2012-04-06 MED ORDER — OXYCODONE-ACETAMINOPHEN 5-325 MG PO TABS: 1.0000 | ORAL_TABLET | ORAL | Status: DC | PRN

## 2012-04-06 MED ORDER — METFORMIN HCL 1000 MG PO TABS: 1000.0000 mg | ORAL_TABLET | Freq: Two times a day (BID) | ORAL | Status: DC

## 2012-04-06 NOTE — Progress Notes (Signed)
  Subjective:    Patient ID: Dennis Mitchell, male    DOB: 14-Mar-1950, 62 y.o.   MRN: [id number]  Diabetes He has type 2 diabetes mellitus. His disease course has been stable. Symptoms are stable. Current diabetic treatment includes oral agent (dual therapy). He is compliant with treatment all of the time. His weight is stable. He is following a diabetic diet. He has had a previous visit with a dietician. His breakfast blood glucose range is generally 110-130 mg/dl. Eye exam is not current.   Intermittent dizziness worse with certain motion and significant. Patient notes not exercising very much at this time. He claims compliance with all his medications. No chest pain no headache. Just saw a specialist for her the dizziness, he was frustrated that he did not get to see the Dr.  Review of Systems  All other systems reviewed and are negative.       Results for orders placed in visit on 04/06/12  POCT GLYCOSYLATED HEMOGLOBIN (HGB A1C)      Result Value Range   Hemoglobin A1C 9.0     Objective:   Physical Exam  Vitals reviewed. Constitutional: He is oriented to person, place, and time. He appears well-developed and well-nourished.  HENT:  Head: Normocephalic and atraumatic.  Eyes: Conjunctivae are normal. Pupils are equal, round, and reactive to light.  Neck: Normal range of motion. Neck supple.  Cardiovascular: Normal rate and regular rhythm.   Pulmonary/Chest: Effort normal and breath sounds normal.  Neurological: He is alert and oriented to person, place, and time.  Skin: Skin is warm.  Diabetic foot exam reveals intact sensation. Pulses adequate. Positive venous stasis changes noted.  Psychiatric: He has a normal mood and affect.    Blood pressure on repeat 134/86      Assessment & Plan:  Impression #1 type 2 diabetes. Suboptimum control. Discussed with patient. #2 hypertension decent control. #3 dizziness ongoing. #4 hyperlipidemia exact status uncertain. Plan as per orders.  Medication adjusted. WSL

## 2012-04-13 ENCOUNTER — Other Ambulatory Visit: Payer: Self-pay | Admitting: Family Medicine

## 2012-04-13 LAB — HEPATIC FUNCTION PANEL
Albumin: 3.9 g/dL (ref 3.5–5.2)
Alkaline Phosphatase: 74 U/L (ref 39–117)
Total Bilirubin: 0.5 mg/dL (ref 0.3–1.2)
Total Protein: 6.6 g/dL (ref 6.0–8.3)

## 2012-04-13 LAB — BASIC METABOLIC PANEL
CO2: 30 mEq/L (ref 19–32)
Calcium: 9 mg/dL (ref 8.4–10.5)
Creat: 0.95 mg/dL (ref 0.50–1.35)
Glucose, Bld: 219 mg/dL — ABNORMAL HIGH (ref 70–99)
Sodium: 135 mEq/L (ref 135–145)

## 2012-04-18 LAB — LIPID PANEL
Cholesterol: 205 mg/dL — ABNORMAL HIGH (ref 0–200)
HDL: 53 mg/dL (ref >39)
Total CHOL/HDL Ratio: 3.9 Ratio
Triglycerides: 176 mg/dL — ABNORMAL HIGH (ref <150)

## 2012-04-22 ENCOUNTER — Encounter: Payer: Self-pay | Admitting: Family Medicine

## 2012-04-24 ENCOUNTER — Other Ambulatory Visit: Payer: Self-pay | Admitting: Family Medicine

## 2012-05-20 ENCOUNTER — Emergency Department (HOSPITAL_COMMUNITY): Payer: Medicare Other

## 2012-05-20 ENCOUNTER — Emergency Department (HOSPITAL_COMMUNITY)
Admission: EM | Admit: 2012-05-20 | Discharge: 2012-05-20 | Disposition: A | Discharge status: Home / Self Care | Payer: Medicare Other | Attending: Emergency Medicine | Admitting: Emergency Medicine

## 2012-05-20 ENCOUNTER — Encounter (HOSPITAL_COMMUNITY): Payer: Self-pay | Admitting: *Deleted

## 2012-05-20 ENCOUNTER — Other Ambulatory Visit: Payer: Self-pay

## 2012-05-20 DIAGNOSIS — Z87891 Personal history of nicotine dependence: Secondary | ICD-10-CM | POA: Insufficient documentation

## 2012-05-20 DIAGNOSIS — I10 Essential (primary) hypertension: Secondary | ICD-10-CM | POA: Insufficient documentation

## 2012-05-20 DIAGNOSIS — Z79899 Other long term (current) drug therapy: Secondary | ICD-10-CM | POA: Insufficient documentation

## 2012-05-20 DIAGNOSIS — Z8639 Personal history of other endocrine, nutritional and metabolic disease: Secondary | ICD-10-CM | POA: Insufficient documentation

## 2012-05-20 DIAGNOSIS — G4730 Sleep apnea, unspecified: Secondary | ICD-10-CM | POA: Insufficient documentation

## 2012-05-20 DIAGNOSIS — IMO0002 Reserved for concepts with insufficient information to code with codable children: Secondary | ICD-10-CM | POA: Insufficient documentation

## 2012-05-20 DIAGNOSIS — Z862 Personal history of diseases of the blood and blood-forming organs and certain disorders involving the immune mechanism: Secondary | ICD-10-CM | POA: Insufficient documentation

## 2012-05-20 DIAGNOSIS — R51 Headache: Secondary | ICD-10-CM | POA: Insufficient documentation

## 2012-05-20 DIAGNOSIS — E876 Hypokalemia: Secondary | ICD-10-CM | POA: Insufficient documentation

## 2012-05-20 DIAGNOSIS — F411 Generalized anxiety disorder: Secondary | ICD-10-CM | POA: Insufficient documentation

## 2012-05-20 DIAGNOSIS — R42 Dizziness and giddiness: Secondary | ICD-10-CM

## 2012-05-20 DIAGNOSIS — E119 Type 2 diabetes mellitus without complications: Secondary | ICD-10-CM | POA: Insufficient documentation

## 2012-05-20 DIAGNOSIS — Z880 Allergy status to penicillin: Secondary | ICD-10-CM | POA: Insufficient documentation

## 2012-05-20 LAB — CBC WITH DIFFERENTIAL/PLATELET
Basophils Absolute: 0 10*3/uL (ref 0.0–0.1)
Eosinophils Absolute: 0.1 10*3/uL (ref 0.0–0.7)
Eosinophils Relative: 2 % (ref 0–5)
HCT: 40.9 % (ref 39.0–52.0)
Lymphocytes Relative: 22 % (ref 12–46)
MCH: 30.9 pg (ref 26.0–34.0)
MCHC: 36.4 g/dL — ABNORMAL HIGH (ref 30.0–36.0)
MCV: 84.9 fL (ref 78.0–100.0)
Monocytes Absolute: 0.5 10*3/uL (ref 0.1–1.0)
Platelets: 228 10*3/uL (ref 150–400)
RDW: 12.5 % (ref 11.5–15.5)

## 2012-05-20 LAB — BASIC METABOLIC PANEL
CO2: 28 mEq/L (ref 19–32)
Calcium: 9.4 mg/dL (ref 8.4–10.5)
Creatinine, Ser: 0.88 mg/dL (ref 0.50–1.35)
GFR calc non Af Amer: >90 mL/min (ref >90)
Sodium: 137 mEq/L (ref 135–145)

## 2012-05-20 MED ORDER — MECLIZINE HCL 12.5 MG PO TABS
25.0000 mg | ORAL_TABLET | Freq: Once | ORAL | Status: AC
  Administered 2012-05-20: 25 mg via ORAL
  Filled 2012-05-20 (×2): qty 1

## 2012-05-20 MED ORDER — POTASSIUM CHLORIDE CRYS ER 20 MEQ PO TBCR: 20.0000 meq | EXTENDED_RELEASE_TABLET | Freq: Two times a day (BID) | ORAL | Status: DC

## 2012-05-20 MED ORDER — MECLIZINE HCL 25 MG PO TABS: 25.0000 mg | ORAL_TABLET | Freq: Three times a day (TID) | ORAL | Status: DC | PRN

## 2012-05-20 MED ORDER — POTASSIUM CHLORIDE CRYS ER 20 MEQ PO TBCR
40.0000 meq | EXTENDED_RELEASE_TABLET | Freq: Once | ORAL | Status: AC
  Administered 2012-05-20: 40 meq via ORAL
  Filled 2012-05-20: qty 2

## 2012-05-20 NOTE — ED Notes (Signed)
Dizzy intermittently for 2 days,  Says he "feels weird".  Head injury 2010, and doctor thinks caused dizziness, but worse last 2 days.  No n/v.  No recent HI.

## 2012-05-20 NOTE — ED Provider Notes (Signed)
History    This chart was scribed for Shelda Jakes, MD by Toya Smothers, ED Scribe. The patient was seen in room APA06/APA06. Patient's care was started at 1644.  CSN: [id number]  Arrival date & time 05/20/12  1644   First MD Initiated Contact with Patient 05/20/12 1728      Chief Complaint  Patient presents with  . Dizziness   Patient is a 62 y.o. male presenting with headaches. The history is provided by the patient. No language interpreter was used.  Headache Pain location:  L temporal and R temporal Quality:  Unable to specify Onset quality:  Gradual Timing:  Constant Progression:  Unchanged Chronicity:  Recurrent Associated symptoms: dizziness   Associated symptoms: no abdominal pain, no back pain, no congestion, no cough, no diarrhea, no fatigue, no numbness, no seizures and no sinus pressure     HPI Comments:  GARTH DIFFLEY is a 62 y.o. male with h/o HTN, DM, and Neuropathic pain, who presents to the Emergency Department complaining of 2 days of recurrent, intermittent, moderate dizziness  descirbed as vertigo. Per Pt, episodes are sequential, approximately 10 minuets in duration, sudden, occur without provocation, and are consistent with previous episodes of dizziness. He reports 2-4 episode yesterday and 2 episodes today. Pt denies headache, diaphoresis, fever, chills, nausea, vomiting, diarrhea, weakness, cough, SOB and any other pain. Pt is a current everyday smoker admitting 2.4 oz alcohol/wk, while denying illicit drug use. No h/o MRI. No recent injury.  PCP: Dr. Gerda Diss    Past Medical History  Diagnosis Date  . Hypertension   . Diabetes mellitus   . Anxiety   . Hypercholesteremia   . Back pain   . Neuropathic pain   . Sleep apnea     Cpap    Past Surgical History  Procedure Laterality Date  . Tracheostomy  2010  . Remote seizure      History reviewed. No pertinent family history.  History  Substance Use Topics  . Smoking status: Former Smoker  -- 2.00 packs/day for 40 years    Types: Cigarettes, Cigars  . Smokeless tobacco: Not on file     Comment: Using electric cigarettes  . Alcohol Use: 2.4 oz/week    4 Cans of beer per week     Review of Systems  Constitutional: Negative for appetite change and fatigue.  HENT: Negative for congestion, sinus pressure and ear discharge.   Eyes: Negative for discharge.  Respiratory: Negative for cough.   Cardiovascular: Negative for chest pain.  Gastrointestinal: Negative for abdominal pain and diarrhea.  Genitourinary: Negative for dysuria, frequency and hematuria.  Musculoskeletal: Negative for back pain.  Skin: Negative for rash.  Neurological: Positive for dizziness, light-headedness and headaches. Negative for tremors, seizures, weakness and numbness.  Psychiatric/Behavioral: Negative for hallucinations and confusion.    Allergies  Aspirin; Lisinopril; and Penicillins  Home Medications   Current Outpatient Rx  Name  Route  Sig  Dispense  Refill  . ALPRAZolam (XANAX) 1 MG tablet   Oral   Take 1 tablet (1 mg total) by mouth at bedtime as needed. Sleep aid   30 tablet   2   . amLODipine (NORVASC) 5 MG tablet   Oral   Take 5 mg by mouth daily.          . citalopram (CELEXA) 40 MG tablet   Oral   Take 40 mg by mouth daily.           . cyclobenzaprine (FLEXERIL) 10  MG tablet   Oral   Take 10 mg by mouth 3 (three) times daily as needed for muscle spasms.          . hydrochlorothiazide (HYDRODIURIL) 25 MG tablet   Oral   Take 25 mg by mouth daily.           . hydrOXYzine (ATARAX/VISTARIL) 25 MG tablet   Oral   Take 25 mg by mouth every 6 (six) hours as needed. itching         . losartan (COZAAR) 50 MG tablet   Oral   Take 50 mg by mouth daily.         . metFORMIN (GLUCOPHAGE) 1000 MG tablet   Oral   Take 1 tablet (1,000 mg total) by mouth 2 (two) times daily with a meal.   75 tablet   6     One tab in am. One-half tablet at lunch. One tab i ...    . oxyCODONE-acetaminophen (PERCOCET/ROXICET) 5-325 MG per tablet   Oral   Take 1 tablet by mouth every 4 (four) hours as needed. pain   30 tablet   0   . EPINEPHrine (EPI-PEN) 0.3 mg/0.3 mL DEVI   Intramuscular   Inject 0.3 mLs (0.3 mg total) into the muscle once.   1 Device   0   . meclizine (ANTIVERT) 25 MG tablet   Oral   Take 1 tablet (25 mg total) by mouth 3 (three) times daily as needed.   30 tablet   0   . potassium chloride SA (K-DUR,KLOR-CON) 20 MEQ tablet   Oral   Take 1 tablet (20 mEq total) by mouth 2 (two) times daily.   10 tablet   0   . sildenafil (VIAGRA) 100 MG tablet   Oral   Take 100 mg by mouth daily as needed for erectile dysfunction.           BP 138/72  Pulse 103  Temp(Src) 98 F (36.7 C)  Physical Exam  Constitutional: He is oriented to person, place, and time. He appears well-developed.  HENT:  Head: Normocephalic.  Eyes: Conjunctivae and EOM are normal. No scleral icterus.  Neck: Neck supple. No thyromegaly present.  Cardiovascular: Normal rate, regular rhythm and normal heart sounds.  Exam reveals no gallop and no friction rub.   No murmur heard. Pulmonary/Chest: Breath sounds normal. No stridor. He has no wheezes. He has no rales. He exhibits no tenderness.  Abdominal: Soft. Bowel sounds are normal. He exhibits no distension. There is no tenderness. There is no rebound.  Musculoskeletal: Normal range of motion. He exhibits no edema.  No pitting edema to ankles  Lymphadenopathy:    He has no cervical adenopathy.  Neurological: He is oriented to person, place, and time. Coordination normal.  Skin: No rash noted. No erythema.  Psychiatric: He has a normal mood and affect. His behavior is normal.    ED Course  Procedures DIAGNOSTIC STUDIES: Oxygen Saturation is 97% on room air, normal by my interpretation.    COORDINATION OF CARE: 17:51- Evaluated Pt. Pt is awake, alert, and without distress. 17:59- Patient understands and  agrees with initial ED impression and plan with expectations set for ED visit. 18:05- Ordered ED EKG, Cardiac Monitoring, CT Head Wo Contrast 1, and DG Chest 2 View 1 time imaging.  Labs Reviewed  CBC WITH DIFFERENTIAL - Abnormal; Notable for the following:    MCHC 36.4 (*)    All other components within normal limits  BASIC  METABOLIC PANEL - Abnormal; Notable for the following:    Potassium 3.2 (*)    Chloride 94 (*)    Glucose, Bld 151 (*)    All other components within normal limits   Dg Chest 2 View  05/20/2012  *RADIOLOGY REPORT*  Clinical Data: Dizziness for 2-3 days, weakness, history hypertension, diabetes  CHEST - 2 VIEW  Comparison: 01/01/2011  Findings: Normal heart size and pulmonary vascularity. Tortuous aorta. Pleural thickening lower right chest with right basilar scarring. Underlying emphysematous changes. No acute infiltrate, pleural effusion, or pneumothorax. No acute osseous findings.  IMPRESSION: COPD with right basilar scarring. No acute abnormalities.   Original Report Authenticated By: Ulyses Southward, M.D.    Ct Head Wo Contrast  05/20/2012  *RADIOLOGY REPORT*  Clinical Data: Dizziness, weakness, history hypertension, diabetes  CT HEAD WITHOUT CONTRAST  Technique:  Contiguous axial images were obtained from the base of the skull through the vertex without contrast.  Comparison: 12/21/2008  Findings: Mild generalized atrophy. Normal ventricular morphology. No midline shift or mass effect. Otherwise normal appearance of brain parenchyma. No intracranial hemorrhage, mass lesion, or acute infarction. Visualized paranasal sinuses clear. Bones unremarkable. Minimal atherosclerotic calcification at carotid siphons.  IMPRESSION: Mild generalized atrophy. No acute intracranial abnormalities.   Original Report Authenticated By: Ulyses Southward, M.D.     Date: 05/20/2012  Rate: 88  Rhythm: normal sinus rhythm  QRS Axis: normal  Intervals: normal  ST/T Wave abnormalities: nonspecific ST/T  changes  Conduction Disutrbances:none  Narrative Interpretation:   Old EKG Reviewed: unchanged No significant changes compared to 01/01/2011  Results for orders placed during the hospital encounter of 05/20/12  CBC WITH DIFFERENTIAL      Result Value Range   WBC 8.0  4.0 - 10.5 K/uL   RBC 4.82  4.22 - 5.81 MIL/uL   Hemoglobin 14.9  13.0 - 17.0 g/dL   HCT 82.9  56.2 - 13.0 %   MCV 84.9  78.0 - 100.0 fL   MCH 30.9  26.0 - 34.0 pg   MCHC 36.4 (*) 30.0 - 36.0 g/dL   RDW 86.5  78.4 - 69.6 %   Platelets 228  150 - 400 K/uL   Neutrophils Relative 69  43 - 77 %   Neutro Abs 5.5  1.7 - 7.7 K/uL   Lymphocytes Relative 22  12 - 46 %   Lymphs Abs 1.8  0.7 - 4.0 K/uL   Monocytes Relative 7  3 - 12 %   Monocytes Absolute 0.5  0.1 - 1.0 K/uL   Eosinophils Relative 2  0 - 5 %   Eosinophils Absolute 0.1  0.0 - 0.7 K/uL   Basophils Relative 0  0 - 1 %   Basophils Absolute 0.0  0.0 - 0.1 K/uL  BASIC METABOLIC PANEL      Result Value Range   Sodium 137  135 - 145 mEq/L   Potassium 3.2 (*) 3.5 - 5.1 mEq/L   Chloride 94 (*) 96 - 112 mEq/L   CO2 28  19 - 32 mEq/L   Glucose, Bld 151 (*) 70 - 99 mg/dL   BUN 10  6 - 23 mg/dL   Creatinine, Ser 2.95  0.50 - 1.35 mg/dL   Calcium 9.4  8.4 - 28.4 mg/dL   GFR calc non Af Amer >90  >90 mL/min   GFR calc Af Amer >90  >90 mL/min     1. Vertigo   2. Hypokalemia       MDM  Workup  for the vertigo is negative. Do not feel the patient does have a CVA. Patient has had been having trouble with vertigo on and off for a long period of time last vent was sometime in the last one to 2 months. Followed by neurology as well or not convinced that it is true vertigo however will treat patient with Antivert also the patient's potassium was low at 3.2 give potassium supplement in the emergency department we'll continue potassium supplement for the next 5 days. Whether this is contributing to the vertigo is not clear at this time. Patient nontoxic no acute  distress.     I personally performed the services described in this documentation, which was scribed in my presence. The recorded information has been reviewed and is accurate.     Shelda Jakes, MD 05/20/12 2013

## 2012-05-24 ENCOUNTER — Ambulatory Visit (INDEPENDENT_AMBULATORY_CARE_PROVIDER_SITE_OTHER): Payer: Medicare Other | Admitting: Family Medicine

## 2012-05-24 ENCOUNTER — Encounter: Payer: Self-pay | Admitting: Family Medicine

## 2012-05-24 VITALS — BP 120/80 | Temp 97.7°F | Ht 64.25 in | Wt 266.2 lb

## 2012-05-24 DIAGNOSIS — E119 Type 2 diabetes mellitus without complications: Secondary | ICD-10-CM

## 2012-05-24 DIAGNOSIS — Z79899 Other long term (current) drug therapy: Secondary | ICD-10-CM

## 2012-05-24 DIAGNOSIS — H8110 Benign paroxysmal vertigo, unspecified ear: Secondary | ICD-10-CM

## 2012-05-24 DIAGNOSIS — I959 Hypotension, unspecified: Secondary | ICD-10-CM

## 2012-05-24 MED ORDER — POTASSIUM CHLORIDE CRYS ER 20 MEQ PO TBCR: EXTENDED_RELEASE_TABLET | ORAL | Status: DC

## 2012-05-24 NOTE — Progress Notes (Signed)
  Subjective:    Patient ID: Dennis Mitchell, male    DOB: [birth year], 62 y.o.   MRN: [id number]  HPI  Patient arrives office for a protracted discussion regarding recent emergency room visit. He developed sudden dizziness. Spinning in nature. Worse when he turns his head one way or the other. All of this has come on since his accident. He fell struck his head. Injured his neck. Seen in the ER. There were concerned about stroke. He did a CT there is no evidence of hemorrhage. His blood work did show a low potassium. He was given vertigo. Patient notes symptoms are improving. No other TIA symptoms. No syncope. No chest pain no shortness of breath. Review of Systems ROS otherwise negative.    Objective:   Physical Exam  Alert no acute distress. Vitals reviewed. Lungs clear. Heart regular in rhythm. HEENT normal. Neuro exam intact.      Assessment & Plan:  Impression 1 vertigo-patient very worried about other possible etiologies. This is a highly likely diagnosis. Discussed at great length. #2 hypertension good control. #3 low potassium discussed. Plan initiate K-Tab. Antivert when necessary for dizziness. Followup regular appointment. All ER notes reviewed. 25 minutes spent most in discussion. WSL

## 2012-06-04 ENCOUNTER — Other Ambulatory Visit: Payer: Self-pay | Admitting: Family Medicine

## 2012-06-07 ENCOUNTER — Other Ambulatory Visit: Payer: Self-pay | Admitting: Family Medicine

## 2012-06-09 ENCOUNTER — Other Ambulatory Visit: Payer: Self-pay | Admitting: Family Medicine

## 2012-06-10 NOTE — Telephone Encounter (Signed)
Office visit 05/24/12 Can he have a refill on Antivert

## 2012-06-27 LAB — BASIC METABOLIC PANEL
BUN: 15 mg/dL (ref 6–23)
Calcium: 9.4 mg/dL (ref 8.4–10.5)
Glucose, Bld: 210 mg/dL — ABNORMAL HIGH (ref 70–99)
Sodium: 132 mEq/L — ABNORMAL LOW (ref 135–145)

## 2012-07-05 ENCOUNTER — Ambulatory Visit: Payer: Medicare Other | Admitting: Family Medicine

## 2012-07-14 ENCOUNTER — Other Ambulatory Visit: Payer: Self-pay | Admitting: Family Medicine

## 2012-07-28 ENCOUNTER — Ambulatory Visit (INDEPENDENT_AMBULATORY_CARE_PROVIDER_SITE_OTHER): Payer: Medicare Other | Admitting: Family Medicine

## 2012-07-28 ENCOUNTER — Encounter: Payer: Self-pay | Admitting: Family Medicine

## 2012-07-28 VITALS — BP 122/80 | HR 80 | Wt 268.4 lb

## 2012-07-28 DIAGNOSIS — H8110 Benign paroxysmal vertigo, unspecified ear: Secondary | ICD-10-CM

## 2012-07-28 DIAGNOSIS — F329 Major depressive disorder, single episode, unspecified: Secondary | ICD-10-CM

## 2012-07-28 DIAGNOSIS — G4733 Obstructive sleep apnea (adult) (pediatric): Secondary | ICD-10-CM

## 2012-07-28 DIAGNOSIS — G8929 Other chronic pain: Secondary | ICD-10-CM

## 2012-07-28 DIAGNOSIS — E119 Type 2 diabetes mellitus without complications: Secondary | ICD-10-CM

## 2012-07-28 DIAGNOSIS — M549 Dorsalgia, unspecified: Secondary | ICD-10-CM

## 2012-07-28 DIAGNOSIS — G4700 Insomnia, unspecified: Secondary | ICD-10-CM

## 2012-07-28 DIAGNOSIS — F32A Depression, unspecified: Secondary | ICD-10-CM

## 2012-07-28 DIAGNOSIS — I10 Essential (primary) hypertension: Secondary | ICD-10-CM

## 2012-07-28 DIAGNOSIS — E785 Hyperlipidemia, unspecified: Secondary | ICD-10-CM

## 2012-07-28 LAB — POCT GLYCOSYLATED HEMOGLOBIN (HGB A1C): Hemoglobin A1C: 8.2

## 2012-07-28 NOTE — Progress Notes (Signed)
  Subjective:    Patient ID: Dennis Mitchell, male    DOB: Oct 16, 1950, 62 y.o.   MRN: [id number]  Diabetes He presents for his follow-up diabetic visit. He has type 2 diabetes mellitus. His disease course has been worsening. Pertinent negatives for hypoglycemia include no confusion or mood changes. Pertinent negatives for diabetes include no blurred vision, no chest pain and no foot paresthesias. Symptoms are worsening. His home blood glucose trend is increasing rapidly. His breakfast blood glucose range is generally 130-140 mg/dl.   Results for orders placed in visit on 07/28/12  POCT GLYCOSYLATED HEMOGLOBIN (HGB A1C)      Result Value Range   Hemoglobin A1C 8.2     Walking some/not a lot. Some shortness of breath with exertion.  Working on bariatric surgery  Taking pain med intermittently. No recent specialist visit  Compliant with blood pressure medications. No obvious side effects.  Patient has had a bump come up on his right heel. Dark in color. Dr. Dian Situ had offered removing it before.  Reports anxiety and depression are clinically stable.  Compliant with his CPAP device. States it definitely helps his sleep apnea.  Review of Systems  Eyes: Negative for blurred vision.  Cardiovascular: Negative for chest pain.  Psychiatric/Behavioral: Negative for confusion.   otherwise negative     Objective:   Physical Exam  Alert obesity evident HEENT normal. Lungs clear. Heart regular in rhythm. Vitals reviewed. Right heel nonspecific hypertrophic hyperpigmented lesion. Arterial pulses present but diminished bilateral positive venous stasis changes noted      Assessment & Plan:  Impression 1 type 2 diabetes suboptimum control discussed #2 hypertension good control. #3 hyperlipidemia see prior notes. #4 sleep apnea clinically stable. #5 chronic back pain ongoing. #6 venous stasis discussed. #7 anxiety depression stable. Plan maintain all above meds. Diet exercise discussed in  encourage. Encouraged to get back with Dr. Dian Situ on heel lesion. Pain medication written out. Recheck in several months. If A1c not better we'll need to make additional changes. Easily 35 minutes spent most in discussion. WSL

## 2012-07-31 DIAGNOSIS — I10 Essential (primary) hypertension: Secondary | ICD-10-CM | POA: Insufficient documentation

## 2012-07-31 DIAGNOSIS — F329 Major depressive disorder, single episode, unspecified: Secondary | ICD-10-CM | POA: Insufficient documentation

## 2012-07-31 DIAGNOSIS — G4733 Obstructive sleep apnea (adult) (pediatric): Secondary | ICD-10-CM | POA: Insufficient documentation

## 2012-08-08 ENCOUNTER — Other Ambulatory Visit: Payer: Self-pay | Admitting: Family Medicine

## 2012-08-18 ENCOUNTER — Other Ambulatory Visit: Payer: Self-pay | Admitting: Family Medicine

## 2012-08-19 ENCOUNTER — Other Ambulatory Visit: Payer: Self-pay | Admitting: Family Medicine

## 2012-08-19 ENCOUNTER — Other Ambulatory Visit: Payer: Self-pay

## 2012-08-30 ENCOUNTER — Emergency Department (HOSPITAL_COMMUNITY)
Admission: EM | Admit: 2012-08-30 | Discharge: 2012-08-30 | Disposition: A | Discharge status: Home / Self Care | Payer: Medicare Other | Attending: Emergency Medicine | Admitting: Emergency Medicine

## 2012-08-30 ENCOUNTER — Encounter (HOSPITAL_COMMUNITY): Payer: Self-pay | Admitting: *Deleted

## 2012-08-30 DIAGNOSIS — Z23 Encounter for immunization: Secondary | ICD-10-CM | POA: Insufficient documentation

## 2012-08-30 DIAGNOSIS — W268XXA Contact with other sharp object(s), not elsewhere classified, initial encounter: Secondary | ICD-10-CM | POA: Insufficient documentation

## 2012-08-30 DIAGNOSIS — Z79899 Other long term (current) drug therapy: Secondary | ICD-10-CM | POA: Insufficient documentation

## 2012-08-30 DIAGNOSIS — Z8639 Personal history of other endocrine, nutritional and metabolic disease: Secondary | ICD-10-CM | POA: Insufficient documentation

## 2012-08-30 DIAGNOSIS — Z8739 Personal history of other diseases of the musculoskeletal system and connective tissue: Secondary | ICD-10-CM | POA: Insufficient documentation

## 2012-08-30 DIAGNOSIS — Y9389 Activity, other specified: Secondary | ICD-10-CM | POA: Insufficient documentation

## 2012-08-30 DIAGNOSIS — Y929 Unspecified place or not applicable: Secondary | ICD-10-CM | POA: Insufficient documentation

## 2012-08-30 DIAGNOSIS — F411 Generalized anxiety disorder: Secondary | ICD-10-CM | POA: Insufficient documentation

## 2012-08-30 DIAGNOSIS — Z880 Allergy status to penicillin: Secondary | ICD-10-CM | POA: Insufficient documentation

## 2012-08-30 DIAGNOSIS — E119 Type 2 diabetes mellitus without complications: Secondary | ICD-10-CM | POA: Insufficient documentation

## 2012-08-30 DIAGNOSIS — S81009A Unspecified open wound, unspecified knee, initial encounter: Secondary | ICD-10-CM | POA: Insufficient documentation

## 2012-08-30 DIAGNOSIS — Z87891 Personal history of nicotine dependence: Secondary | ICD-10-CM | POA: Insufficient documentation

## 2012-08-30 DIAGNOSIS — I10 Essential (primary) hypertension: Secondary | ICD-10-CM | POA: Insufficient documentation

## 2012-08-30 DIAGNOSIS — IMO0002 Reserved for concepts with insufficient information to code with codable children: Secondary | ICD-10-CM

## 2012-08-30 DIAGNOSIS — Z862 Personal history of diseases of the blood and blood-forming organs and certain disorders involving the immune mechanism: Secondary | ICD-10-CM | POA: Insufficient documentation

## 2012-08-30 MED ORDER — LIDOCAINE-EPINEPHRINE 2 %-1:100000 IJ SOLN: 1.7000 mL | Freq: Once | INTRAMUSCULAR | Status: DC

## 2012-08-30 MED ORDER — TETANUS-DIPHTH-ACELL PERTUSSIS 5-2.5-18.5 LF-MCG/0.5 IM SUSP
0.5000 mL | Freq: Once | INTRAMUSCULAR | Status: AC
  Administered 2012-08-30: 0.5 mL via INTRAMUSCULAR
  Filled 2012-08-30: qty 0.5

## 2012-08-30 MED ORDER — LIDOCAINE-EPINEPHRINE (PF) 2 %-1:200000 IJ SOLN
INTRAMUSCULAR | Status: AC
  Filled 2012-08-30: qty 20

## 2012-08-30 NOTE — ED Notes (Signed)
Lac to rt lower leg , cut on piece of tin.

## 2012-08-30 NOTE — ED Provider Notes (Signed)
CSN: [id number]     Arrival date & time 08/30/12  1420 History     First MD Initiated Contact with Patient 08/30/12 1617     Chief Complaint  Patient presents with  . Laceration   (Consider location/radiation/quality/duration/timing/severity/associated sxs/prior Treatment) Patient is a 62 y.o. male presenting with skin laceration. The history is provided by the patient.  Laceration Location:  Leg Leg laceration location:  R ankle Length (cm):  4 Depth:  Cutaneous Bleeding: controlled with pressure   Time since incident:  2 hours Injury mechanism: sharp edge of piece of tin. Pain details:    Quality:  Dull   Severity:  Mild   Timing:  Constant   Progression:  Unchanged Foreign body present:  No foreign bodies Relieved by:  Pressure Worsened by:  Nothing tried Ineffective treatments:  None tried Tetanus status:  Out of date   Past Medical History  Diagnosis Date  . Hypertension   . Diabetes mellitus   . Anxiety   . Hypercholesteremia   . Back pain   . Neuropathic pain   . Sleep apnea     Cpap   Past Surgical History  Procedure Laterality Date  . Tracheostomy  2010  . Remote seizure     History reviewed. No pertinent family history. History  Substance Use Topics  . Smoking status: Former Smoker -- 2.00 packs/day for 40 years    Types: Cigarettes, Cigars  . Smokeless tobacco: Not on file     Comment: Using electric cigarettes  . Alcohol Use: 2.4 oz/week    4 Cans of beer per week    Review of Systems  Constitutional: Negative for fever and chills.  HENT: Negative for facial swelling.   Respiratory: Negative for shortness of breath and wheezing.   Skin: Positive for wound.  Neurological: Negative for numbness.    Allergies  Aspirin; Lisinopril; and Penicillins  Home Medications   Current Outpatient Rx  Name  Route  Sig  Dispense  Refill  . ALPRAZolam (XANAX) 1 MG tablet   Oral   Take 1 tablet (1 mg total) by mouth at bedtime as needed. Sleep  aid   30 tablet   2   . amLODipine (NORVASC) 5 MG tablet   Oral   Take 5 mg by mouth daily.         . citalopram (CELEXA) 40 MG tablet   Oral   Take 40 mg by mouth daily.         . cyclobenzaprine (FLEXERIL) 10 MG tablet   Oral   Take 10 mg by mouth 3 (three) times daily as needed for muscle spasms.          . hydrochlorothiazide (HYDRODIURIL) 25 MG tablet   Oral   Take 25 mg by mouth daily.         Marland Kitchen losartan (COZAAR) 50 MG tablet   Oral   Take 50 mg by mouth daily.         . metFORMIN (GLUCOPHAGE) 1000 MG tablet   Oral   Take 1 tablet (1,000 mg total) by mouth 2 (two) times daily with a meal.   75 tablet   6     One tab in am. One-half tablet at lunch. One tab i ...   . oxyCODONE-acetaminophen (PERCOCET/ROXICET) 5-325 MG per tablet   Oral   Take 1 tablet by mouth every 4 (four) hours as needed. pain   30 tablet   0   . potassium chloride SA (  K-DUR,KLOR-CON) 20 MEQ tablet      One tablet every morning   30 tablet   11   . EPINEPHrine (EPI-PEN) 0.3 mg/0.3 mL DEVI   Intramuscular   Inject 0.3 mLs (0.3 mg total) into the muscle once.   1 Device   0   . VIAGRA 100 MG tablet      TAKE ONE TABLET BY MOUTH APPROXIMATELY ONE HOUR BEFORE SEXUAL ACTIVITY. DO NOT USE MORE THAN ONE DOSE DAILY   8 tablet   0    BP 151/100  Pulse 129  Temp(Src) 97.9 F (36.6 C) (Oral)  Resp 22  Ht 5\' 6"  (1.676 m)  Wt 190 lb (86.183 kg)  BMI 30.68 kg/m2  SpO2 96% Physical Exam  Constitutional: He is oriented to person, place, and time. He appears well-developed and well-nourished.  HENT:  Head: Normocephalic.  Cardiovascular: Normal rate.   Pulmonary/Chest: Effort normal.  Musculoskeletal: He exhibits tenderness.  Neurological: He is alert and oriented to person, place, and time. No sensory deficit.  Skin: Laceration noted.  3 cm linear laceration,  Hemostatic,  Right distal medial ankle. Subcutaneous.    ED Course   Procedures (including critical care  time)   LACERATION REPAIR Performed by: Burgess Amor Authorized by: Burgess Amor Consent: Verbal consent obtained. Risks and benefits: risks, benefits and alternatives were discussed Consent given by: patient Patient identity confirmed: provided demographic data Prepped and Draped in normal sterile fashion Wound explored  Laceration Location: right ankle  Laceration Length: 4cm  No Foreign Bodies seen or palpated  Anesthesia: local infiltration  Local anesthetic: lidocaine 2% with epinephrine  Anesthetic total: 3 ml  Irrigation method: syringe Amount of cleaning: standard  Skin closure: prolene 4-0  Number of sutures: 8  Technique: simple interrupted  Patient tolerance: Patient tolerated the procedure well with no immediate complications.  Labs Reviewed - No data to display No results found. 1. Laceration     MDM  Wound care instructions given.  Pt advised to have sutures removed in 10 days,  Return here sooner for any signs of infection including redness, swelling, worse pain or drainage of pus.     Burgess Amor, PA-C 08/30/12 1728

## 2012-08-30 NOTE — ED Notes (Signed)
Pt presents with laceration to lower right leg. Pt states he was helping move a sheet of tin and the tin fell across his leg.

## 2012-08-31 NOTE — ED Provider Notes (Signed)
Medical screening examination/treatment/procedure(s) were performed by non-physician practitioner and as supervising physician I was immediately available for consultation/collaboration.   Junius Argyle, MD 08/31/12 1106

## 2012-09-06 ENCOUNTER — Other Ambulatory Visit: Payer: Self-pay | Admitting: Family Medicine

## 2012-09-06 NOTE — Telephone Encounter (Signed)
Ok may fill plus 5 ref

## 2012-09-06 NOTE — Telephone Encounter (Signed)
Last office visit 07/28/12

## 2012-09-06 NOTE — Telephone Encounter (Signed)
Ok plus 5 ref 

## 2012-09-27 ENCOUNTER — Ambulatory Visit: Payer: Medicare Other | Admitting: Neurology

## 2012-10-05 ENCOUNTER — Other Ambulatory Visit: Payer: Self-pay

## 2012-10-05 ENCOUNTER — Telehealth: Payer: Self-pay | Admitting: Family Medicine

## 2012-10-05 MED ORDER — OXYCODONE-ACETAMINOPHEN 5-325 MG PO TABS
1.0000 | ORAL_TABLET | ORAL | Status: DC | PRN
Start: 2012-10-05 — End: 2012-11-04

## 2012-10-05 NOTE — Telephone Encounter (Signed)
Will need to ask pt usual amnt we give. wis

## 2012-10-05 NOTE — Telephone Encounter (Signed)
Patient was notified script ready for pickup at front desk.

## 2012-10-05 NOTE — Telephone Encounter (Signed)
Pt would like a refill on this med  oxyCODONE-acetaminophen (PERCOCET/ROXICET) 5-325 MG per tablet

## 2012-10-05 NOTE — Telephone Encounter (Signed)
Last office visit was 07/28/12. According to epic the last time oxycodone was written for patient was on 04/06/12.

## 2012-11-04 ENCOUNTER — Encounter: Payer: Self-pay | Admitting: Family Medicine

## 2012-11-04 ENCOUNTER — Ambulatory Visit (INDEPENDENT_AMBULATORY_CARE_PROVIDER_SITE_OTHER): Payer: Medicare Other | Admitting: Family Medicine

## 2012-11-04 VITALS — BP 130/84 | Ht 67.0 in | Wt 256.2 lb

## 2012-11-04 DIAGNOSIS — M549 Dorsalgia, unspecified: Secondary | ICD-10-CM

## 2012-11-04 DIAGNOSIS — F329 Major depressive disorder, single episode, unspecified: Secondary | ICD-10-CM

## 2012-11-04 DIAGNOSIS — Z23 Encounter for immunization: Secondary | ICD-10-CM

## 2012-11-04 DIAGNOSIS — E119 Type 2 diabetes mellitus without complications: Secondary | ICD-10-CM

## 2012-11-04 DIAGNOSIS — F32A Depression, unspecified: Secondary | ICD-10-CM

## 2012-11-04 DIAGNOSIS — G8929 Other chronic pain: Secondary | ICD-10-CM

## 2012-11-04 DIAGNOSIS — G4700 Insomnia, unspecified: Secondary | ICD-10-CM

## 2012-11-04 DIAGNOSIS — I10 Essential (primary) hypertension: Secondary | ICD-10-CM

## 2012-11-04 DIAGNOSIS — E785 Hyperlipidemia, unspecified: Secondary | ICD-10-CM

## 2012-11-04 DIAGNOSIS — G4733 Obstructive sleep apnea (adult) (pediatric): Secondary | ICD-10-CM

## 2012-11-04 MED ORDER — GLIPIZIDE 5 MG PO TABS: 5.0000 mg | ORAL_TABLET | Freq: Two times a day (BID) | ORAL | Status: DC

## 2012-11-04 MED ORDER — OXYCODONE-ACETAMINOPHEN 5-325 MG PO TABS: 1.0000 | ORAL_TABLET | ORAL | Status: DC | PRN

## 2012-11-04 NOTE — Progress Notes (Signed)
  Subjective:    Patient ID: Dennis Mitchell, male    DOB: September 24, 1950, 62 y.o.   MRN: [id number]  Diabetes He presents for his follow-up diabetic visit. He has type 2 diabetes mellitus. Current diabetic treatment includes oral agent (dual therapy).  . Mostly claims compliance with medications. Compliance with diet.  Back and other pains ongoing. Now taking pain med more regularly. Some days requires two, not all days states that some days he definitely needs to.  Reports some ongoing numbness and tingling in the feet. Worse in the evening.  Claims mood is stable handling the Celexa well no obvious side effects.  Still uses Xanax for insomnia states she definitely needs it  Already had flu shot  Compliant with meds.   Review of Systems No headache no chest pain no back pain no change in bowel habits no blood in stool or urine ROS otherwise negative    Objective:   Physical Exam Alert no acute distress. Vitals stable. Blood pressure good on repeat. Lungs clear. Heart regular rate and rhythm. Ankles chronic venous stasis changes noted.       Assessment & Plan:  Impression #1 type 2 diabetes suboptimum control discussed #2 hypertension good control. #3 COPD stable. #4 insomnia ongoing. #5 chronic pain ongoing worsening needs more of his oxycodone tablets per month. #6 erectile dysfunction discussed

## 2012-11-26 ENCOUNTER — Other Ambulatory Visit: Payer: Self-pay | Admitting: Family Medicine

## 2013-02-08 ENCOUNTER — Telehealth: Payer: Self-pay | Admitting: Family Medicine

## 2013-02-08 ENCOUNTER — Ambulatory Visit: Payer: Medicare Other | Admitting: Family Medicine

## 2013-02-08 ENCOUNTER — Ambulatory Visit (INDEPENDENT_AMBULATORY_CARE_PROVIDER_SITE_OTHER): Payer: Medicare Other | Admitting: Family Medicine

## 2013-02-08 ENCOUNTER — Encounter: Payer: Self-pay | Admitting: Family Medicine

## 2013-02-08 VITALS — BP 138/92 | Ht 66.0 in | Wt 266.0 lb

## 2013-02-08 DIAGNOSIS — E785 Hyperlipidemia, unspecified: Secondary | ICD-10-CM

## 2013-02-08 DIAGNOSIS — M549 Dorsalgia, unspecified: Secondary | ICD-10-CM

## 2013-02-08 DIAGNOSIS — G4700 Insomnia, unspecified: Secondary | ICD-10-CM

## 2013-02-08 DIAGNOSIS — G4733 Obstructive sleep apnea (adult) (pediatric): Secondary | ICD-10-CM

## 2013-02-08 DIAGNOSIS — F32A Depression, unspecified: Secondary | ICD-10-CM

## 2013-02-08 DIAGNOSIS — G8929 Other chronic pain: Secondary | ICD-10-CM

## 2013-02-08 DIAGNOSIS — I10 Essential (primary) hypertension: Secondary | ICD-10-CM

## 2013-02-08 DIAGNOSIS — F3289 Other specified depressive episodes: Secondary | ICD-10-CM

## 2013-02-08 DIAGNOSIS — E119 Type 2 diabetes mellitus without complications: Secondary | ICD-10-CM

## 2013-02-08 DIAGNOSIS — I878 Other specified disorders of veins: Secondary | ICD-10-CM

## 2013-02-08 DIAGNOSIS — N529 Male erectile dysfunction, unspecified: Secondary | ICD-10-CM

## 2013-02-08 DIAGNOSIS — I872 Venous insufficiency (chronic) (peripheral): Secondary | ICD-10-CM

## 2013-02-08 DIAGNOSIS — F329 Major depressive disorder, single episode, unspecified: Secondary | ICD-10-CM

## 2013-02-08 LAB — POCT GLYCOSYLATED HEMOGLOBIN (HGB A1C): HEMOGLOBIN A1C: 6.3

## 2013-02-08 MED ORDER — OXYCODONE-ACETAMINOPHEN 5-325 MG PO TABS: 1.0000 | ORAL_TABLET | Freq: Four times a day (QID) | ORAL | Status: DC | PRN

## 2013-02-08 MED ORDER — SILDENAFIL CITRATE 50 MG PO TABS: ORAL_TABLET | ORAL | Status: DC

## 2013-02-08 MED ORDER — LORAZEPAM 2 MG PO TABS: 2.0000 mg | ORAL_TABLET | Freq: Every evening | ORAL | Status: DC | PRN

## 2013-02-08 NOTE — Telephone Encounter (Signed)
Med sent to [facility]. Pt notified.

## 2013-02-08 NOTE — Telephone Encounter (Signed)
viagra 50 mg generic (I didn't know it was generic) numb ten ref times twelve. One po prn 2 hrs bef sex

## 2013-02-08 NOTE — Progress Notes (Signed)
   Subjective:    Patient ID: Dennis Mitchell, male    DOB: Jun 10, 1950, 63 y.o.   MRN: [id number]  HPI Patient is here today for a diabetic check up. And many other concerns.   Patient notes ongoing erectile dysfunction. States she definitely needs the Viagra. He is hoping he can get a generic form for this. He gets good benefit from the 50 mg dose.  Compliant with diabetes medicine. Tries not to Miss and goes. No recent low sugar spells. Unbelievably his insurance is only covering 1000 mg twice per day of the metformin patient takes his plus a half a tablet at noon and in sheets good sugar controls.  Ongoing difficulties with diminished mid at times. Stable. No suicidal or homicidal thoughts.  Ongoing anxiety and insomnia. Worse at night. Definitely needs medicine. His insurance is no longer covering Xanax.  Results for orders placed in visit on 02/08/13  POCT GLYCOSYLATED HEMOGLOBIN (HGB A1C)      Result Value Range   Hemoglobin A1C 6.3      He states his blood sugars have been WNL. Working hard on diet. Now exercising an hour or more per day.  Patient notes chronic pain primarily in the neck states she definitely needs oxycodone. It helps him.  Compliant with blood pressure medicine. Trying to cut down salt intake.    Review of Systems No chest pain chronic neck pain chronic back pain no abdominal pain no change about habits no blood in stool no rash ROS otherwise negative    Objective:   Physical Exam  Alert mild malaise. Vital stable. Blood pressure good on repeat 130/78 HEENT normal neck some pain with rotation lungs clear no wheezes or crackles heart regular rate and rhythm. Ankles chronic venous stasis changes multiple questions answered during exam regarding this.      Assessment & Plan:  Impression 1 type 2 diabetes A1c actually good control discomfort #2 venous stasis discussed at length. #3 chronic back pain discussed leg. #4 diabetes medicine refused on the part  of his insurance company. Unbelievably they're rejecting full coverage of afford our per month medicine. Discussed at great length. Number next chronic anxiety and insomnia. #6 hypertension good control. #7 hyperlipidemia and discuss plan stop Xanax start Ativan 2 mg each bedtime. Diet exercise discussed. Maintain same meds. We'll work on Lobbyist of metformin. 35-40 minutes spent discussing all the complexities with this patient. Recheck as scheduled. Pain medicines written. WSL

## 2013-02-08 NOTE — Telephone Encounter (Signed)
Patient needs Rx for generic viagra. Please call patient.    [facility]

## 2013-02-17 ENCOUNTER — Telehealth: Payer: Self-pay | Admitting: Family Medicine

## 2013-02-17 NOTE — Telephone Encounter (Signed)
Rx prio auth obtained for pt's METFORMIN 1000mg , 1 in am, 1/2 at lunch, & 1 in pm, was approved through [employer], expires 02/14/14

## 2013-03-20 ENCOUNTER — Other Ambulatory Visit: Payer: Self-pay | Admitting: Family Medicine

## 2013-03-23 ENCOUNTER — Other Ambulatory Visit: Payer: Self-pay | Admitting: Family Medicine

## 2013-03-29 ENCOUNTER — Other Ambulatory Visit: Payer: Self-pay | Admitting: Family Medicine

## 2013-04-05 ENCOUNTER — Other Ambulatory Visit: Payer: Self-pay | Admitting: *Deleted

## 2013-04-05 ENCOUNTER — Telehealth: Payer: Self-pay | Admitting: Family Medicine

## 2013-04-05 MED ORDER — OXYCODONE-ACETAMINOPHEN 5-325 MG PO TABS: 1.0000 | ORAL_TABLET | Freq: Four times a day (QID) | ORAL | Status: DC | PRN

## 2013-04-05 NOTE — Telephone Encounter (Signed)
oxyCODONE-acetaminophen (PERCOCET/ROXICET) 5-325 MG per tablet   Last seen 02/08/13 Last filled  02/08/13

## 2013-04-05 NOTE — Telephone Encounter (Signed)
Last seen 02/08/13. Last filled on feb 3rd #45

## 2013-04-05 NOTE — Telephone Encounter (Signed)
Ok 45 more, I'm surrprised we didn't write multiple last ov

## 2013-04-05 NOTE — Telephone Encounter (Signed)
Scripts ready for pickup. Pt notified on voicemail.

## 2013-05-03 ENCOUNTER — Other Ambulatory Visit: Payer: Self-pay | Admitting: Family Medicine

## 2013-05-09 ENCOUNTER — Encounter: Payer: Self-pay | Admitting: Family Medicine

## 2013-05-09 ENCOUNTER — Ambulatory Visit (INDEPENDENT_AMBULATORY_CARE_PROVIDER_SITE_OTHER): Payer: Medicare Other | Admitting: Family Medicine

## 2013-05-09 VITALS — BP 130/80 | Ht 66.0 in | Wt 263.0 lb

## 2013-05-09 DIAGNOSIS — Z125 Encounter for screening for malignant neoplasm of prostate: Secondary | ICD-10-CM

## 2013-05-09 DIAGNOSIS — E119 Type 2 diabetes mellitus without complications: Secondary | ICD-10-CM

## 2013-05-09 DIAGNOSIS — Z79899 Other long term (current) drug therapy: Secondary | ICD-10-CM

## 2013-05-09 DIAGNOSIS — I10 Essential (primary) hypertension: Secondary | ICD-10-CM

## 2013-05-09 LAB — POCT GLYCOSYLATED HEMOGLOBIN (HGB A1C): Hemoglobin A1C: 5.2

## 2013-05-09 MED ORDER — SILDENAFIL CITRATE 20 MG PO TABS: ORAL_TABLET | ORAL | Status: DC

## 2013-05-09 MED ORDER — OXYCODONE-ACETAMINOPHEN 5-325 MG PO TABS: 1.0000 | ORAL_TABLET | Freq: Four times a day (QID) | ORAL | Status: DC | PRN

## 2013-05-09 MED ORDER — DICLOFENAC SODIUM 1 % TD GEL: TRANSDERMAL | Status: DC

## 2013-05-09 NOTE — Progress Notes (Signed)
   Subjective:    Patient ID: Dennis Mitchell, male    DOB: 03/03/1950, 63 y.o.   MRN: [id number]  Diabetes He presents for his follow-up diabetic visit. He has type 2 diabetes mellitus. His disease course has been stable. There are no hypoglycemic associated symptoms. There are no diabetic associated symptoms. There are no hypoglycemic complications. Symptoms are stable. There are no diabetic complications. There are no known risk factors for coronary artery disease. Current diabetic treatment includes oral agent (dual therapy). He is compliant with treatment all of the time.  Patient states he is having to take his Oxycodone and Flexeril more often now. A1C today is 5.2. Results for orders placed in visit on 05/09/13  POCT GLYCOSYLATED HEMOGLOBIN (HGB A1C)      Result Value Ref Range   Hemoglobin A1C 5.2     Patient states compliant with blood pressure medicine. Number still a good when checked elsewhere. Trying to watch salt in his diet. No obvious side effects.  Ongoing episodes of severe in significant back pain. States she definitely needs a strong pain medicine to help him with this.  Exercise level is so so but hopes to improve moving into springtime   Review of Systems No chest pain no shortness breath no abdominal pain no back pain ROS otherwise negative    Objective:   Physical Exam Alert no apparent distress. H&T normal. Lungs clear. Heart rare rhythm. Ankles 1+ edema chronic venous stasis changes. Sensation intact       Assessment & Plan:  Impression 1 type 2 diabetes control type. #2 chronic back pain and worsening unfortunately. #3 hypertension good control. #4 borderline hyperlipidemia status uncertain. #5 history of depression clinically stable plan appropriate blood work. Increase pain medication. See notes in chart. Diet exercise discussed. Appropriate blood work. Recheck in several months. WSL

## 2013-05-29 ENCOUNTER — Other Ambulatory Visit: Payer: Self-pay | Admitting: Family Medicine

## 2013-06-07 ENCOUNTER — Other Ambulatory Visit: Payer: Self-pay | Admitting: Family Medicine

## 2013-06-14 ENCOUNTER — Encounter (INDEPENDENT_AMBULATORY_CARE_PROVIDER_SITE_OTHER): Payer: Self-pay | Admitting: *Deleted

## 2013-06-14 ENCOUNTER — Other Ambulatory Visit (INDEPENDENT_AMBULATORY_CARE_PROVIDER_SITE_OTHER): Payer: Self-pay | Admitting: *Deleted

## 2013-06-14 DIAGNOSIS — Z1211 Encounter for screening for malignant neoplasm of colon: Secondary | ICD-10-CM

## 2013-06-14 LAB — HM COLONOSCOPY

## 2013-06-19 LAB — LIPID PANEL
CHOLESTEROL: 171 mg/dL (ref 0–200)
HDL: 60 mg/dL (ref >39)
LDL CALC: 95 mg/dL (ref 0–99)
TRIGLYCERIDES: 78 mg/dL (ref <150)
Total CHOL/HDL Ratio: 2.9 Ratio
VLDL: 16 mg/dL (ref 0–40)

## 2013-06-19 LAB — BASIC METABOLIC PANEL
BUN: 18 mg/dL (ref 6–23)
CHLORIDE: 97 meq/L (ref 96–112)
CO2: 29 mEq/L (ref 19–32)
CREATININE: 0.8 mg/dL (ref 0.50–1.35)
Calcium: 8.9 mg/dL (ref 8.4–10.5)
GLUCOSE: 143 mg/dL — AB (ref 70–99)
POTASSIUM: 4 meq/L (ref 3.5–5.3)
Sodium: 133 mEq/L — ABNORMAL LOW (ref 135–145)

## 2013-06-19 LAB — HEPATIC FUNCTION PANEL
ALK PHOS: 62 U/L (ref 39–117)
ALT: 22 U/L (ref 0–53)
AST: 19 U/L (ref 0–37)
Albumin: 4 g/dL (ref 3.5–5.2)
BILIRUBIN DIRECT: 0.1 mg/dL (ref 0.0–0.3)
BILIRUBIN INDIRECT: 0.5 mg/dL (ref 0.2–1.2)
Total Bilirubin: 0.6 mg/dL (ref 0.2–1.2)
Total Protein: 6.7 g/dL (ref 6.0–8.3)

## 2013-06-20 LAB — PSA: PSA: 0.25 ng/mL (ref <=4.00)

## 2013-06-20 LAB — MICROALBUMIN, URINE: Microalb, Ur: 7.33 mg/dL — ABNORMAL HIGH (ref 0.00–1.89)

## 2013-06-25 ENCOUNTER — Other Ambulatory Visit: Payer: Self-pay | Admitting: Family Medicine

## 2013-06-28 ENCOUNTER — Other Ambulatory Visit: Payer: Self-pay | Admitting: Family Medicine

## 2013-07-02 ENCOUNTER — Other Ambulatory Visit: Payer: Self-pay | Admitting: Family Medicine

## 2013-07-17 ENCOUNTER — Other Ambulatory Visit: Payer: Self-pay | Admitting: Family Medicine

## 2013-08-04 ENCOUNTER — Encounter: Payer: Self-pay | Admitting: Family Medicine

## 2013-08-04 ENCOUNTER — Ambulatory Visit (INDEPENDENT_AMBULATORY_CARE_PROVIDER_SITE_OTHER): Payer: Medicare Other | Admitting: Family Medicine

## 2013-08-04 VITALS — BP 130/80 | Resp 20 | Ht 66.0 in | Wt 266.0 lb

## 2013-08-04 DIAGNOSIS — E785 Hyperlipidemia, unspecified: Secondary | ICD-10-CM

## 2013-08-04 DIAGNOSIS — E119 Type 2 diabetes mellitus without complications: Secondary | ICD-10-CM

## 2013-08-04 DIAGNOSIS — I10 Essential (primary) hypertension: Secondary | ICD-10-CM

## 2013-08-04 LAB — POCT GLYCOSYLATED HEMOGLOBIN (HGB A1C): Hemoglobin A1C: 5.9

## 2013-08-04 MED ORDER — OXYCODONE-ACETAMINOPHEN 5-325 MG PO TABS: 1.0000 | ORAL_TABLET | Freq: Four times a day (QID) | ORAL | Status: DC | PRN

## 2013-08-04 MED ORDER — DICLOFENAC SODIUM 75 MG PO TBEC: 75.0000 mg | DELAYED_RELEASE_TABLET | Freq: Two times a day (BID) | ORAL | Status: DC | PRN

## 2013-08-04 NOTE — Progress Notes (Signed)
   Subjective:    Patient ID: Dennis Mitchell, male    DOB: [birth year], 63 y.o.   MRN: [id number]  Diabetes He presents for his follow-up diabetic visit. He has type 2 diabetes mellitus. His disease course has been stable. There are no hypoglycemic associated symptoms. There are no diabetic associated symptoms. There are no hypoglycemic complications. Symptoms are stable. There are no diabetic complications. There are no known risk factors for coronary artery disease. He is compliant with treatment all of the time.   Ongoing pain in the neck s p pain and discomfort in lat neck  Does not rad to the arms  Gets sec headache to fronta. History of cervical spine and thoracic spine fracture posttraumatic.  Results for orders placed in visit on 08/04/13  POCT GLYCOSYLATED HEMOGLOBIN (HGB A1C)      Result Value Ref Range   Hemoglobin A1C 5.9     States definitely needs his 2 pain pills per day. Wonders if he can take anything .  Report sugars overall are running good.   Review of Systems No chest pain. Ongoing neck and back pain. No abdominal pain. No change in bowel habits no blood in stool ROS otherwise negative    Objective:   Physical Exam Alert no apparent distress. Lungs clear. Heart rare in rhythm. Neck positive pain with lateral rotation bilateral. No radiation beyond lateral neck. Lungs clear heart regular in rhythm ankles no significant edema C. diabetic foot exam       Assessment & Plan:  Impression 1 type 2 diabetes good tie control. #2 chronic neck pain. Advised since symptoms and not radicular will only do cervical x-rays. Rationale discussed. #3 hypertension overall good control. #4 hyperlipidemia. Plan diet exercise discussed. Medications as written. Followup as scheduled. WSL

## 2013-08-09 ENCOUNTER — Telehealth: Payer: Self-pay | Admitting: Family Medicine

## 2013-08-09 DIAGNOSIS — M549 Dorsalgia, unspecified: Secondary | ICD-10-CM

## 2013-08-09 NOTE — Telephone Encounter (Signed)
Patient notified via VM xray order is in.

## 2013-08-09 NOTE — Telephone Encounter (Signed)
Patient seen on 08/04/13. He thought he was supposed to have an x-ray done but did not have order for it. Was he supposed to do x-ray?

## 2013-08-11 ENCOUNTER — Ambulatory Visit (HOSPITAL_COMMUNITY)
Admission: RE | Admit: 2013-08-11 | Discharge: 2013-08-11 | Disposition: A | Discharge status: Home / Self Care | Payer: Medicare Other | Source: Ambulatory Visit | Attending: Family Medicine | Admitting: Family Medicine

## 2013-08-11 DIAGNOSIS — M549 Dorsalgia, unspecified: Secondary | ICD-10-CM | POA: Diagnosis present

## 2013-08-11 DIAGNOSIS — M47812 Spondylosis without myelopathy or radiculopathy, cervical region: Secondary | ICD-10-CM | POA: Insufficient documentation

## 2013-08-14 ENCOUNTER — Telehealth (INDEPENDENT_AMBULATORY_CARE_PROVIDER_SITE_OTHER): Payer: Self-pay | Admitting: *Deleted

## 2013-08-14 DIAGNOSIS — Z1211 Encounter for screening for malignant neoplasm of colon: Secondary | ICD-10-CM

## 2013-08-14 NOTE — Telephone Encounter (Signed)
Patient needs movi prep 

## 2013-08-16 MED ORDER — PEG-KCL-NACL-NASULF-NA ASC-C 100 G PO SOLR: 1.0000 | Freq: Once | ORAL | Status: DC

## 2013-08-30 ENCOUNTER — Other Ambulatory Visit: Payer: Self-pay | Admitting: Family Medicine

## 2013-08-31 ENCOUNTER — Telehealth (INDEPENDENT_AMBULATORY_CARE_PROVIDER_SITE_OTHER): Payer: Self-pay | Admitting: *Deleted

## 2013-08-31 NOTE — Telephone Encounter (Signed)
agree

## 2013-08-31 NOTE — Telephone Encounter (Signed)
  Procedure: tcs  Reason/Indication:  screening  Has patient had this procedure before?  Yes, 13 years ago  If so, when, by whom and where?    Is there a family history of colon cancer?  No   Who?  What age when diagnosed?    Is patient diabetic?   yes      Does patient have prosthetic heart valve?  no  Do you have a pacemaker?  no  Has patient ever had endocarditis? no  Has patient had joint replacement within last 12 months?  no  Does patient tend to be constipated or take laxatives? no  Is patient on Coumadin, Plavix and/or Aspirin? no  Medications: see epic  Allergies: see epic  Medication Adjustment: hold metformin & glipizide evening before and morning of  Procedure date & time: 09/20/13 at 930

## 2013-09-01 NOTE — Telephone Encounter (Signed)
Dennis Mitchell patient

## 2013-09-01 NOTE — Telephone Encounter (Signed)
Ok plus five monthly ref 

## 2013-09-05 ENCOUNTER — Encounter (HOSPITAL_COMMUNITY): Payer: Self-pay | Admitting: Pharmacy Technician

## 2013-09-15 ENCOUNTER — Other Ambulatory Visit: Payer: Self-pay | Admitting: Family Medicine

## 2013-09-19 ENCOUNTER — Other Ambulatory Visit (INDEPENDENT_AMBULATORY_CARE_PROVIDER_SITE_OTHER): Payer: Self-pay | Admitting: *Deleted

## 2013-09-19 DIAGNOSIS — Z1211 Encounter for screening for malignant neoplasm of colon: Secondary | ICD-10-CM

## 2013-09-20 ENCOUNTER — Encounter (HOSPITAL_COMMUNITY)
Admission: RE | Disposition: A | Discharge status: Home / Self Care | Payer: Self-pay | Source: Ambulatory Visit | Attending: Internal Medicine

## 2013-09-20 ENCOUNTER — Encounter (HOSPITAL_COMMUNITY): Payer: Self-pay | Admitting: *Deleted

## 2013-09-20 ENCOUNTER — Ambulatory Visit (HOSPITAL_COMMUNITY)
Admission: RE | Admit: 2013-09-20 | Discharge: 2013-09-20 | Disposition: A | Discharge status: Home / Self Care | Payer: Medicare Other | Source: Ambulatory Visit | Attending: Internal Medicine | Admitting: Internal Medicine

## 2013-09-20 DIAGNOSIS — K644 Residual hemorrhoidal skin tags: Secondary | ICD-10-CM

## 2013-09-20 DIAGNOSIS — E7800 Pure hypercholesterolemia, unspecified: Secondary | ICD-10-CM | POA: Insufficient documentation

## 2013-09-20 DIAGNOSIS — M129 Arthropathy, unspecified: Secondary | ICD-10-CM | POA: Diagnosis not present

## 2013-09-20 DIAGNOSIS — Z1211 Encounter for screening for malignant neoplasm of colon: Secondary | ICD-10-CM

## 2013-09-20 DIAGNOSIS — E119 Type 2 diabetes mellitus without complications: Secondary | ICD-10-CM | POA: Diagnosis not present

## 2013-09-20 DIAGNOSIS — Z791 Long term (current) use of non-steroidal anti-inflammatories (NSAID): Secondary | ICD-10-CM | POA: Insufficient documentation

## 2013-09-20 DIAGNOSIS — Z87891 Personal history of nicotine dependence: Secondary | ICD-10-CM | POA: Diagnosis not present

## 2013-09-20 DIAGNOSIS — I10 Essential (primary) hypertension: Secondary | ICD-10-CM | POA: Insufficient documentation

## 2013-09-20 DIAGNOSIS — Z79899 Other long term (current) drug therapy: Secondary | ICD-10-CM | POA: Diagnosis not present

## 2013-09-20 DIAGNOSIS — F411 Generalized anxiety disorder: Secondary | ICD-10-CM | POA: Insufficient documentation

## 2013-09-20 HISTORY — DX: Unspecified osteoarthritis, unspecified site: M19.90

## 2013-09-20 HISTORY — PX: COLONOSCOPY: SHX5424

## 2013-09-20 HISTORY — DX: Unspecified malignant neoplasm of skin of unspecified part of face: C44.300

## 2013-09-20 LAB — GLUCOSE, CAPILLARY: Glucose-Capillary: 160 mg/dL — ABNORMAL HIGH (ref 70–99)

## 2013-09-20 SURGERY — COLONOSCOPY
Anesthesia: Moderate Sedation

## 2013-09-20 MED ORDER — SODIUM CHLORIDE 0.9 % IV SOLN
INTRAVENOUS | Status: DC
  Administered 2013-09-20: 09:00:00 via INTRAVENOUS

## 2013-09-20 MED ORDER — MEPERIDINE HCL 50 MG/ML IJ SOLN
INTRAMUSCULAR | Status: AC
  Filled 2013-09-20: qty 1

## 2013-09-20 MED ORDER — MIDAZOLAM HCL 5 MG/5ML IJ SOLN
INTRAMUSCULAR | Status: AC
  Filled 2013-09-20: qty 10

## 2013-09-20 MED ORDER — MIDAZOLAM HCL 5 MG/5ML IJ SOLN
INTRAMUSCULAR | Status: DC | PRN
Start: 2013-09-20 — End: 2013-09-20
  Administered 2013-09-20 (×3): 2 mg via INTRAVENOUS
  Administered 2013-09-20: 3 mg via INTRAVENOUS

## 2013-09-20 MED ORDER — MEPERIDINE HCL 50 MG/ML IJ SOLN
INTRAMUSCULAR | Status: DC | PRN
  Administered 2013-09-20 (×2): 25 mg via INTRAVENOUS

## 2013-09-20 MED ORDER — SIMETHICONE 40 MG/0.6ML PO SUSP
ORAL | Status: DC | PRN
  Administered 2013-09-20: 09:00:00

## 2013-09-20 NOTE — H&P (Signed)
Dennis Mitchell is an 63 y.o. male.   Chief Complaint: Patient is here for colonoscopy. HPI: Patient is 63 year old Caucasian male with multiple medical problems who is here for screening colonoscopy. He denies abdominal pain change in his father habits or rectal bleeding. Last colonoscopy was 15 years ago. Family history is negative for CRC.  Past Medical History  Diagnosis Date  . Hypertension   . Diabetes mellitus   . Anxiety   . Hypercholesteremia   . Back pain   . Neuropathic pain   . Sleep apnea     Cpap  . Arthritis   . Skin cancer of face     Past Surgical History  Procedure Laterality Date  . Tracheostomy  2010  . Remote seizure      History reviewed. No pertinent family history. Social History:  reports that he has quit smoking. His smoking use included E-cigarettes. He has a 80 pack-year smoking history. He does not have any smokeless tobacco history on file. He reports that he drinks about 2.4 ounces of alcohol per week. He reports that he does not use illicit drugs.  Allergies:  Allergies  Allergen Reactions  . Aspirin   . Lisinopril   . Penicillins Hives    Medications Prior to Admission  Medication Sig Dispense Refill  . amLODipine (NORVASC) 5 MG tablet TAKE ONE TABLET BY MOUTH ONCE DAILY IN THE MORNING  90 tablet  0  . citalopram (CELEXA) 40 MG tablet Take 40 mg by mouth daily.      . cyclobenzaprine (FLEXERIL) 10 MG tablet Take 10 mg by mouth 3 (three) times daily as needed for muscle spasms.       . diclofenac (VOLTAREN) 75 MG EC tablet Take 75 mg by mouth 2 (two) times daily as needed (pain).      Marland Kitchen diclofenac sodium (VOLTAREN) 1 % GEL Apply 2 g topically 2 (two) times daily as needed (pain).      Marland Kitchen glipiZIDE (GLUCOTROL) 5 MG tablet Take by mouth 2 (two) times daily before a meal.      . hydrochlorothiazide (HYDRODIURIL) 25 MG tablet Take 25 mg by mouth daily.      Marland Kitchen LORazepam (ATIVAN) 2 MG tablet Take 2 mg by mouth at bedtime as needed (sleep).      .  metFORMIN (GLUCOPHAGE) 500 MG tablet Take 500 mg by mouth 2 (two) times daily with a meal.      . oxyCODONE-acetaminophen (PERCOCET/ROXICET) 5-325 MG per tablet Take 1 tablet by mouth every 6 (six) hours as needed. pain  60 tablet  0  . peg 3350 powder (MOVIPREP) 100 G SOLR Take 1 kit (200 g total) by mouth once.  1 kit  0  . potassium chloride SA (K-DUR,KLOR-CON) 20 MEQ tablet Take 20 mEq by mouth daily.      Marland Kitchen EPINEPHrine (EPI-PEN) 0.3 mg/0.3 mL DEVI Inject 0.3 mLs (0.3 mg total) into the muscle once.  1 Device  0  . sildenafil (REVATIO) 20 MG tablet Take 5 tablets po prn 2 hours before sex  50 tablet  0  . sildenafil (VIAGRA) 50 MG tablet Take one tablet two hours before sex  10 tablet  11    Results for orders placed during the hospital encounter of 09/20/13 (from the past 48 hour(s))  GLUCOSE, CAPILLARY     Status: Abnormal   Collection Time    09/20/13  9:01 AM      Result Value Ref Range   Glucose-Capillary 160 (*)  70 - 99 mg/dL   No results found.  ROS  Blood pressure 155/104, pulse 78, temperature 98.2 F (36.8 C), temperature source Oral, resp. rate 18, height 5' 6"  (1.676 m), weight 266 lb (120.657 kg), SpO2 96.00%. Physical Exam  Constitutional: He appears well-developed and well-nourished.  HENT:  Mouth/Throat: Oropharynx is clear and moist.  Eyes: Conjunctivae are normal. No scleral icterus.  Neck: No thyromegaly present.  Cardiovascular: Normal rate, regular rhythm and normal heart sounds.   No murmur heard. Respiratory: Effort normal and breath sounds normal.  GI:  protuberant abdomen with umbilical hernia with thinning of skin; hernia is soft and reducible.  Musculoskeletal: He exhibits no edema.  Scot right distal leg in skin pigmentation involving both legs  Lymphadenopathy:    He has no cervical adenopathy.  Neurological: He is alert.  Skin: Skin is warm and dry.     Assessment/Plan Average risk screening colonoscopy.  Clint Strupp U 09/20/2013, 9:13  AM

## 2013-09-20 NOTE — Discharge Instructions (Signed)
Resume usual medications and diet. °No driving for 24 hours. °Next screening exam in 10 years. ° ° °Colonoscopy, Care After °Refer to this sheet in the next few weeks. These instructions provide you with information on caring for yourself after your procedure. Your health care provider may also give you more specific instructions. Your treatment has been planned according to current medical practices, but problems sometimes occur. Call your health care provider if you have any problems or questions after your procedure. °WHAT TO EXPECT AFTER THE PROCEDURE  °After your procedure, it is typical to have the following: °· A small amount of blood in your stool. °· Moderate amounts of gas and mild abdominal cramping or bloating. °HOME CARE INSTRUCTIONS °· Do not drive, operate machinery, or sign important documents for 24 hours. °· You may shower and resume your regular physical activities, but move at a slower pace for the first 24 hours. °· Take frequent rest periods for the first 24 hours. °· Walk around or put a warm pack on your abdomen to help reduce abdominal cramping and bloating. °· Drink enough fluids to keep your urine clear or pale yellow. °· You may resume your normal diet as instructed by your health care provider. Avoid heavy or fried foods that are hard to digest. °· Avoid drinking alcohol for 24 hours or as instructed by your health care provider. °· Only take over-the-counter or prescription medicines as directed by your health care provider. °· If a tissue sample (biopsy) was taken during your procedure: °¨ Do not take aspirin or blood thinners for 7 days, or as instructed by your health care provider. °¨ Do not drink alcohol for 7 days, or as instructed by your health care provider. °¨ Eat soft foods for the first 24 hours. °SEEK MEDICAL CARE IF: °You have persistent spotting of blood in your stool 2-3 days after the procedure. °SEEK IMMEDIATE MEDICAL CARE IF: °· You have more than a small spotting of  blood in your stool. °· You pass large blood clots in your stool. °· Your abdomen is swollen (distended). °· You have nausea or vomiting. °· You have a fever. °· You have increasing abdominal pain that is not relieved with medicine. °Document Released: 08/13/2003 Document Revised: 10/19/2012 Document Reviewed: 09/05/2012 °ExitCare® Patient Information ©2015 [employer]. This information is not intended to replace advice given to you by your health care provider. Make sure you discuss any questions you have with your health care provider. ° °

## 2013-09-20 NOTE — Op Note (Signed)
COLONOSCOPY PROCEDURE REPORT  PATIENT:  Dennis Mitchell  MR#:  [id number] Birthdate:  01-Jan-1951, 63 y.o., male Endoscopist:  Dr. Rogene Houston, MD Referred By:  Dr. Grace Bushy. Dennis Phoenix, MD  Procedure Date: 09/20/2013  Procedure:   Colonoscopy  Indications:  Patient is 63 year-old Caucasian male who is undergoing average risk screening colonoscopy.  Informed Consent:  The procedure and risks were reviewed with the patient and informed consent was obtained.  Medications:  Demerol 50 mg IV Versed 9 mg IV  Description of procedure:  After a digital rectal exam was performed, that colonoscope was advanced from the anus through the rectum and colon to the area of the cecum, ileocecal valve and appendiceal orifice. The cecum was deeply intubated. These structures were well-seen and photographed for the record. From the level of the cecum and ileocecal valve, the scope was slowly and cautiously withdrawn. The mucosal surfaces were carefully surveyed utilizing scope tip to flexion to facilitate fold flattening as needed. The scope was pulled down into the rectum where a thorough exam including retroflexion was performed.  Findings:  Prep satisfactory. Normal mucosa of cecum, ascending colon and, hepatic flexure, transverse colon, splenic flexure, descending and sigmoid colon. Normal rectal mucosa. Small hemorrhoids below the dentate line.   Therapeutic/Diagnostic Maneuvers Performed:  None  Complications:  None  Cecal Withdrawal Time:  10 minutes  Impression:  Normal colonoscopy except small external hemorrhoids.  Recommendations:  Standard instructions given. Next screening exam in 10 years.  REHMAN,NAJEEB U  09/20/2013 9:49 AM  CC: Dr. Rubbie Battiest, MD & Dr. Rayne Du ref. provider found

## 2013-09-22 ENCOUNTER — Encounter (HOSPITAL_COMMUNITY): Payer: Self-pay | Admitting: Internal Medicine

## 2013-09-25 ENCOUNTER — Ambulatory Visit (INDEPENDENT_AMBULATORY_CARE_PROVIDER_SITE_OTHER): Payer: Medicare Other | Admitting: Family Medicine

## 2013-09-25 ENCOUNTER — Encounter: Payer: Self-pay | Admitting: Family Medicine

## 2013-09-25 VITALS — BP 122/86 | Temp 98.2°F | Ht 66.0 in | Wt 270.2 lb

## 2013-09-25 DIAGNOSIS — J029 Acute pharyngitis, unspecified: Secondary | ICD-10-CM

## 2013-09-25 LAB — POCT RAPID STREP A (OFFICE): RAPID STREP A SCREEN: NEGATIVE

## 2013-09-25 MED ORDER — CLARITHROMYCIN 500 MG PO TABS: 500.0000 mg | ORAL_TABLET | Freq: Two times a day (BID) | ORAL | Status: DC

## 2013-09-25 NOTE — Progress Notes (Signed)
   Subjective:    Patient ID: Dennis Mitchell, male    DOB: [birth year], 63 y.o.   MRN: [id number]  Sore Throat  This is a new problem. The current episode started 1 to 4 weeks ago. The problem has been unchanged. The pain is worse on the left side. There has been no fever. The pain is at a severity of 6/10. The pain is moderate. Associated symptoms include trouble swallowing. He has tried nothing for the symptoms. The treatment provided no relief.   Patient states that he has no other concerns at this time.   Allergies some in fall but not bad  Basal cell skin ca removed recently right cheek. Results for orders placed in visit on 09/25/13  POCT RAPID STREP A (OFFICE)      Result Value Ref Range   Rapid Strep A Screen Negative  Negative    Also still having left lateral neck pain with motion. Wonders if related.  Has not seen and tenderness for some time Review of Systems  HENT: Positive for trouble swallowing.    No fever no chills    Objective:   Physical Exam Alert no apparent distress. Lungs clear. Heart regular in rhythm. H&T pharynx slight erythema no obvious tenderness TMJ normal left posterior cervical tenderness to rotation. Lungs clear heart regular in rhythm.       Assessment & Plan:  Impression 1 subacute pharyngitis complicated by cervical pain which still is musculoskeletal in description plan Biaxin 500 twice a day 10 days. Symptomatic care discussed. WSL

## 2013-09-26 LAB — STREP A DNA PROBE: GASP: NEGATIVE

## 2013-10-19 ENCOUNTER — Other Ambulatory Visit: Payer: Self-pay | Admitting: Family Medicine

## 2013-10-23 ENCOUNTER — Other Ambulatory Visit: Payer: Self-pay | Admitting: Family Medicine

## 2013-11-07 ENCOUNTER — Ambulatory Visit (INDEPENDENT_AMBULATORY_CARE_PROVIDER_SITE_OTHER): Payer: Medicare Other | Admitting: Family Medicine

## 2013-11-07 ENCOUNTER — Encounter: Payer: Self-pay | Admitting: Family Medicine

## 2013-11-07 VITALS — BP 128/90 | Ht 66.0 in | Wt 274.1 lb

## 2013-11-07 DIAGNOSIS — G8929 Other chronic pain: Secondary | ICD-10-CM

## 2013-11-07 DIAGNOSIS — I10 Essential (primary) hypertension: Secondary | ICD-10-CM

## 2013-11-07 DIAGNOSIS — E119 Type 2 diabetes mellitus without complications: Secondary | ICD-10-CM

## 2013-11-07 DIAGNOSIS — M549 Dorsalgia, unspecified: Secondary | ICD-10-CM

## 2013-11-07 DIAGNOSIS — G4733 Obstructive sleep apnea (adult) (pediatric): Secondary | ICD-10-CM

## 2013-11-07 DIAGNOSIS — Z23 Encounter for immunization: Secondary | ICD-10-CM

## 2013-11-07 LAB — POCT GLYCOSYLATED HEMOGLOBIN (HGB A1C): Hemoglobin A1C: 5.7

## 2013-11-07 MED ORDER — OXYCODONE-ACETAMINOPHEN 5-325 MG PO TABS: 1.0000 | ORAL_TABLET | Freq: Four times a day (QID) | ORAL | Status: DC | PRN

## 2013-11-07 NOTE — Progress Notes (Signed)
   Subjective:    Patient ID: Dennis Mitchell, male    DOB: 07/27/1950, 63 y.o.   MRN: [id number]  Diabetes He presents for his follow-up diabetic visit. He has type 2 diabetes mellitus. His disease course has been stable. There are no hypoglycemic associated symptoms. There are no diabetic associated symptoms. There are no hypoglycemic complications. Symptoms are stable. There are no diabetic complications. There are no known risk factors for coronary artery disease. Current diabetic treatment includes oral agent (dual therapy). He is compliant with treatment all of the time.  Patient states he has no concerns at this time.  A1C today is 5.7.   Walking regularly  Comp with bp meds, no evidence of se 's frm the med  Tends to have cong and drainage this time of yr. In the past sees use Allegra or Claritin with some assistance.  States ongoing need for pain medication. Chronic pain. Medication helps patient function better.  Ongoing challenges with sleep apnea uses device faithfully   Review of Systems No significant headache no chest pain no abdominal pain or change in bowel habits no blood in stool    Objective:   Physical Exam  Alert no apparent distress HEENT slight nasal congestion lungs clear heart regular rate and rhythm blood pressure good on repeat ankles venous stasis changes noted 1+ edema      Assessment & Plan:  Impression 1 type 2 diabetes controlled good discussed #2 hypertension good control. #3 sleep apnea discussed #4 venous stasis discuss #5 chronic pain with need for maintaining medications discussed #6 allergic rhinitis encouraged to take away per etc. plan Allegra when necessary. Flu vaccine today. Pain medication written. Diet exercise discussed. Recheck in several months. W

## 2013-11-30 ENCOUNTER — Other Ambulatory Visit: Payer: Self-pay | Admitting: Family Medicine

## 2013-12-11 ENCOUNTER — Other Ambulatory Visit: Payer: Self-pay | Admitting: Family Medicine

## 2013-12-23 ENCOUNTER — Other Ambulatory Visit: Payer: Self-pay | Admitting: Family Medicine

## 2014-01-07 ENCOUNTER — Other Ambulatory Visit: Payer: Self-pay | Admitting: Family Medicine

## 2014-01-15 ENCOUNTER — Other Ambulatory Visit: Payer: Self-pay | Admitting: Family Medicine

## 2014-01-27 ENCOUNTER — Other Ambulatory Visit: Payer: Self-pay | Admitting: Family Medicine

## 2014-01-31 ENCOUNTER — Other Ambulatory Visit (INDEPENDENT_AMBULATORY_CARE_PROVIDER_SITE_OTHER): Payer: Self-pay

## 2014-02-01 ENCOUNTER — Other Ambulatory Visit (INDEPENDENT_AMBULATORY_CARE_PROVIDER_SITE_OTHER): Payer: Self-pay

## 2014-02-01 ENCOUNTER — Other Ambulatory Visit (INDEPENDENT_AMBULATORY_CARE_PROVIDER_SITE_OTHER): Payer: Self-pay | Admitting: Surgery

## 2014-02-05 ENCOUNTER — Ambulatory Visit: Payer: Medicare Other | Admitting: Family Medicine

## 2014-02-06 ENCOUNTER — Other Ambulatory Visit (INDEPENDENT_AMBULATORY_CARE_PROVIDER_SITE_OTHER): Payer: Self-pay

## 2014-02-07 ENCOUNTER — Encounter: Payer: Self-pay | Admitting: Family Medicine

## 2014-02-07 ENCOUNTER — Ambulatory Visit (INDEPENDENT_AMBULATORY_CARE_PROVIDER_SITE_OTHER): Payer: BLUE CROSS/BLUE SHIELD | Admitting: Family Medicine

## 2014-02-07 VITALS — BP 148/98 | Ht 67.0 in | Wt 274.0 lb

## 2014-02-07 DIAGNOSIS — G8929 Other chronic pain: Secondary | ICD-10-CM

## 2014-02-07 DIAGNOSIS — I10 Essential (primary) hypertension: Secondary | ICD-10-CM

## 2014-02-07 DIAGNOSIS — E119 Type 2 diabetes mellitus without complications: Secondary | ICD-10-CM

## 2014-02-07 DIAGNOSIS — M549 Dorsalgia, unspecified: Secondary | ICD-10-CM

## 2014-02-07 LAB — POCT GLYCOSYLATED HEMOGLOBIN (HGB A1C): HEMOGLOBIN A1C: 6.1

## 2014-02-07 MED ORDER — OXYCODONE-ACETAMINOPHEN 5-325 MG PO TABS: 1.0000 | ORAL_TABLET | Freq: Four times a day (QID) | ORAL | Status: DC | PRN

## 2014-02-07 NOTE — Progress Notes (Signed)
   Subjective:    Patient ID: Dennis Mitchell, male    DOB: 1950/11/09, 64 y.o.   MRN: [id number]  Diabetes He presents for his follow-up diabetic visit. He has type 2 diabetes mellitus. Hypoglycemia symptoms include dizziness. There are no diabetic associated symptoms. Symptoms are stable. Current diabetic treatment includes oral agent (dual therapy). He is compliant with treatment all of the time. His highest blood glucose is 110-130 mg/dl. His overall blood glucose range is 90-110 mg/dl. He sees a podiatrist.Eye exam is not current.   BP numbers often a little hi. Claims complete compliance with blood pressure medication. Watching salt intake. No obvious side effects from medicine. Does not miss a dose.  Ongoing chronic pain primarily neck and back. See prior notes. Definitely needs narcotic pain medicine in order to continue his usual activities.  Eye doc still pending   Review of Systems  Neurological: Positive for dizziness.   No headache no chest pain no abdominal pain no change in bowel habits no blood in stool ROS otherwise negative    Objective:   Physical Exam  Alert moderate malaise. Vitals stable. HEENT normal. Lungs clear no crackles heart rare rhythm neck pain with rotation low back pain with percussion ankles without edema feet sensation currently intact      Assessment & Plan:  Impression type 2 diabetes.this. Controlled very good. #2 hypertension good control blood pressure good on repeat #3 chronic pain ongoing plan medications refilled. Diet exercise discussed. Encouraged to see eye doctor. Follow-up in several months. WSL

## 2014-02-14 ENCOUNTER — Other Ambulatory Visit (INDEPENDENT_AMBULATORY_CARE_PROVIDER_SITE_OTHER): Payer: Self-pay

## 2014-02-15 ENCOUNTER — Other Ambulatory Visit: Payer: Self-pay | Admitting: Family Medicine

## 2014-03-01 ENCOUNTER — Ambulatory Visit (HOSPITAL_COMMUNITY)
Admission: RE | Admit: 2014-03-01 | Discharge: 2014-03-01 | Disposition: A | Discharge status: Home / Self Care | Payer: Medicare Other | Source: Ambulatory Visit | Attending: Surgery | Admitting: Surgery

## 2014-03-01 ENCOUNTER — Encounter (HOSPITAL_COMMUNITY)
Admission: RE | Disposition: A | Discharge status: Home / Self Care | Payer: Medicare Other | Source: Ambulatory Visit | Attending: Surgery

## 2014-03-01 HISTORY — PX: BREATH TEK H PYLORI: SHX5422

## 2014-03-01 SURGERY — BREATH TEST, FOR HELICOBACTER PYLORI

## 2014-03-01 NOTE — Progress Notes (Signed)
   03/01/14 [place]  Referring MD Lucia Gaskins  Time of Last PO Intake 1900  Baseline Breath At: 0810  Pranactin Given At: 0810  Post-Dose Breath At: 0825  Sample 1 4.9  Sample 2 2.1  Test Negative

## 2014-03-02 ENCOUNTER — Encounter (HOSPITAL_COMMUNITY): Payer: Self-pay | Admitting: Surgery

## 2014-03-05 ENCOUNTER — Other Ambulatory Visit: Payer: Self-pay | Admitting: Family Medicine

## 2014-03-05 NOTE — Telephone Encounter (Signed)
[facility] patient

## 2014-03-06 ENCOUNTER — Encounter: Payer: Medicare Other | Attending: Surgery | Admitting: Dietician

## 2014-03-06 ENCOUNTER — Encounter: Payer: Self-pay | Admitting: Dietician

## 2014-03-06 DIAGNOSIS — Z713 Dietary counseling and surveillance: Secondary | ICD-10-CM | POA: Diagnosis not present

## 2014-03-06 DIAGNOSIS — Z6841 Body Mass Index (BMI) 40.0 and over, adult: Secondary | ICD-10-CM | POA: Diagnosis not present

## 2014-03-06 NOTE — Progress Notes (Signed)
  Pre-Op Assessment Visit:  Pre-Operative LAGB Surgery  Medical Nutrition Therapy:  Appt start time: 0277   End time:  1215.  Patient was seen on 03/06/2014 for Pre-Operative Nutrition Assessment. Assessment and letter of approval faxed to [facility] Bariatric Surgery Program coordinator on 03/06/2014.   Preferred Learning Style:   No preference indicated   Learning Readiness:   Ready  Handouts given during visit include:  Pre-Op Goals Bariatric Surgery Protein Shakes   During the appointment today the following Pre-Op Goals were reviewed with the patient: Maintain or lose weight as instructed by your surgeon Make healthy food choices Begin to limit portion sizes Limited concentrated sugars and fried foods Keep fat/sugar in the single digits per serving on   food labels Practice CHEWING your food  (aim for 30 chews per bite or until applesauce consistency) Practice not drinking 15 minutes before, during, and 30 minutes after each meal/snack Avoid all carbonated beverages  Avoid/limit caffeinated beverages  Avoid all sugar-sweetened beverages Consume 3 meals per day; eat every 3-5 hours Make a list of non-food related activities Aim for 64-100 ounces of FLUID daily  Aim for at least 60-80 grams of PROTEIN daily Look for a liquid protein source that contain ?15 g protein and ?5 g carbohydrate  (ex: shakes, drinks, shots)  Patient-Centered Goals: -More energy -Better mobility -Less medications   Scale of 1-10: confidence (8) /importance scale (10)  Demonstrated degree of understanding via:  Teach Back  Teaching Method Utilized:  Visual Auditory Hands on  Barriers to learning/adherence to lifestyle change: none  Patient to call the [facility] to enroll in Pre-Op and Post-Op Nutrition Education when surgery date is scheduled.

## 2014-03-07 ENCOUNTER — Ambulatory Visit (HOSPITAL_COMMUNITY)
Admission: RE | Admit: 2014-03-07 | Discharge: 2014-03-07 | Disposition: A | Discharge status: Home / Self Care | Payer: Medicare Other | Source: Ambulatory Visit | Attending: Surgery | Admitting: Surgery

## 2014-03-07 DIAGNOSIS — F419 Anxiety disorder, unspecified: Secondary | ICD-10-CM | POA: Diagnosis not present

## 2014-03-07 DIAGNOSIS — K449 Diaphragmatic hernia without obstruction or gangrene: Secondary | ICD-10-CM | POA: Diagnosis not present

## 2014-03-07 DIAGNOSIS — I10 Essential (primary) hypertension: Secondary | ICD-10-CM | POA: Insufficient documentation

## 2014-03-07 DIAGNOSIS — K224 Dyskinesia of esophagus: Secondary | ICD-10-CM | POA: Insufficient documentation

## 2014-03-07 DIAGNOSIS — G4730 Sleep apnea, unspecified: Secondary | ICD-10-CM | POA: Diagnosis not present

## 2014-03-07 DIAGNOSIS — E119 Type 2 diabetes mellitus without complications: Secondary | ICD-10-CM | POA: Diagnosis not present

## 2014-03-07 DIAGNOSIS — E780 Pure hypercholesterolemia: Secondary | ICD-10-CM | POA: Diagnosis not present

## 2014-03-07 DIAGNOSIS — Z6841 Body Mass Index (BMI) 40.0 and over, adult: Secondary | ICD-10-CM | POA: Insufficient documentation

## 2014-04-03 ENCOUNTER — Encounter: Payer: Medicare Other | Attending: Surgery | Admitting: Dietician

## 2014-04-03 DIAGNOSIS — Z713 Dietary counseling and surveillance: Secondary | ICD-10-CM | POA: Insufficient documentation

## 2014-04-03 DIAGNOSIS — Z6841 Body Mass Index (BMI) 40.0 and over, adult: Secondary | ICD-10-CM | POA: Diagnosis not present

## 2014-04-03 NOTE — Progress Notes (Signed)
  Supervised Weight Loss:  Appt start time: 1120 end time:  1135.  SWL visit 1:  Primary concerns today: Dennis Mitchell is here for his 1st SWL visit out of 6 in preparation for bariatric surgery. He has gained 6 pounds since his assessment. He states he has not been as active outside lately.   Weight: 277.8   Plan:  -Start weaning off of caffeine -Work on drinking more water -Work on not drinking while eating   MEDICATIONS: see list  DIETARY INTAKE:  24-hr recall:  B (AM): yogurt  Snk (AM) :  L (PM): [employer] salad or chicken sandwich  Snk ( PM):  D (PM): salad with chicken or fish  Snk ( PM): bagel  Beverages: 4-5 bottles of water per day, coffee  Recent physical activity: none  Estimated energy needs: 1800-2000 calories 200-225 g carbohydrates  Progress Towards Goal(s):  In progress.   Nutritional Diagnosis:  [name]-3.3 Overweight/obesity related to past poor dietary habits and physical inactivity as evidenced by patient in SWL for pending bariatric surgery following dietary guidelines for continued weight loss.     Intervention:  Nutrition counseling provided.  Monitoring/Evaluation:  Dietary intake, exercise, and body weight in 4 week(s).

## 2014-04-11 ENCOUNTER — Other Ambulatory Visit: Payer: Self-pay | Admitting: Family Medicine

## 2014-04-12 ENCOUNTER — Other Ambulatory Visit: Payer: Self-pay | Admitting: Family Medicine

## 2014-04-13 NOTE — Telephone Encounter (Signed)
Ok plus five nmonthly ref

## 2014-05-02 ENCOUNTER — Other Ambulatory Visit: Payer: Self-pay | Admitting: Family Medicine

## 2014-05-03 ENCOUNTER — Encounter: Payer: Medicare Other | Attending: Surgery | Admitting: Dietician

## 2014-05-03 DIAGNOSIS — Z6841 Body Mass Index (BMI) 40.0 and over, adult: Secondary | ICD-10-CM | POA: Diagnosis not present

## 2014-05-03 DIAGNOSIS — Z713 Dietary counseling and surveillance: Secondary | ICD-10-CM | POA: Diagnosis not present

## 2014-05-03 NOTE — Progress Notes (Signed)
  Supervised Weight Loss:  Appt start time: 1200 end time:  1215  SWL visit 2:  Primary concerns today: Dennis Mitchell is here for his 2nd SWL visit out of 6 in preparation for bariatric surgery. He has lost 1 pound since last visit. He states he has not been as active lately due to issues with vertigo. Has completely cut out all caffeine. Has also been practicing not drinking while eating; finds this difficult so he tries to avoid even having a drink nearby while he is eating.   Weight: 276.7  Plan: Work on chewing thoroughly and eating slowly   MEDICATIONS: see list  DIETARY INTAKE:  24-hr recall:  B (AM): yogurt  Snk (AM) :  L (PM): [employer] salad or chicken sandwich  Snk ( PM):  D (PM): salad with chicken or fish  Snk ( PM): bagel  Beverages: 4-5 bottles of water per day, decaf coffee and tea with artificial sweetener  Recent physical activity: none  Estimated energy needs: 1800-2000 calories 200-225 g carbohydrates  Progress Towards Goal(s):  In progress.   Nutritional Diagnosis:  -3.3 Overweight/obesity related to past poor dietary habits and physical inactivity as evidenced by patient in SWL for pending bariatric surgery following dietary guidelines for continued weight loss.     Intervention:  Nutrition counseling provided.  Monitoring/Evaluation:  Dietary intake, exercise, and body weight in 4 week(s).

## 2014-05-09 ENCOUNTER — Ambulatory Visit (INDEPENDENT_AMBULATORY_CARE_PROVIDER_SITE_OTHER): Payer: Medicare Other | Admitting: Family Medicine

## 2014-05-09 ENCOUNTER — Encounter: Payer: Self-pay | Admitting: Family Medicine

## 2014-05-09 VITALS — BP 136/82 | Ht 67.0 in | Wt 277.8 lb

## 2014-05-09 DIAGNOSIS — Z125 Encounter for screening for malignant neoplasm of prostate: Secondary | ICD-10-CM | POA: Diagnosis not present

## 2014-05-09 DIAGNOSIS — Z79899 Other long term (current) drug therapy: Secondary | ICD-10-CM

## 2014-05-09 DIAGNOSIS — I878 Other specified disorders of veins: Secondary | ICD-10-CM | POA: Diagnosis not present

## 2014-05-09 DIAGNOSIS — E785 Hyperlipidemia, unspecified: Secondary | ICD-10-CM

## 2014-05-09 DIAGNOSIS — M549 Dorsalgia, unspecified: Secondary | ICD-10-CM

## 2014-05-09 DIAGNOSIS — E119 Type 2 diabetes mellitus without complications: Secondary | ICD-10-CM | POA: Diagnosis not present

## 2014-05-09 DIAGNOSIS — G8929 Other chronic pain: Secondary | ICD-10-CM

## 2014-05-09 LAB — POCT GLYCOSYLATED HEMOGLOBIN (HGB A1C): Hemoglobin A1C: 6.6

## 2014-05-09 MED ORDER — OXYCODONE-ACETAMINOPHEN 5-325 MG PO TABS: 1.0000 | ORAL_TABLET | Freq: Four times a day (QID) | ORAL | Status: DC | PRN

## 2014-05-09 NOTE — Progress Notes (Signed)
   Subjective:    Patient ID: Dennis Mitchell, male    DOB: [birth year], 64 y.o.   MRN: [id number]  Diabetes He presents for his follow-up diabetic visit. He has type 2 diabetes mellitus. Risk factors for coronary artery disease include hypertension, obesity and diabetes mellitus. Current diabetic treatment includes oral agent (dual therapy). He is compliant with treatment all of the time. He is following a diabetic diet. He has had a previous visit with a dietitian. He sees a podiatrist.Eye exam is not current.   Results for orders placed or performed in visit on 05/09/14  POCT glycosylated hemoglobin (Hb A1C)  Result Value Ref Range   Hemoglobin A1C 6.6     Most suggars 100 or 105, highest 110  Usually prety good  Watching diet pretty well  Walking and exrtcise doing some but not neearly enough  Knees bother pt some  Eye do c visit Patient claims compliance with blood pressure medication. No obvious side effects. Watching salt intake.  Notes right foot is swelling more than usual. History of substantial right low leg injury see prior notes.  Ongoing chronic pain. States she definitely needs his pain medication to function appropriately. Primarily in the back Review of Systems    no chest pain no headache no change in bowel habits no blood in stool Objective:   Physical Exam Alert vital stable high BMI blood pressure good. HEENT normal. Lungs clear heart regular in rhythm. Both legs bilateral venous stasis changes right foot edema noted foot exam see chart       Assessment & Plan:  Impression 1 type 2 diabetes good control #2 hypertension good control #3 venous stasis exacerbated by old injury discussed #4 chronic pain with need for medications plan medicines refilled. Diet exercise discussed. Recheck in several months WSL

## 2014-05-14 ENCOUNTER — Other Ambulatory Visit: Payer: Self-pay | Admitting: Family Medicine

## 2014-05-14 NOTE — Telephone Encounter (Signed)
Ok plus five mo ref 

## 2014-05-22 ENCOUNTER — Other Ambulatory Visit: Payer: Self-pay | Admitting: Family Medicine

## 2014-05-28 ENCOUNTER — Other Ambulatory Visit: Payer: Self-pay | Admitting: Family Medicine

## 2014-05-31 ENCOUNTER — Encounter: Payer: Medicare Other | Attending: Surgery | Admitting: Dietician

## 2014-05-31 ENCOUNTER — Encounter: Payer: Self-pay | Admitting: Dietician

## 2014-05-31 DIAGNOSIS — Z6841 Body Mass Index (BMI) 40.0 and over, adult: Secondary | ICD-10-CM | POA: Insufficient documentation

## 2014-05-31 DIAGNOSIS — Z713 Dietary counseling and surveillance: Secondary | ICD-10-CM | POA: Insufficient documentation

## 2014-05-31 NOTE — Progress Notes (Signed)
  Supervised Weight Loss:  Appt start time: 1200 end time:  1215  SWL visit 3:  Primary concerns today: Mr. Ventrella is here for his 3rd SWL visit out of 6 in preparation for bariatric surgery. He has gained 1 pound since last visit. He is continuing to practice chewing thoroughly and not drinking while eating. He has not decided on a procedure and plans to continue to research.  Support group schedule provided.  Weight: 278.4  Plan:  -Work on chewing thoroughly and eating slowly -Attend a support group and continue doing research on procedures   MEDICATIONS: see list  DIETARY INTAKE:  24-hr recall:  B (AM): yogurt  Snk (AM) :  L (PM): Bojangles salad or chicken sandwich  Snk ( PM):  D (PM): salad with chicken or fish  Snk ( PM): bagel  Beverages: 4-5 bottles of water per day, decaf coffee and tea with artificial sweetener  Recent physical activity: none  Estimated energy needs: 1800-2000 calories 200-225 g carbohydrates  Progress Towards Goal(s):  In progress.   Nutritional Diagnosis:  [place]-3.3 Overweight/obesity related to past poor dietary habits and physical inactivity as evidenced by patient in SWL for pending bariatric surgery following dietary guidelines for continued weight loss.     Intervention:  Nutrition counseling provided.  Monitoring/Evaluation:  Dietary intake, exercise, and body weight in 4 week(s).

## 2014-07-03 ENCOUNTER — Encounter: Payer: Medicare Other | Attending: Surgery | Admitting: Dietician

## 2014-07-03 ENCOUNTER — Ambulatory Visit: Payer: Medicare Other | Admitting: Dietician

## 2014-07-03 ENCOUNTER — Encounter: Payer: Self-pay | Admitting: Dietician

## 2014-07-03 DIAGNOSIS — Z6841 Body Mass Index (BMI) 40.0 and over, adult: Secondary | ICD-10-CM | POA: Diagnosis not present

## 2014-07-03 DIAGNOSIS — Z713 Dietary counseling and surveillance: Secondary | ICD-10-CM | POA: Insufficient documentation

## 2014-07-03 NOTE — Patient Instructions (Signed)
-"  Complete" multivitamin, chewable or liquid but not gummy ([place] complete) -1500 mg Calcium citrate (not carbonate); 500 mg 3x a day

## 2014-07-03 NOTE — Progress Notes (Signed)
  Supervised Weight Loss:  Appt start time: 300 end time:  315  SWL visit 4:  Primary concerns today: Dennis Mitchell is here for his 4th SWL visit out of 6 in preparation for bariatric surgery. He has lost 2.5 lbs since last visit. He reports loose bowel movements after eating for the last week. He does not feel sick. He has been taking a "vitamin pack" from [facility]. He is still thinking about having the Lap Band over another procedure. He has not yet attended a support group meeting. He is still practicing chewing thoroughly and not drinking while eating. He is feeling excited and prepared for surgery.    Weight: 275.9 lbs  Plan:  -Work on chewing thoroughly and eating slowly -Attend a support group and continue doing research on procedures   MEDICATIONS: see list  DIETARY INTAKE:  24-hr recall:  B (AM): yogurt  Snk (AM) :  L (PM): Bojangles salad or chicken sandwich  Snk ( PM):  D (PM): salad with chicken or fish  Snk ( PM): bagel  Beverages: 4-5 bottles of water per day, decaf coffee and tea with artificial sweetener  Recent physical activity: none  Estimated energy needs: 1800-2000 calories 200-225 g carbohydrates  Progress Towards Goal(s):  In progress.   Nutritional Diagnosis:  [place]-3.3 Overweight/obesity related to past poor dietary habits and physical inactivity as evidenced by patient in SWL for pending bariatric surgery following dietary guidelines for continued weight loss.     Intervention:  Nutrition counseling provided.  Monitoring/Evaluation:  Dietary intake, exercise, and body weight in 4 week(s).

## 2014-07-18 ENCOUNTER — Other Ambulatory Visit: Payer: Self-pay | Admitting: Family Medicine

## 2014-07-31 ENCOUNTER — Other Ambulatory Visit: Payer: Self-pay | Admitting: Family Medicine

## 2014-08-02 ENCOUNTER — Encounter: Payer: Medicare Other | Attending: Surgery | Admitting: Dietician

## 2014-08-02 ENCOUNTER — Encounter: Payer: Self-pay | Admitting: Dietician

## 2014-08-02 DIAGNOSIS — Z6841 Body Mass Index (BMI) 40.0 and over, adult: Secondary | ICD-10-CM | POA: Insufficient documentation

## 2014-08-02 DIAGNOSIS — Z713 Dietary counseling and surveillance: Secondary | ICD-10-CM | POA: Insufficient documentation

## 2014-08-02 NOTE — Progress Notes (Signed)
  Supervised Weight Loss:  Appt start time: 1200 end time:  1215  SWL visit 5:  Primary concerns today: Dennis Mitchell is here for his 6th and final SWL visit out of 6 in preparation for bariatric surgery. He has gained 2 lbs since last visit. He states that he is having trouble with his insurance. He reports that he has been more conscious about not drinking while eating. Drinking decaf coffee, avoiding caffeine. Still leaning towards Lap Band. Feels like he already chews thoroughly.   Weight: 277.8 lbs  Plan:  -Work on chewing thoroughly and eating slowly -Attend a support group and continue doing research on procedures   MEDICATIONS: see list  DIETARY INTAKE:  24-hr recall:  B (AM): yogurt  Snk (AM) :  L (PM): Bojangles salad or chicken sandwich  Snk ( PM):  D (PM): salad with chicken or fish  Snk ( PM): bagel  Beverages: 4-5 bottles of water per day, decaf coffee and tea with artificial sweetener  Recent physical activity: none  Estimated energy needs: 1800-2000 calories 200-225 g carbohydrates  Progress Towards Goal(s):  In progress.   Nutritional Diagnosis:  [place]-3.3 Overweight/obesity related to past poor dietary habits and physical inactivity as evidenced by patient in SWL for pending bariatric surgery following dietary guidelines for continued weight loss.     Intervention:  Nutrition counseling provided.  Monitoring/Evaluation:  Dietary intake, exercise, and body weight in 4 week(s).

## 2014-08-07 ENCOUNTER — Other Ambulatory Visit: Payer: Self-pay | Admitting: Family Medicine

## 2014-08-07 LAB — HEPATIC FUNCTION PANEL
ALT: 16 U/L (ref 9–46)
AST: 15 U/L (ref 10–35)
Albumin: 4 g/dL (ref 3.6–5.1)
Alkaline Phosphatase: 58 U/L (ref 40–115)
BILIRUBIN DIRECT: 0.1 mg/dL (ref <=0.2)
BILIRUBIN INDIRECT: 0.4 mg/dL (ref 0.2–1.2)
BILIRUBIN TOTAL: 0.5 mg/dL (ref 0.2–1.2)
Total Protein: 6.7 g/dL (ref 6.1–8.1)

## 2014-08-07 LAB — LIPID PANEL
CHOL/HDL RATIO: 2.9 ratio (ref <=5.0)
Cholesterol: 169 mg/dL (ref 125–200)
HDL: 58 mg/dL (ref >=40)
LDL Cholesterol: 95 mg/dL (ref <130)
Triglycerides: 78 mg/dL (ref <150)
VLDL: 16 mg/dL (ref <30)

## 2014-08-07 LAB — BASIC METABOLIC PANEL
BUN: 14 mg/dL (ref 7–25)
CO2: 31 meq/L (ref 20–31)
Calcium: 9.2 mg/dL (ref 8.6–10.3)
Chloride: 98 mEq/L (ref 98–110)
Creat: 0.86 mg/dL (ref 0.70–1.25)
GLUCOSE: 145 mg/dL — AB (ref 65–99)
POTASSIUM: 4.3 meq/L (ref 3.5–5.3)
SODIUM: 137 meq/L (ref 135–146)

## 2014-08-08 LAB — PSA: PSA: 0.24 ng/mL (ref <=4.00)

## 2014-08-08 LAB — MICROALBUMIN, URINE: Microalb, Ur: 2.1 mg/dL — ABNORMAL HIGH (ref <2.0)

## 2014-08-15 ENCOUNTER — Ambulatory Visit (INDEPENDENT_AMBULATORY_CARE_PROVIDER_SITE_OTHER): Payer: Medicare Other | Admitting: Family Medicine

## 2014-08-15 ENCOUNTER — Encounter: Payer: Self-pay | Admitting: Family Medicine

## 2014-08-15 VITALS — BP 122/84 | Ht 67.0 in | Wt 276.0 lb

## 2014-08-15 DIAGNOSIS — I10 Essential (primary) hypertension: Secondary | ICD-10-CM

## 2014-08-15 DIAGNOSIS — F32A Depression, unspecified: Secondary | ICD-10-CM

## 2014-08-15 DIAGNOSIS — Z79891 Long term (current) use of opiate analgesic: Secondary | ICD-10-CM

## 2014-08-15 DIAGNOSIS — M549 Dorsalgia, unspecified: Secondary | ICD-10-CM

## 2014-08-15 DIAGNOSIS — I878 Other specified disorders of veins: Secondary | ICD-10-CM

## 2014-08-15 DIAGNOSIS — E785 Hyperlipidemia, unspecified: Secondary | ICD-10-CM

## 2014-08-15 DIAGNOSIS — F329 Major depressive disorder, single episode, unspecified: Secondary | ICD-10-CM | POA: Diagnosis not present

## 2014-08-15 DIAGNOSIS — E119 Type 2 diabetes mellitus without complications: Secondary | ICD-10-CM | POA: Diagnosis not present

## 2014-08-15 DIAGNOSIS — R5383 Other fatigue: Secondary | ICD-10-CM | POA: Diagnosis not present

## 2014-08-15 DIAGNOSIS — G8929 Other chronic pain: Secondary | ICD-10-CM

## 2014-08-15 LAB — POCT GLYCOSYLATED HEMOGLOBIN (HGB A1C): Hemoglobin A1C: 6.6

## 2014-08-15 MED ORDER — OXYCODONE-ACETAMINOPHEN 5-325 MG PO TABS: 1.0000 | ORAL_TABLET | Freq: Four times a day (QID) | ORAL | Status: DC | PRN

## 2014-08-15 MED ORDER — ESCITALOPRAM OXALATE 20 MG PO TABS: 20.0000 mg | ORAL_TABLET | Freq: Every day | ORAL | Status: DC

## 2014-08-15 NOTE — Progress Notes (Signed)
   Subjective:    Patient ID: Dennis Mitchell, male    DOB: January 27, 1950, 64 y.o.   MRN: [id number]  Diabetes He presents for his follow-up diabetic visit. He has type 2 diabetes mellitus. He is compliant with treatment all of the time. He is following a diabetic diet. He rarely participates in exercise. His breakfast blood glucose range is generally 90-110 mg/dl. He does not see a podiatrist.Eye exam is not current.  A1C today 6.6   Concerns about being tired and not having any interest in doing anything. Notes considerable fatigue at times. Does have a long-standing history of diabetes. No close family history of hypothyroidism.  Patient compliant with pain medication. States she definitely needs it to reach his potential. Claims no daytime side effects.  Compliant with blood pressure medication. No obvious side effects. Has cut down salt intake.  Not walking as much as he hopes  Feels current anti-depressive not doing as good as he used to do on it. lexapro discussed with patient. No suicidal or homicidal thoughts  Results for orders placed or performed in visit on 08/15/14  POCT glycosylated hemoglobin (Hb A1C)  Result Value Ref Range   Hemoglobin A1C 6.6      Review of Systems No headache no chest pain no back pain no abdominal pain no change in bowel habits no blood in stool    Objective:   Physical Exam Alert vitals stable substantial obesity present. HEENT normal. Lungs clear. Heart regular in rhythm. Ankles without edema feet and ankles positive venous stasis changes       Assessment & Plan:  Impression 1 type 2 diabetes good control #2 hypertension good control #3 depression suboptimum for chronic pain discussed at length including new requirements #4 chronic pain. Patient's pain overall controlled recent with medication. Long discussion held regarding the new requirements via DEA. #5 fatigue also discussed. Plan medications refilled. Check TSH. Stop Celexa. Start Lexapro.  Urine drug screen. New drug contract filled out. 35-40 minutes spent most in discussion WSL

## 2014-08-21 LAB — TOXASSURE SELECT 13 (MW), URINE: PDF: 0

## 2014-09-10 ENCOUNTER — Other Ambulatory Visit: Payer: Self-pay | Admitting: Family Medicine

## 2014-09-10 LAB — TSH: TSH: 2.019 u[IU]/mL (ref 0.350–4.500)

## 2014-09-20 ENCOUNTER — Ambulatory Visit (INDEPENDENT_AMBULATORY_CARE_PROVIDER_SITE_OTHER): Payer: No Typology Code available for payment source | Admitting: Psychiatry

## 2014-10-10 ENCOUNTER — Other Ambulatory Visit: Payer: Self-pay | Admitting: Family Medicine

## 2014-10-11 ENCOUNTER — Ambulatory Visit (INDEPENDENT_AMBULATORY_CARE_PROVIDER_SITE_OTHER): Payer: No Typology Code available for payment source | Admitting: Psychiatry

## 2014-10-15 ENCOUNTER — Telehealth: Payer: Self-pay | Admitting: *Deleted

## 2014-10-15 NOTE — Telephone Encounter (Signed)
Incoming fax from [employer] for lidocaine 5% ointment. Apply 2 grams up to qid prn. Pt last seen 08/15/14. Med is not on med list. Called pt to find out why he requested med. Pt states they called him and he told them he was not interested in med. Form faxed back denied.

## 2014-10-17 ENCOUNTER — Other Ambulatory Visit: Payer: Self-pay | Admitting: Family Medicine

## 2014-10-17 ENCOUNTER — Encounter: Payer: Self-pay | Admitting: Family Medicine

## 2014-10-17 ENCOUNTER — Ambulatory Visit (INDEPENDENT_AMBULATORY_CARE_PROVIDER_SITE_OTHER): Payer: Medicare Other | Admitting: Family Medicine

## 2014-10-17 VITALS — BP 132/86 | Ht 67.0 in | Wt 282.2 lb

## 2014-10-17 DIAGNOSIS — M549 Dorsalgia, unspecified: Secondary | ICD-10-CM

## 2014-10-17 DIAGNOSIS — Z0000 Encounter for general adult medical examination without abnormal findings: Secondary | ICD-10-CM

## 2014-10-17 DIAGNOSIS — G4733 Obstructive sleep apnea (adult) (pediatric): Secondary | ICD-10-CM | POA: Diagnosis not present

## 2014-10-17 DIAGNOSIS — Z23 Encounter for immunization: Secondary | ICD-10-CM | POA: Diagnosis not present

## 2014-10-17 DIAGNOSIS — G8929 Other chronic pain: Secondary | ICD-10-CM

## 2014-10-17 DIAGNOSIS — E119 Type 2 diabetes mellitus without complications: Secondary | ICD-10-CM | POA: Diagnosis not present

## 2014-10-17 MED ORDER — OXYCODONE-ACETAMINOPHEN 5-325 MG PO TABS: 1.0000 | ORAL_TABLET | Freq: Three times a day (TID) | ORAL | Status: DC | PRN

## 2014-10-17 NOTE — Progress Notes (Signed)
   Subjective:    Patient ID: Dennis Mitchell, male    DOB: 03/03/1950, 64 y.o.   MRN: [id number] Patient arrives office for a wellness exam along with other concerns. HPI The patient comes in today for a wellness visit.  Last colonoscopy 2015 advised that it looks good see results  A review of their health history was completed.  A review of medications was also completed.  Any needed refills; no  Eating habits: trying to  Falls/  MVA accidents in past few months: no  Regular exercise: rarely due to back pain  Specialist pt sees on regular basis: no  Preventative health issues were discussed.    Additional concerns: no  Ongoing challenges with pain. Back pain fairly severe. States 2 pain tablets a day just is not cutting it. Wonders if he can take more.  Due to have bariatric sleeve soon. Hopes this will help his chronic problems  Review of Systems  Constitutional: Negative for fever, activity change and appetite change.  HENT: Negative for congestion and rhinorrhea.   Eyes: Negative for discharge.  Respiratory: Negative for cough and wheezing.   Cardiovascular: Negative for chest pain.  Gastrointestinal: Negative for vomiting, abdominal pain and blood in stool.  Genitourinary: Negative for frequency and difficulty urinating.  Musculoskeletal: Negative for neck pain.  Skin: Negative for rash.  Allergic/Immunologic: Negative for environmental allergies and food allergies.  Neurological: Negative for weakness and headaches.  Psychiatric/Behavioral: Negative for agitation.  All other systems reviewed and are negative.      Objective:   Physical Exam  Constitutional: He appears well-developed and well-nourished.  Substantial obesity present  HENT:  Head: Normocephalic and atraumatic.  Right Ear: External ear normal.  Left Ear: External ear normal.  Nose: Nose normal.  Mouth/Throat: Oropharynx is clear and moist.  Eyes: EOM are normal. Pupils are equal, round, and  reactive to light.  Neck: Normal range of motion. Neck supple. No thyromegaly present.  Cardiovascular: Normal rate, regular rhythm and normal heart sounds.   No murmur heard. Pulmonary/Chest: Effort normal and breath sounds normal. No respiratory distress. He has no wheezes.  Abdominal: Soft. Bowel sounds are normal. He exhibits no distension and no mass. There is no tenderness.  Genitourinary: Penis normal.  Musculoskeletal: Normal range of motion. He exhibits no edema.  Lymphadenopathy:    He has no cervical adenopathy.  Neurological: He is alert. He exhibits normal muscle tone.  Skin: Skin is warm and dry. No erythema.  Chronic venous stasis changes of legs and ankles with trace to 1+ edema bilateral  Psychiatric: He has a normal mood and affect. His behavior is normal. Judgment normal.  Vitals reviewed.   Low back pain to percussion      Assessment & Plan:  Impression 1 wellness exam #2 chronic pain suboptimum discuss will increase medication #3 morbid obesity with intervention pending #4 type 2 diabetes discussed plan shingles vaccine Prevnar. Increase pain medication rationale discussed follow-up in several months. Maintain same dose of diabetes medicines. WSL

## 2014-10-22 ENCOUNTER — Encounter: Payer: Medicare Other | Attending: Surgery

## 2014-10-22 DIAGNOSIS — Z713 Dietary counseling and surveillance: Secondary | ICD-10-CM | POA: Insufficient documentation

## 2014-10-22 DIAGNOSIS — Z6841 Body Mass Index (BMI) 40.0 and over, adult: Secondary | ICD-10-CM | POA: Diagnosis not present

## 2014-10-23 NOTE — Progress Notes (Signed)
  Pre-Operative Nutrition Class:  Appt start time: 4944   End time:  1830.  Patient was seen on 10/22/2014 for Pre-Operative Bariatric Surgery Education at [facility].   Surgery date:  Surgery type: LAGB Start weight at [facility]: 271 lbs on 03/06/14 Weight today: 282 lbs  TANITA  BODY COMP RESULTS  10/22/14   BMI (kg/m^2) 45.5   Fat Mass (lbs) 120.5   Fat Free Mass (lbs) 161.5   Total Body Water (lbs) 118   Samples given per MNT protocol. Patient educated on appropriate usage: Celebrate calcium citrate chew (berry - qty 1) Lot #: [id number] Exp: 03/2016  Premier protein shake (chocolate - qty 1) Lot #: [id number] Exp: 04/2015  Unjury protein powder (chicken soup - qty 1) Lot #: [id number] Exp: 07/2015  PB2 (chocolate - qty 1) Lot #: [id number] Exp: 11/2014  The following the learning objectives were met by the patient during this course:  Identify Pre-Op Dietary Goals and will begin 2 weeks pre-operatively  Identify appropriate sources of fluids and proteins   State protein recommendations and appropriate sources pre and post-operatively  Identify Post-Operative Dietary Goals and will follow for 2 weeks post-operatively  Identify appropriate multivitamin and calcium sources  Describe the need for physical activity post-operatively and will follow MD recommendations  State when to call healthcare provider regarding medication questions or post-operative complications  Handouts given during class include:  Pre-Op Bariatric Surgery Diet Handout  Protein Shake Handout  Post-Op Bariatric Surgery Nutrition Handout  BELT Program Information Flyer  Support Group Information Flyer  [facility] Bariatric Supplements Price List  Follow-Up Plan: Patient will follow-up at [facility] 2 weeks post operatively for diet advancement per MD.

## 2014-10-24 ENCOUNTER — Ambulatory Visit (INDEPENDENT_AMBULATORY_CARE_PROVIDER_SITE_OTHER): Payer: Medicare Other | Admitting: Family Medicine

## 2014-10-24 ENCOUNTER — Encounter: Payer: Self-pay | Admitting: Family Medicine

## 2014-10-24 VITALS — BP 122/86 | Ht 67.0 in | Wt 283.2 lb

## 2014-10-24 DIAGNOSIS — I10 Essential (primary) hypertension: Secondary | ICD-10-CM

## 2014-10-24 DIAGNOSIS — Z01818 Encounter for other preprocedural examination: Secondary | ICD-10-CM

## 2014-10-24 NOTE — Progress Notes (Signed)
   Subjective:    Patient ID: Dennis Mitchell, male    DOB: [birth year], 64 y.o.   MRN: [id number]  HPI  Patient in for surgical clearance for bariatric sleeve. Patient states in today to get EKG done.   no chest pain no shortness of breath out of the ordinary. No nausea no diaphoresis  Patient states no other concerns this visit.  Review of Systems  no change in bowel habits no rash ROS otherwise negative    Objective:   Physical Exam   alert vital stable lungs clear heart rare rhythm H&T normal      Assessment & Plan:   impression hypertension stable EKG done preoperatively normal sinus rhythm nonspecific ST-T changes WSL

## 2014-11-05 ENCOUNTER — Other Ambulatory Visit: Payer: Self-pay | Admitting: Family Medicine

## 2014-11-14 ENCOUNTER — Ambulatory Visit: Payer: Medicare Other | Admitting: Family Medicine

## 2014-11-27 ENCOUNTER — Ambulatory Visit: Payer: Self-pay

## 2014-11-30 ENCOUNTER — Other Ambulatory Visit: Payer: Self-pay | Admitting: Family Medicine

## 2014-11-30 NOTE — Telephone Encounter (Signed)
Ok six mo worth 

## 2014-12-10 ENCOUNTER — Other Ambulatory Visit: Payer: Self-pay | Admitting: Family Medicine

## 2014-12-12 ENCOUNTER — Encounter: Payer: Self-pay | Admitting: Family Medicine

## 2015-01-02 ENCOUNTER — Other Ambulatory Visit: Payer: Self-pay | Admitting: Family Medicine

## 2015-01-16 ENCOUNTER — Ambulatory Visit: Payer: Medicare Other | Admitting: Family Medicine

## 2015-02-08 ENCOUNTER — Ambulatory Visit (INDEPENDENT_AMBULATORY_CARE_PROVIDER_SITE_OTHER): Payer: Commercial Managed Care - HMO | Admitting: Family Medicine

## 2015-02-08 ENCOUNTER — Other Ambulatory Visit: Payer: Self-pay | Admitting: Family Medicine

## 2015-02-08 ENCOUNTER — Encounter: Payer: Self-pay | Admitting: Family Medicine

## 2015-02-08 VITALS — BP 122/82 | Ht 67.0 in | Wt 286.1 lb

## 2015-02-08 DIAGNOSIS — E119 Type 2 diabetes mellitus without complications: Secondary | ICD-10-CM | POA: Diagnosis not present

## 2015-02-08 LAB — POCT GLYCOSYLATED HEMOGLOBIN (HGB A1C): Hemoglobin A1C: 6.9

## 2015-02-08 MED ORDER — OXYCODONE-ACETAMINOPHEN 5-325 MG PO TABS: 1.0000 | ORAL_TABLET | Freq: Three times a day (TID) | ORAL | Status: DC | PRN

## 2015-02-08 NOTE — Progress Notes (Signed)
   Subjective:    Patient ID: Dennis Mitchell, male    DOB: Feb 14, 1950, 65 y.o.   MRN: [id number]  Diabetes He presents for his follow-up diabetic visit. He has type 2 diabetes mellitus. No MedicAlert identification noted. He has had a previous visit with a dietitian. He does not see a podiatrist.Eye exam is not current.   no significant low sugar spells. Watching diabetic diet mostly Patient states no other concerns this visit.  Results for orders placed or performed in visit on 02/08/15  POCT HgB A1C  Result Value Ref Range   Hemoglobin A1C 6.9    . Back pain fairly severe affecting ability to do wha the want st o do  States Teflon needs pain medication. No obvious side effects. States can take it without compromising ability to drive interact etc.   Compliant with blood pressure medication. No obvious side effects. Watching salt intake. Does not miss meds meds reviewed today.  No sig low sugar spells,   Diffuse low back pain, knees also  Review of Systems  no headache no chest pain chronic back pain no change in bowel habits no blood in stool ROS otherwise negative    Objective:   Physical Exam   alert vitals stable. HEENT normal. Lungs  Clear. Heart regular in rhythm abdomen very large ankles positive venous stasis changes knees crepitations right greater than left low back tenderness to percussion feet sensation somewhat diminished distally      Assessment & Plan:   impression 1 type 2 diabetes control good discussed #2 hypertension controlled good maintain same meds discussed #3 chronic back pain ongoing medication use discussed #4 venous stasis ongoing plan pain medicine refilled. Diet exercise discussed. Diabetes medicines refilled. Encouraged to see eye doctor WSL

## 2015-02-15 ENCOUNTER — Other Ambulatory Visit: Payer: Self-pay | Admitting: Family Medicine

## 2015-02-21 ENCOUNTER — Telehealth: Payer: Self-pay | Admitting: Family Medicine

## 2015-02-21 DIAGNOSIS — D229 Melanocytic nevi, unspecified: Secondary | ICD-10-CM

## 2015-02-21 NOTE — Telephone Encounter (Signed)
Pt is needing a referral to Dr. Nevada Crane for mole removal. Pt switched ins and is now needing a referral. Pt is already established with Dr. Nevada Crane.

## 2015-02-21 NOTE — Telephone Encounter (Signed)
Ok lets 

## 2015-02-21 NOTE — Telephone Encounter (Signed)
Spoke with patient and informed him per Dr.Steve Luking- referral to Dr.Hall was put into epic. Patient verbalized understanding.

## 2015-02-28 ENCOUNTER — Encounter: Payer: Self-pay | Admitting: Family Medicine

## 2015-03-05 ENCOUNTER — Other Ambulatory Visit: Payer: Self-pay | Admitting: Family Medicine

## 2015-03-08 ENCOUNTER — Other Ambulatory Visit: Payer: Self-pay | Admitting: Surgery

## 2015-03-08 DIAGNOSIS — L570 Actinic keratosis: Secondary | ICD-10-CM | POA: Diagnosis not present

## 2015-03-08 DIAGNOSIS — Z6841 Body Mass Index (BMI) 40.0 and over, adult: Secondary | ICD-10-CM | POA: Diagnosis not present

## 2015-03-08 DIAGNOSIS — K429 Umbilical hernia without obstruction or gangrene: Secondary | ICD-10-CM | POA: Diagnosis not present

## 2015-03-08 DIAGNOSIS — L820 Inflamed seborrheic keratosis: Secondary | ICD-10-CM | POA: Diagnosis not present

## 2015-03-13 ENCOUNTER — Other Ambulatory Visit: Payer: Self-pay | Admitting: Family Medicine

## 2015-03-13 NOTE — Patient Instructions (Addendum)
YOUR PROCEDURE IS SCHEDULED ON :  03/19/15  REPORT TO Dennis Mitchell MAIN ENTRANCE FOLLOW SIGNS TO EAST ELEVATOR - GO TO 3rd FLOOR CHECK IN AT 3 EAST NURSES STATION (SHORT STAY) AT:  10:15 AM  CALL THIS NUMBER IF YOU HAVE PROBLEMS THE MORNING OF SURGERY [phone]  REMEMBER:ONLY 1 PER PERSON MAY GO TO SHORT STAY WITH YOU TO GET READY THE MORNING OF YOUR SURGERY  DO NOT EAT FOOD OR DRINK LIQUIDS AFTER MIDNIGHT  TAKE THESE MEDICINES THE MORNING OF SURGERY: AMLODIPINE / CELEXA / OXYCODONE  STOP ASPIRIN / IBUPROFEN / ALEVE / VITAMINS / HERBAL MEDS __5__ DAYS BEFORE SURGERY  BRING C-PAP TUBING AND MASK TO HOSPITAL  YOU MAY NOT HAVE ANY METAL ON YOUR BODY INCLUDING HAIR PINS AND PIERCING'S. DO NOT WEAR JEWELRY, MAKEUP, LOTIONS, POWDERS OR PERFUMES. DO NOT WEAR NAIL POLISH. DO NOT SHAVE 48 HRS PRIOR TO SURGERY. MEN MAY SHAVE FACE AND NECK.  DO NOT Dennis Mitchell. Dennis Mitchell IS NOT RESPONSIBLE FOR VALUABLES.  CONTACTS, DENTURES OR PARTIALS MAY NOT BE WORN TO SURGERY. LEAVE SUITCASE IN CAR. CAN BE BROUGHT TO ROOM AFTER SURGERY.  PATIENTS DISCHARGED THE DAY OF SURGERY WILL NOT BE ALLOWED TO DRIVE HOME.  PLEASE READ OVER THE FOLLOWING INSTRUCTION SHEETS _________________________________________________________________________________                                          Dennis Mitchell - PREPARING FOR SURGERY  Before surgery, you can play an important role.  Because skin is not sterile, your skin needs to be as free of germs as possible.  You can reduce the number of germs on your skin by washing with CHG (chlorahexidine gluconate) soap before surgery.  CHG is an antiseptic cleaner which kills germs and bonds with the skin to continue killing germs even after washing. Please DO NOT use if you have an allergy to CHG or antibacterial soaps.  If your skin becomes reddened/irritated stop using the CHG and inform your nurse when you arrive at Short Stay. Do not shave  (including legs and underarms) for at least 48 hours prior to the first CHG shower.  You may shave your face. Please follow these instructions carefully:   1.  Shower with CHG Soap the night before surgery and the  morning of Surgery.   2.  If you choose to wash your hair, wash your hair first as usual with your  normal  Shampoo.   3.  After you shampoo, rinse your hair and body thoroughly to remove the  shampoo.                                         4.  Use CHG as you would any other liquid soap.  You can apply chg directly  to the skin and wash . Gently wash with scrungie or clean wascloth    5.  Apply the CHG Soap to your body ONLY FROM THE NECK DOWN.   Do not use on open                           Wound or open sores. Avoid contact with eyes, ears mouth and genitals (private parts).  Genitals (private parts) with your normal soap.              6.  Wash thoroughly, paying special attention to the area where your surgery  will be performed.   7.  Thoroughly rinse your body with warm water from the neck down.   8.  DO NOT shower/wash with your normal soap after using and rinsing off  the CHG Soap .                9.  Pat yourself dry with a clean towel.             10.  Wear clean night clothes to bed after shower             11.  Place clean sheets on your bed the night of your first shower and do not  sleep with pets.  Day of Surgery : Do not apply any lotions/deodorants the morning of surgery.  Please wear clean clothes to the hospital/surgery center.  FAILURE TO FOLLOW THESE INSTRUCTIONS MAY RESULT IN THE CANCELLATION OF YOUR SURGERY    PATIENT SIGNATURE_________________________________  ______________________________________________________________________     Dennis Mitchell  An incentive spirometer is a tool that can help keep your lungs clear and active. This tool measures how well you are filling your lungs with each breath. Taking  long deep breaths may help reverse or decrease the chance of developing breathing (pulmonary) problems (especially infection) following:  A long period of time when you are unable to move or be active. BEFORE THE PROCEDURE   If the spirometer includes an indicator to show your best effort, your nurse or respiratory therapist will set it to a desired goal.  If possible, sit up straight or lean slightly forward. Try not to slouch.  Hold the incentive spirometer in an upright position. INSTRUCTIONS FOR USE  1. Sit on the edge of your bed if possible, or sit up as far as you can in bed or on a chair. 2. Hold the incentive spirometer in an upright position. 3. Breathe out normally. 4. Place the mouthpiece in your mouth and seal your lips tightly around it. 5. Breathe in slowly and as deeply as possible, raising the piston or the ball toward the top of the column. 6. Hold your breath for 3-5 seconds or for as long as possible. Allow the piston or ball to fall to the bottom of the column. 7. Remove the mouthpiece from your mouth and breathe out normally. 8. Rest for a few seconds and repeat Steps 1 through 7 at least 10 times every 1-2 hours when you are awake. Take your time and take a few normal breaths between deep breaths. 9. The spirometer may include an indicator to show your best effort. Use the indicator as a goal to work toward during each repetition. 10. After each set of 10 deep breaths, practice coughing to be sure your lungs are clear. If you have an incision (the cut made at the time of surgery), support your incision when coughing by placing a pillow or rolled up towels firmly against it. Once you are able to get out of bed, walk around indoors and cough well. You may stop using the incentive spirometer when instructed by your caregiver.  RISKS AND COMPLICATIONS  Take your time so you do not get dizzy or light-headed.  If you are in pain, you may need to take or ask for pain  medication before doing incentive spirometry. It  is harder to take a deep breath if you are having pain. AFTER USE  Rest and breathe slowly and easily.  It can be helpful to keep track of a log of your progress. Your caregiver can provide you with a simple table to help with this. If you are using the spirometer at home, follow these instructions: [place] IF:   You are having difficultly using the spirometer.  You have trouble using the spirometer as often as instructed.  Your pain medication is not giving enough relief while using the spirometer.  You develop fever of 100.5 F (38.1 C) or higher. SEEK IMMEDIATE MEDICAL CARE IF:   You cough up bloody sputum that had not been present before.  You develop fever of 102 F (38.9 C) or greater.  You develop worsening pain at or near the incision site. MAKE SURE YOU:   Understand these instructions.  Will watch your condition.  Will get help right away if you are not doing well or get worse. Document Released: 05/11/2006 Document Revised: 03/23/2011 Document Reviewed: 07/12/2006 [facility] Patient Information 2014 [place], [place].   ________________________________________________________________________

## 2015-03-14 ENCOUNTER — Encounter (HOSPITAL_COMMUNITY): Payer: Self-pay

## 2015-03-14 ENCOUNTER — Encounter (HOSPITAL_COMMUNITY)
Admission: RE | Admit: 2015-03-14 | Discharge: 2015-03-14 | Disposition: A | Discharge status: Home / Self Care | Payer: Commercial Managed Care - HMO | Source: Ambulatory Visit | Attending: Surgery | Admitting: Surgery

## 2015-03-14 DIAGNOSIS — Z01812 Encounter for preprocedural laboratory examination: Secondary | ICD-10-CM | POA: Diagnosis not present

## 2015-03-14 DIAGNOSIS — Z6841 Body Mass Index (BMI) 40.0 and over, adult: Secondary | ICD-10-CM | POA: Insufficient documentation

## 2015-03-14 DIAGNOSIS — K449 Diaphragmatic hernia without obstruction or gangrene: Secondary | ICD-10-CM | POA: Insufficient documentation

## 2015-03-14 HISTORY — DX: Dysphagia, unspecified: R13.10

## 2015-03-14 HISTORY — DX: Major depressive disorder, single episode, unspecified: F32.9

## 2015-03-14 HISTORY — DX: Other fatigue: R53.83

## 2015-03-14 HISTORY — DX: Depression, unspecified: F32.A

## 2015-03-14 HISTORY — DX: Personal history of other diseases of the digestive system: Z87.19

## 2015-03-14 LAB — CBC WITH DIFFERENTIAL/PLATELET
BASOS ABS: 0 10*3/uL (ref 0.0–0.1)
BASOS PCT: 0 %
EOS PCT: 1 %
Eosinophils Absolute: 0.1 10*3/uL (ref 0.0–0.7)
HCT: 41.6 % (ref 39.0–52.0)
Hemoglobin: 13.9 g/dL (ref 13.0–17.0)
LYMPHS PCT: 17 %
Lymphs Abs: 1.6 10*3/uL (ref 0.7–4.0)
MCH: 29 pg (ref 26.0–34.0)
MCHC: 33.4 g/dL (ref 30.0–36.0)
MCV: 86.7 fL (ref 78.0–100.0)
MONO ABS: 0.6 10*3/uL (ref 0.1–1.0)
Monocytes Relative: 6 %
Neutro Abs: 6.7 10*3/uL (ref 1.7–7.7)
Neutrophils Relative %: 76 %
PLATELETS: 245 10*3/uL (ref 150–400)
RBC: 4.8 MIL/uL (ref 4.22–5.81)
RDW: 13.6 % (ref 11.5–15.5)
WBC: 9 10*3/uL (ref 4.0–10.5)

## 2015-03-14 LAB — COMPREHENSIVE METABOLIC PANEL
ALT: 23 U/L (ref 17–63)
ANION GAP: 10 (ref 5–15)
AST: 23 U/L (ref 15–41)
Albumin: 4.1 g/dL (ref 3.5–5.0)
Alkaline Phosphatase: 55 U/L (ref 38–126)
BUN: 11 mg/dL (ref 6–20)
CHLORIDE: 95 mmol/L — AB (ref 101–111)
CO2: 31 mmol/L (ref 22–32)
Calcium: 9.7 mg/dL (ref 8.9–10.3)
Creatinine, Ser: 0.86 mg/dL (ref 0.61–1.24)
GFR calc Af Amer: >60 mL/min (ref >60)
Glucose, Bld: 98 mg/dL (ref 65–99)
POTASSIUM: 4.2 mmol/L (ref 3.5–5.1)
Sodium: 136 mmol/L (ref 135–145)
Total Bilirubin: 0.9 mg/dL (ref 0.3–1.2)
Total Protein: 7.8 g/dL (ref 6.5–8.1)

## 2015-03-14 NOTE — Progress Notes (Signed)
I spoke with Dennis Mitchell at [facility] office concerning bowel prep order. Pt was never given bowel prep instructions. She will have Dr,Newman review order and confirm that pt needs a bowel prep.

## 2015-03-15 LAB — HEMOGLOBIN A1C
HEMOGLOBIN A1C: 7.1 % — AB (ref 4.8–5.6)
Mean Plasma Glucose: 157 mg/dL

## 2015-03-18 NOTE — H&P (Signed)
Dennis Mitchell  Location: [facility] Patient #: [id number] DOB: 07/15/1950 Single / Language: Cleophus Molt / Race: White Male   History of Present Illness   The patient is a 65 year old male who presents for a bariatric surgery evaluation.   His PCP is Dr. Sallee Mitchell.  He comes by himself.   I saw Dennis Mitchell in January, 2016, to consider weight loss surgery. His initial weight is 268, BMI - 45.01. He gained some weight, now he has lost some weight. I saw him back in September 2016 to re-assess his plans. I reinforced to him again that weight loss before surgery is important. He has been on the pre op diet at least 2 or 3 times he said. At first he was thinking about a lap band, but he has switched to a sleeve. I think he will probably do better with this. He has signed the permit. His girlfriend could not get over to see him today.  He asked questions about the size of the stomach, how to get the stomach out, and the length of hospitalization. He also asked about whether the surgery would improve his sex drive - that depends on his weight loss and the meds he gets off. He is on the 2 week pre op diet. He sees Dennis Mitchell later today  [place] of abdomen on 03/07/2014 - negative UGI - 03/07/2014 - non specific esophageal motility disorder. Dennis Mitchell - 10/11/2014 - for psych  History of obesity: He has tried multiple diets throughout his adult life. He has tried Atkins, Nutrisystem's, and caloric watching. He'll lose about 10 or 15 pounds, then his weight was stalled, then he'll gained his weight back. He also tried diet pills prescribed by Dr. Mickie Mitchell but cannot remember the names.  Per the [place], the patient is a candidate for bariatric surgery. The patient attended our information session and reviewed the different types of bariatric surgery.  The patient is interested in the gastric sleeve. I discussed with  the patient the indications and risks of lap band surgery. The potential risks of surgery include, but are not limited to, bleeding, infection, DVT and PE, slippage and erosion of the band, open surgery, and death. The patient understands the importance of compliance and long term follow-up with our group after surgery.  Past Medical History: 1. HTN 2. OSA x 8 years On CPAP 3. Quit smoking around 2012 4. colonoscopy by dr. Laural Golden in 2015. 5. Right heel surgery In boot 6. DM, on oral hypoglycemics x 5 years 7. Chronic pain meds takes oxycodone about 2 to 3 times per week 8. Some problems with sex drive. He asked if the weight loss would help him. I could make no promises. 9. Dennis Mitchell - 10/11/2014 - for psych  Social History: Single. Has girlfriend, Dennis Mitchell, but she is not with him. He has two sons. 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   Other Problems Dennis Medal, MD; 03/08/2015 12:33 PM) Anxiety Disorder Back Pain Depression Hypercholesterolemia Sleep Apnea  Past Surgical History Dennis Medal, MD; 03/08/2015 12:33 PM) No pertinent past surgical history  Allergies Dennis Mitchell, CMA; 03/08/2015 12:03 PM) Aspirin *ANALGESICS - NonNarcotic* Penicillins Lisinopril *ANTIHYPERTENSIVES*  Medication History Dennis Mitchell, CMA; 03/08/2015 12:03 PM) OxyCODONE HCl (5MG /5ML Solution, 5-10 Milliliter Oral every four hours, as needed, Taken starting 03/08/2015) Active. Protonix (40MG  Tablet DR, 1 (one) Tablet Oral daily, Taken starting 03/08/2015)  Active. Zofran ODT (4MG  Tablet Disperse, 1 (one) Tablet Disperse Oral every six hours, as needed, Taken starting 03/08/2015) Active. AmLODIPine Besylate (5MG  Tablet, Oral) Active. Citalopram Hydrobromide (40MG  Tablet, Oral) Active. Diclofenac Sodium (75MG  Tablet DR, Oral) Active. GlipiZIDE (5MG  Tablet, Oral)  Active. Hydrochlorothiazide (25MG  Tablet, Oral) Active. MetFORMIN HCl (1000MG  Tablet, Oral) Active. Oxycodone-Acetaminophen (5-325MG  Tablet, Oral) Active. Klor-Con M20 [facility] Tablet ER, Oral) Active. Cyclobenzaprine Active. Epi E-Z Pen Jr (1:2000 Device, Injection) Active. LORazepam (2MG  Tablet, Oral) Active. Medications Reconciled  Vitals Dennis Mitchell CMA; 03/08/2015 12:04 PM) 03/08/2015 12:03 PM Weight: 278.6 lb Height: 65in Body Surface Area: 2.28 m Body Mass Index: 46.36 kg/m  Temp.: 97.19F(Temporal)  Pulse: 98 (Regular)  BP: 150/88 (Sitting, Left Arm, Standard)   Physical Exam  General: Obese WN WM alert and generally healthy appearing. HEENT: Normal. Pupils equal.  Neck: Supple. No mass. No thyroid mass. Lymph Nodes: No supraclavicular or cervical nodes.  Lungs: Clear to auscultation and symmetric breath sounds. Heart: RRR. No murmur or rub.  Abdomen: Soft. No mass. No tenderness. Normal bowel sounds. No abdominal scars. He is much more apple than pear. He has an umbilical hernia with about a 3.0 cm fascial defect.  Extremities: He has chronic venous stasis of both LE. He had a dog bite with a defect of the anterior aspect of his right LE.  Neurologic: Grossly intact to motor and sensory function. Psychiatric: Has normal mood and affect. Behavior is normal.   Assessment & Plan  1.  MORBID OBESITY WITH BMI OF 45.0-49.9, ADULT (E66.01)  Story: His initial weight is 245, BMI - 45.01.  He is scheduled for a sleeve gastrectomy on 03/19/2015  2.  UMBILICAL HERNIA WITHOUT OBSTRUCTION AND WITHOUT GANGRENE (K42.9)  Impression: He has literature on umbilical hernias.  3. HTN 4. OSA x 8 years  On CPAP 5. Quit smoking around 2012 6. Right heel surgery  In boot 7. DM, on oral hypoglycemics x 5 years 8. Chronic pain meds takes oxycodone about 2 to 3 times per week   Dennis Overall, MD, [facility]  Surgery Pager: [phone] Office phone:  [phone]

## 2015-03-19 ENCOUNTER — Encounter (HOSPITAL_COMMUNITY): Payer: Self-pay | Admitting: *Deleted

## 2015-03-19 ENCOUNTER — Inpatient Hospital Stay (HOSPITAL_COMMUNITY)
Admission: RE | Admit: 2015-03-19 | Discharge: 2015-03-21 | DRG: 620 | Disposition: A | Discharge status: Home / Self Care | Payer: Commercial Managed Care - HMO | Source: Ambulatory Visit | Attending: Surgery | Admitting: Surgery

## 2015-03-19 ENCOUNTER — Inpatient Hospital Stay (HOSPITAL_COMMUNITY): Payer: Commercial Managed Care - HMO | Admitting: Anesthesiology

## 2015-03-19 ENCOUNTER — Encounter (HOSPITAL_COMMUNITY)
Admission: RE | Disposition: A | Discharge status: Home / Self Care | Payer: Self-pay | Source: Ambulatory Visit | Attending: Surgery

## 2015-03-19 DIAGNOSIS — G4733 Obstructive sleep apnea (adult) (pediatric): Secondary | ICD-10-CM | POA: Diagnosis present

## 2015-03-19 DIAGNOSIS — Z7984 Long term (current) use of oral hypoglycemic drugs: Secondary | ICD-10-CM | POA: Diagnosis not present

## 2015-03-19 DIAGNOSIS — Z79891 Long term (current) use of opiate analgesic: Secondary | ICD-10-CM | POA: Diagnosis not present

## 2015-03-19 DIAGNOSIS — F419 Anxiety disorder, unspecified: Secondary | ICD-10-CM | POA: Diagnosis present

## 2015-03-19 DIAGNOSIS — K224 Dyskinesia of esophagus: Secondary | ICD-10-CM | POA: Diagnosis present

## 2015-03-19 DIAGNOSIS — I10 Essential (primary) hypertension: Secondary | ICD-10-CM | POA: Diagnosis present

## 2015-03-19 DIAGNOSIS — Z01812 Encounter for preprocedural laboratory examination: Secondary | ICD-10-CM | POA: Diagnosis not present

## 2015-03-19 DIAGNOSIS — G8929 Other chronic pain: Secondary | ICD-10-CM | POA: Diagnosis present

## 2015-03-19 DIAGNOSIS — Z880 Allergy status to penicillin: Secondary | ICD-10-CM | POA: Diagnosis not present

## 2015-03-19 DIAGNOSIS — K209 Esophagitis, unspecified: Secondary | ICD-10-CM | POA: Diagnosis present

## 2015-03-19 DIAGNOSIS — Z79899 Other long term (current) drug therapy: Secondary | ICD-10-CM

## 2015-03-19 DIAGNOSIS — K449 Diaphragmatic hernia without obstruction or gangrene: Secondary | ICD-10-CM | POA: Diagnosis present

## 2015-03-19 DIAGNOSIS — E119 Type 2 diabetes mellitus without complications: Secondary | ICD-10-CM | POA: Diagnosis present

## 2015-03-19 DIAGNOSIS — F329 Major depressive disorder, single episode, unspecified: Secondary | ICD-10-CM | POA: Diagnosis present

## 2015-03-19 DIAGNOSIS — Z886 Allergy status to analgesic agent status: Secondary | ICD-10-CM | POA: Diagnosis not present

## 2015-03-19 DIAGNOSIS — E7800 Pure hypercholesterolemia, unspecified: Secondary | ICD-10-CM | POA: Diagnosis present

## 2015-03-19 DIAGNOSIS — Z6841 Body Mass Index (BMI) 40.0 and over, adult: Secondary | ICD-10-CM

## 2015-03-19 DIAGNOSIS — Z87891 Personal history of nicotine dependence: Secondary | ICD-10-CM

## 2015-03-19 DIAGNOSIS — Z888 Allergy status to other drugs, medicaments and biological substances status: Secondary | ICD-10-CM | POA: Diagnosis not present

## 2015-03-19 DIAGNOSIS — K420 Umbilical hernia with obstruction, without gangrene: Secondary | ICD-10-CM | POA: Diagnosis present

## 2015-03-19 HISTORY — PX: LAPAROSCOPIC GASTRIC SLEEVE RESECTION WITH HIATAL HERNIA REPAIR: SHX6512

## 2015-03-19 HISTORY — PX: UPPER GI ENDOSCOPY: SHX6162

## 2015-03-19 LAB — HEMOGLOBIN AND HEMATOCRIT, BLOOD
HCT: 39.7 % (ref 39.0–52.0)
HEMOGLOBIN: 13.1 g/dL (ref 13.0–17.0)

## 2015-03-19 LAB — GLUCOSE, CAPILLARY
GLUCOSE-CAPILLARY: 158 mg/dL — AB (ref 65–99)
GLUCOSE-CAPILLARY: 135 mg/dL — AB (ref 65–99)
Glucose-Capillary: 112 mg/dL — ABNORMAL HIGH (ref 65–99)
Glucose-Capillary: 147 mg/dL — ABNORMAL HIGH (ref 65–99)

## 2015-03-19 SURGERY — GASTRECTOMY, SLEEVE, LAPAROSCOPIC, WITH HIATAL HERNIA REPAIR
Anesthesia: General | Site: Abdomen

## 2015-03-19 MED ORDER — FENTANYL CITRATE (PF) 250 MCG/5ML IJ SOLN
INTRAMUSCULAR | Status: AC
  Filled 2015-03-19: qty 5

## 2015-03-19 MED ORDER — ACETAMINOPHEN 10 MG/ML IV SOLN
1000.0000 mg | Freq: Once | INTRAVENOUS | Status: AC
  Administered 2015-03-19: 1000 mg via INTRAVENOUS

## 2015-03-19 MED ORDER — ACETAMINOPHEN 160 MG/5ML PO SOLN
325.0000 mg | ORAL | Status: DC | PRN
  Administered 2015-03-20: 650 mg via ORAL
  Filled 2015-03-19: qty 20.3

## 2015-03-19 MED ORDER — MORPHINE SULFATE (PF) 10 MG/ML IV SOLN
2.0000 mg | INTRAVENOUS | Status: DC | PRN
  Administered 2015-03-19: 2 mg via INTRAVENOUS
  Administered 2015-03-19 (×2): 4 mg via INTRAVENOUS
  Administered 2015-03-19: 6 mg via INTRAVENOUS
  Administered 2015-03-20 (×3): 4 mg via INTRAVENOUS
  Filled 2015-03-19 (×4): qty 1

## 2015-03-19 MED ORDER — PREMIER PROTEIN SHAKE
2.0000 [oz_av] | Freq: Four times a day (QID) | ORAL | Status: DC
  Administered 2015-03-21 (×2): 2 [oz_av] via ORAL

## 2015-03-19 MED ORDER — HYDROMORPHONE HCL 1 MG/ML IJ SOLN
INTRAMUSCULAR | Status: AC
  Filled 2015-03-19: qty 1

## 2015-03-19 MED ORDER — PHENYLEPHRINE 40 MCG/ML (10ML) SYRINGE FOR IV PUSH (FOR BLOOD PRESSURE SUPPORT)
PREFILLED_SYRINGE | INTRAVENOUS | Status: AC
  Filled 2015-03-19: qty 10

## 2015-03-19 MED ORDER — SUGAMMADEX SODIUM 500 MG/5ML IV SOLN
INTRAVENOUS | Status: DC | PRN
  Administered 2015-03-19: 250 mg via INTRAVENOUS

## 2015-03-19 MED ORDER — BUPIVACAINE-EPINEPHRINE 0.25% -1:200000 IJ SOLN
INTRAMUSCULAR | Status: AC
  Filled 2015-03-19: qty 1

## 2015-03-19 MED ORDER — HYDROMORPHONE HCL 1 MG/ML IJ SOLN
0.2500 mg | INTRAMUSCULAR | Status: DC | PRN
  Administered 2015-03-19 (×4): 0.5 mg via INTRAVENOUS

## 2015-03-19 MED ORDER — CHLORHEXIDINE GLUCONATE 4 % EX LIQD: 60.0000 mL | Freq: Once | CUTANEOUS | Status: DC

## 2015-03-19 MED ORDER — ONDANSETRON HCL 4 MG/2ML IJ SOLN
INTRAMUSCULAR | Status: DC | PRN
Start: 2015-03-19 — End: 2015-03-19
  Administered 2015-03-19: 4 mg via INTRAVENOUS

## 2015-03-19 MED ORDER — PROMETHAZINE HCL 25 MG/ML IJ SOLN
6.2500 mg | INTRAMUSCULAR | Status: DC | PRN
  Administered 2015-03-19: 12.5 mg via INTRAVENOUS

## 2015-03-19 MED ORDER — ONDANSETRON HCL 4 MG/2ML IJ SOLN: 4.0000 mg | INTRAMUSCULAR | Status: DC | PRN

## 2015-03-19 MED ORDER — EPHEDRINE SULFATE 50 MG/ML IJ SOLN
INTRAMUSCULAR | Status: AC
  Filled 2015-03-19: qty 1

## 2015-03-19 MED ORDER — ENOXAPARIN SODIUM 40 MG/0.4ML ~~LOC~~ SOLN
40.0000 mg | SUBCUTANEOUS | Status: AC
  Administered 2015-03-19: 40 mg via SUBCUTANEOUS
  Filled 2015-03-19: qty 0.4

## 2015-03-19 MED ORDER — SUGAMMADEX SODIUM 500 MG/5ML IV SOLN
INTRAVENOUS | Status: AC
  Filled 2015-03-19: qty 5

## 2015-03-19 MED ORDER — POTASSIUM CHLORIDE IN NACL 20-0.45 MEQ/L-% IV SOLN
INTRAVENOUS | Status: DC
  Administered 2015-03-19 – 2015-03-21 (×4): via INTRAVENOUS
  Filled 2015-03-19 (×7): qty 1000

## 2015-03-19 MED ORDER — INSULIN ASPART 100 UNIT/ML ~~LOC~~ SOLN
0.0000 [IU] | SUBCUTANEOUS | Status: DC
  Administered 2015-03-19 – 2015-03-21 (×6): 3 [IU] via SUBCUTANEOUS

## 2015-03-19 MED ORDER — ONDANSETRON HCL 4 MG/2ML IJ SOLN
INTRAMUSCULAR | Status: AC
  Filled 2015-03-19: qty 2

## 2015-03-19 MED ORDER — MIDAZOLAM HCL 5 MG/5ML IJ SOLN
INTRAMUSCULAR | Status: DC | PRN
  Administered 2015-03-19: 2 mg via INTRAVENOUS

## 2015-03-19 MED ORDER — ROCURONIUM BROMIDE 100 MG/10ML IV SOLN
INTRAVENOUS | Status: AC
  Filled 2015-03-19: qty 1

## 2015-03-19 MED ORDER — PROPOFOL 10 MG/ML IV BOLUS
INTRAVENOUS | Status: AC
  Filled 2015-03-19: qty 20

## 2015-03-19 MED ORDER — TISSEEL VH 10 ML EX KIT
PACK | CUTANEOUS | Status: DC | PRN
  Administered 2015-03-19 (×2): 1

## 2015-03-19 MED ORDER — HYDRALAZINE HCL 20 MG/ML IJ SOLN
10.0000 mg | Freq: Once | INTRAMUSCULAR | Status: AC
  Administered 2015-03-19: 10 mg via INTRAVENOUS

## 2015-03-19 MED ORDER — CEFOTETAN DISODIUM-DEXTROSE 2-2.08 GM-% IV SOLR
INTRAVENOUS | Status: AC
  Filled 2015-03-19: qty 50

## 2015-03-19 MED ORDER — BUPIVACAINE HCL (PF) 0.25 % IJ SOLN
INTRAMUSCULAR | Status: AC
  Filled 2015-03-19: qty 30

## 2015-03-19 MED ORDER — HYDRALAZINE HCL 20 MG/ML IJ SOLN
INTRAMUSCULAR | Status: AC
  Filled 2015-03-19: qty 1

## 2015-03-19 MED ORDER — MIDAZOLAM HCL 2 MG/2ML IJ SOLN
INTRAMUSCULAR | Status: AC
  Filled 2015-03-19: qty 2

## 2015-03-19 MED ORDER — MORPHINE SULFATE (PF) 10 MG/ML IV SOLN
INTRAVENOUS | Status: AC
  Filled 2015-03-19: qty 1

## 2015-03-19 MED ORDER — FENTANYL CITRATE (PF) 100 MCG/2ML IJ SOLN
INTRAMUSCULAR | Status: DC | PRN
  Administered 2015-03-19: 100 ug via INTRAVENOUS
  Administered 2015-03-19 (×3): 50 ug via INTRAVENOUS

## 2015-03-19 MED ORDER — SODIUM CHLORIDE 0.9 % IJ SOLN
INTRAMUSCULAR | Status: AC
  Filled 2015-03-19: qty 10

## 2015-03-19 MED ORDER — PROMETHAZINE HCL 25 MG/ML IJ SOLN
INTRAMUSCULAR | Status: AC
  Filled 2015-03-19: qty 1

## 2015-03-19 MED ORDER — OXYCODONE HCL 5 MG/5ML PO SOLN
5.0000 mg | ORAL | Status: DC | PRN
  Administered 2015-03-20 (×3): 10 mg via ORAL
  Filled 2015-03-19 (×4): qty 10

## 2015-03-19 MED ORDER — HYDROMORPHONE HCL 1 MG/ML IJ SOLN
INTRAMUSCULAR | Status: DC | PRN
  Administered 2015-03-19 (×2): 1 mg via INTRAVENOUS

## 2015-03-19 MED ORDER — LACTATED RINGERS IR SOLN
Status: DC | PRN
  Administered 2015-03-19: 1000 mL

## 2015-03-19 MED ORDER — TISSEEL VH 10 ML EX KIT
PACK | CUTANEOUS | Status: AC
  Filled 2015-03-19: qty 2

## 2015-03-19 MED ORDER — EPHEDRINE SULFATE 50 MG/ML IJ SOLN
INTRAMUSCULAR | Status: DC | PRN
  Administered 2015-03-19 (×2): 5 mg via INTRAVENOUS

## 2015-03-19 MED ORDER — LACTATED RINGERS IV SOLN
INTRAVENOUS | Status: DC | PRN
  Administered 2015-03-19 (×2): via INTRAVENOUS

## 2015-03-19 MED ORDER — ACETAMINOPHEN 160 MG/5ML PO SOLN
650.0000 mg | ORAL | Status: DC | PRN
Start: 2015-03-20 — End: 2015-03-21

## 2015-03-19 MED ORDER — PHENYLEPHRINE HCL 10 MG/ML IJ SOLN
INTRAMUSCULAR | Status: DC | PRN
  Administered 2015-03-19: 40 ug via INTRAVENOUS
  Administered 2015-03-19: 80 ug via INTRAVENOUS

## 2015-03-19 MED ORDER — PROPOFOL 10 MG/ML IV BOLUS
INTRAVENOUS | Status: DC | PRN
  Administered 2015-03-19: 200 mg via INTRAVENOUS

## 2015-03-19 MED ORDER — ACETAMINOPHEN 10 MG/ML IV SOLN
INTRAVENOUS | Status: AC
  Filled 2015-03-19: qty 100

## 2015-03-19 MED ORDER — ENOXAPARIN SODIUM 30 MG/0.3ML ~~LOC~~ SOLN
30.0000 mg | Freq: Two times a day (BID) | SUBCUTANEOUS | Status: DC
  Administered 2015-03-20 – 2015-03-21 (×2): 30 mg via SUBCUTANEOUS
  Filled 2015-03-19 (×4): qty 0.3

## 2015-03-19 MED ORDER — SUCCINYLCHOLINE CHLORIDE 20 MG/ML IJ SOLN
INTRAMUSCULAR | Status: DC | PRN
  Administered 2015-03-19: 140 mg via INTRAVENOUS

## 2015-03-19 MED ORDER — BUPIVACAINE HCL 0.25 % IJ SOLN
INTRAMUSCULAR | Status: DC | PRN
  Administered 2015-03-19: 5 mL

## 2015-03-19 MED ORDER — LIDOCAINE HCL (PF) 2 % IJ SOLN
INTRAMUSCULAR | Status: DC | PRN
  Administered 2015-03-19: 100 mg via INTRADERMAL

## 2015-03-19 MED ORDER — CEFOTETAN DISODIUM-DEXTROSE 2-2.08 GM-% IV SOLR
2.0000 g | INTRAVENOUS | Status: AC
  Administered 2015-03-19: 2 g via INTRAVENOUS

## 2015-03-19 MED ORDER — LACTATED RINGERS IV SOLN
INTRAVENOUS | Status: DC
  Administered 2015-03-19: 19:00:00 via INTRAVENOUS

## 2015-03-19 MED ORDER — ROCURONIUM BROMIDE 100 MG/10ML IV SOLN
INTRAVENOUS | Status: DC | PRN
  Administered 2015-03-19: 10 mg via INTRAVENOUS
  Administered 2015-03-19: 50 mg via INTRAVENOUS

## 2015-03-19 MED ORDER — HYDROMORPHONE HCL 2 MG/ML IJ SOLN
INTRAMUSCULAR | Status: AC
  Filled 2015-03-19: qty 1

## 2015-03-19 SURGICAL SUPPLY — 63 items
APPLICATOR COTTON TIP 6IN STRL (MISCELLANEOUS) IMPLANT
APPLIER CLIP ROT 10 11.4 M/L (STAPLE)
APPLIER CLIP ROT 13.4 12 LRG (CLIP)
BLADE SURG 15 STRL LF DISP TIS (BLADE) ×2 IMPLANT
BLADE SURG 15 STRL SS (BLADE) ×2
CABLE HIGH FREQUENCY MONO STRZ (ELECTRODE) ×4 IMPLANT
CHLORAPREP W/TINT 26ML (MISCELLANEOUS) ×4 IMPLANT
CLIP APPLIE ROT 10 11.4 M/L (STAPLE) IMPLANT
CLIP APPLIE ROT 13.4 12 LRG (CLIP) IMPLANT
COVER SURGICAL LIGHT HANDLE (MISCELLANEOUS) ×4 IMPLANT
DEVICE SUT QUICK LOAD TK 5 (STAPLE) ×3 IMPLANT
DEVICE SUT TI-KNOT TK 5X26 (MISCELLANEOUS) ×3 IMPLANT
DEVICE SUTURE ENDOST 10MM (ENDOMECHANICALS) ×4 IMPLANT
DEVICE TI KNOT TK5 (MISCELLANEOUS) ×1
DEVICE TROCAR PUNCTURE CLOSURE (ENDOMECHANICALS) ×4 IMPLANT
DISSECTOR BLUNT TIP ENDO 5MM (MISCELLANEOUS) IMPLANT
DRAPE UTILITY XL STRL (DRAPES) ×8 IMPLANT
ELECT REM PT RETURN 9FT ADLT (ELECTROSURGICAL) ×4
ELECTRODE REM PT RTRN 9FT ADLT (ELECTROSURGICAL) ×2 IMPLANT
GAUZE SPONGE 4X4 12PLY STRL (GAUZE/BANDAGES/DRESSINGS) IMPLANT
GLOVE SURG SIGNA 7.5 PF LTX (GLOVE) ×4 IMPLANT
GOWN STRL REUS W/TWL XL LVL3 (GOWN DISPOSABLE) ×16 IMPLANT
HOVERMATT SINGLE USE (MISCELLANEOUS) ×4 IMPLANT
KIT BASIN OR (CUSTOM PROCEDURE TRAY) ×4 IMPLANT
LIQUID BAND (GAUZE/BANDAGES/DRESSINGS) ×4 IMPLANT
MARKER SKIN DUAL TIP RULER LAB (MISCELLANEOUS) ×4 IMPLANT
NEEDLE SPNL 22GX3.5 QUINCKE BK (NEEDLE) ×4 IMPLANT
PACK UNIVERSAL I (CUSTOM PROCEDURE TRAY) ×4 IMPLANT
QUICK LOAD TK 5 (STAPLE) ×1
RELOAD STAPLER BLUE 60MM (STAPLE) ×4 IMPLANT
RELOAD STAPLER GOLD 60MM (STAPLE) ×4 IMPLANT
RELOAD STAPLER GREEN 60MM (STAPLE) ×4 IMPLANT
SCISSORS LAP 5X35 DISP (ENDOMECHANICALS) ×4 IMPLANT
SEALANT SURGICAL APPL DUAL CAN (MISCELLANEOUS) ×4 IMPLANT
SET IRRIG TUBING LAPAROSCOPIC (IRRIGATION / IRRIGATOR) ×4 IMPLANT
SHEARS HARMONIC ACE PLUS 45CM (MISCELLANEOUS) ×4 IMPLANT
SLEEVE ADV FIXATION 5X100MM (TROCAR) ×4 IMPLANT
SLEEVE GASTRECTOMY 36FR VISIGI (MISCELLANEOUS) ×4 IMPLANT
SOLUTION ANTI FOG 6CC (MISCELLANEOUS) ×4 IMPLANT
SPONGE LAP 18X18 X RAY DECT (DISPOSABLE) ×4 IMPLANT
STAPLER ECHELON LONG 60 440 (INSTRUMENTS) ×4 IMPLANT
STAPLER RELOAD BLUE 60MM (STAPLE) ×8
STAPLER RELOAD GOLD 60MM (STAPLE) ×8
STAPLER RELOAD GREEN 60MM (STAPLE) ×8
SUT MNCRL AB 4-0 PS2 18 (SUTURE) ×4 IMPLANT
SUT SURGIDAC NAB ES-9 0 48 120 (SUTURE) ×4 IMPLANT
SUT VICRYL 0 TIES 12 18 (SUTURE) ×4 IMPLANT
SYR 10ML ECCENTRIC (SYRINGE) ×4 IMPLANT
SYR 20CC LL (SYRINGE) ×4 IMPLANT
TOWEL OR 17X26 10 PK STRL BLUE (TOWEL DISPOSABLE) ×4 IMPLANT
TOWEL OR NON WOVEN STRL DISP B (DISPOSABLE) ×4 IMPLANT
TRAY FOLEY W/METER SILVER 14FR (SET/KITS/TRAYS/PACK) IMPLANT
TRAY FOLEY W/METER SILVER 16FR (SET/KITS/TRAYS/PACK) IMPLANT
TROCAR ADV FIXATION 12X100MM (TROCAR) ×4 IMPLANT
TROCAR ADV FIXATION 5X100MM (TROCAR) ×4 IMPLANT
TROCAR BLADELESS 15MM (ENDOMECHANICALS) ×4 IMPLANT
TROCAR BLADELESS OPT 5 100 (ENDOMECHANICALS) ×4 IMPLANT
TROCAR XCEL 12X100 BLDLESS (ENDOMECHANICALS) IMPLANT
TUBE CALIBRATION LAPBAND (TUBING) ×4 IMPLANT
TUBING CONNECTING 10 (TUBING) ×6 IMPLANT
TUBING CONNECTING 10' (TUBING) ×2
TUBING ENDO SMARTCAP PENTAX (MISCELLANEOUS) ×4 IMPLANT
TUBING INSUF HEATED (TUBING) ×4 IMPLANT

## 2015-03-19 NOTE — Op Note (Addendum)
PATIENT:   Dennis Mitchell DOB:   1950/12/03 MRN:   [id number]  DATE OF PROCEDURE: 03/19/2015                   FACILITY:  [facility]  OPERATIVE REPORT  PREOPERATIVE DIAGNOSIS:  Morbid obesity, umbilical hernia  POSTOPERATIVE DIAGNOSIS:  Morbid obesity (weight 268, BMI of 46.4), small hiatal hernia, incarcerated umbilical hernia (with omentum)  PROCEDURE:  Laparoscopic Sleeve gastrectomy (intraoperative upper endoscopy by Dr. Redmond Pulling), hiatal hernia repair with posterior crural repair  (photos in chart)  SURGEON:  Fenton Malling. Lucia Gaskins, MD  FIRST ASSISTANTOk Anis, MD  ANESTHESIA:  General endotracheal.  Anesthesiologist: Myrtie Soman, MD CRNA: Lollie Sails, CRNA; Anne Fu, CRNA  General  ESTIMATED BLOOD LOSS:  Minimal.  LOCAL ANESTHESIA:  30 cc of 0000000 Marcaine  COMPLICATIONS:  None.  INDICATION FOR SURGERY:  BIGE BARRIENTOS is a 65 y.o. white  male who sees Mickie Hillier, MD as her primary care doctor.  She has completed our preoperative bariatric program and now comes for a laparoscopic Sleeve gastrectomy.  The indications, potential complications of surgery were explained to the patient.  Potential complications of the surgery include, but are not limited to, bleeding, infection, DVT, open surgery, and long-term nutritional consequences.  He also has a small hiatal hernia seen on UGI.  We will check this during the operation.  I discussed repair of the hiatal hernia at the time of surgery, if necessary.  There has also been a discussion about his allergies - which is unclear.  The chart states that he has an anaphylactic reaction to pork, but the patient is eating bacon.  OPERATIVE NOTE:  The patient taken to room #1 at [facility] where Mr. Endo underwent a general endotracheal anesthetic, supervised by Anesthesiologist: Myrtie Soman, MD CRNA: Lollie Sails, CRNA; Anne Fu, CRNA.  The patient was given 2 g of cefoxitin at the beginning of the procedure.  A time-out was  held and surgical checklist run.  I accessed her abdominal cavity through the left upper quadrant with a 5 mm Optiview. I did an abdominal exploration.   His bowel were unremarkable. The right and left lobes of the liver unremarkable. Gallbladder was normal. Her stomach was unremarkable.  He does have omentum stuck in a 3+ cm umbilical hernia.  I took pictures of this and put it in the chart.  The plan is to repair the abdominal wall hernia when he has lost weight.  I placed a total of 6 trocars. I placed a 5 mm left lateral trocar, a 5 mm left paramedian trocar (for the scope), a 12 mm right paramedian torcar, a 5 mm right subcostal trocar that I converted to a 15 mm to extract the stomach and 5 mm subxiphoid trocar for the liver retractor.  I first evaluated the hiatal hernia.  We passed the sizinig balloon and added 15 cc.  The balloon held up at the hiatus. I then reduced it to 10 cc in the balloon and this time, the balloon was pulled through the hiatus.  I exposed the right and left crura posteriorly.  I placed a single 0 Ethibond suture through both crura which I secured with a Tye Knot.  I then retested the hiatus with 10 cc of air in the balloon and the balloon hung up at the hiatus.  I started out taking down the greater curvature attachments of the stomach. I measured approximately 6 cm proximal from  the pylorus and mobilized the greater curvature of the stomach with the Harmonic Scalpel. I took this dissection cranially around the greater curvature of her stomach to the angle of His and the left crus.   After I had mobilized the greater curvature of the stomach, I then passed the 36 French ViSiGi bougie which was used to suck the stomach up against the lesser curvature and placed into the antrum. During the staple firing,  I tried to give the [place] a cuff at least about 1 cm. I tried to avoid narrowing the incisura. I used a total of 7 staple firings.  From antrum to the angle of His, I used  2 green, 2 gold and 3 blue Eschelon 60 mm Ethicon staplers.  I had to clean off the fat pad off the proximal stomach.  I did not use staple line reinforcement.   At each firing of the EndoGIA stapler, I inspected the stomach, anterior wall of the stomach, and underneath to make sure there was no compromise or impingement on to the ViSiGi bougie.   The staple line seemed linear without any corkscrewing of the stomach. Hemostasis was good. I did not use any reinforcement. She did have at least 2 areas of bleeding which I used clips to clip on the new greater curvature of the stomach.  Because I thought we had a good staple line, I then had the ViSiGi was converted to insufflate the pouch. A new stomach pouch was placed under water. There was no bubbling or leak noted.   At this point, Dr. Redmond Pulling broke scrub and passed an upper endoscope down through the esophagus into the stomach pouch. The stomach was tubular. There was no narrowing of the stomach pouch or angulation. We were easily able to pass the endoscope into the antrum and again put air pressure on the staple line. I irrigated the upper abdomen with saline. There was no bubbling or evidence of air leak. The mucosa looked viable. Dr. Redmond Pulling decompressed the stomach with the endoscopy.   I converted to right subcostal trocar to a 15 trocar and extracted the stomach remnant through this intact and sent this to Pathology. I then placed 10 cc of Tisseel along the new greater curvature staple line and covered the entire staple line with the Tisseel.  I aspirated out the saline that I had irrigated because I thought the staple line looked viable and complete. There was no evidence of leak. I did not leave a drain in place.   Then, I closed the trocar sites. I placed 0 Vicryl sutures at the 15-mm port site in the right upper quadrant. The other port sites seemed smaller not requiring sutures. I closed the skin at each site with a 4-0 Monocryl, painted each  wound with LiquiBand.   The patient was transported to recovery room in good condition. Sponge and needle count were correct at the end of the case.    Alphonsa Overall, MD, [facility] Surgery Pager: [phone] Office phone:  [phone]

## 2015-03-19 NOTE — Anesthesia Postprocedure Evaluation (Signed)
Anesthesia Post Note  Patient: Dennis Mitchell  Procedure(s) Performed: Procedure(s) (LRB): LAPAROSCOPIC GASTRIC SLEEVE RESECTION WITH  HIATAL HERNIA REPAIR (N/A) UPPER GI ENDOSCOPY  Patient location during evaluation: PACU Anesthesia Type: General Level of consciousness: awake and alert Pain management: pain level controlled Vital Signs Assessment: post-procedure vital signs reviewed and stable Respiratory status: spontaneous breathing, nonlabored ventilation, respiratory function stable and patient connected to nasal cannula oxygen Cardiovascular status: blood pressure returned to baseline and stable Postop Assessment: no signs of nausea or vomiting Anesthetic complications: no    Last Vitals:  Filed Vitals:   03/19/15 1630 03/19/15 1645  BP:  164/87  Pulse:    Temp: 36.9 C   Resp:      Last Pain:  Filed Vitals:   03/19/15 1650  PainSc: 3                  Ciella Obi S

## 2015-03-19 NOTE — Anesthesia Procedure Notes (Signed)
Procedure Name: Intubation Date/Time: 03/19/2015 12:14 PM Performed by: Lollie Sails Pre-anesthesia Checklist: Patient identified, Emergency Drugs available, Suction available, Patient being monitored and Timeout performed Patient Re-evaluated:Patient Re-evaluated prior to inductionOxygen Delivery Method: Circle system utilized Preoxygenation: Pre-oxygenation with 100% oxygen Intubation Type: IV induction Ventilation: Mask ventilation without difficulty Laryngoscope Size: Miller and 3 Grade View: Grade II Tube type: Oral Tube size: 7.5 mm Number of attempts: 1 Airway Equipment and Method: Bougie stylet Placement Confirmation: ETT inserted through vocal cords under direct vision,  positive ETCO2 and breath sounds checked- equal and bilateral Secured at: 23 cm Tube secured with: Tape Dental Injury: Teeth and Oropharynx as per pre-operative assessment  Comments: Able to ventilate with mask.   DL revealed grade 2 view - endotube not passing smoothly - did not want to cause trauma secondary to trach history - bougie passed easily with endotracheal tube advancing smoothly and easily over it.

## 2015-03-19 NOTE — Op Note (Signed)
Dennis Mitchell [id number] 03-29-1950 03/19/2015  Preoperative diagnosis: morbid obesity  Postoperative diagnosis: Same, distal esophageal esophagitis  Procedure: upper endoscopy   Surgeon: Leighton Ruff. Eliane Hammersmith M.D., FACS   Anesthesia: Gen.   Indications for procedure: 64 y.o. year old male undergoing Laparoscopic Gastric Sleeve Resection and an EGD was requested to evaluate the new gastric sleeve.   Description of procedure: After we have completed the sleeve resection, I scrubbed out and obtained the Olympus endoscope. I gently placed endoscope in the patient's oropharynx and gently glided it down the esophagus without any difficulty under direct visualization. Once I was in the gastric sleeve, I insufflated the stomach with air. I was able to cannulate and advanced the scope through the gastric sleeve. I was able to cannulate the duodenum with ease. Dr. Lucia Gaskins had placed saline in the upper abdomen. Upon further insufflation of the gastric sleeve there was no evidence of bubbles. GE junction located at 40 cm.  There was some evidence of mild distal esophagitis. Upon further inspection of the gastric sleeve, the mucosa appeared normal. There is no evidence of any mucosal abnormality. The sleeve was widely patent at the angularis. There was no evidence of bleeding. The gastric sleeve was decompressed. The scope was withdrawn. The patient tolerated this portion of the procedure well. Please see Dr Pollie Friar operative note for details regarding the laparoscopic gastric sleeve resection.   Leighton Ruff. Redmond Pulling, MD, FACS  General, Bariatric, & Minimally Invasive Surgery  [facility], [place]

## 2015-03-19 NOTE — Interval H&P Note (Signed)
History and Physical Interval Note:  03/19/2015 11:45 AM  Dennis Mitchell  has presented today for surgery, with the diagnosis of Morbid Obesity, Possible Hiatal Hernia  The various methods of treatment have been discussed with the patient and family.   Girlfriend, Mariann Laster, here with patient.  After consideration of risks, benefits and other options for treatment, the patient has consented to  Procedure(s): LAPAROSCOPIC GASTRIC SLEEVE RESECTION WITH POSSIBLE HIATAL HERNIA REPAIR (N/A) as a surgical intervention .  The patient's history has been reviewed, patient examined, no change in status, stable for surgery.  I have reviewed the patient's chart and labs.  Questions were answered to the patient's satisfaction.     [employer]

## 2015-03-19 NOTE — Transfer of Care (Signed)
Immediate Anesthesia Transfer of Care Note  Patient: Dennis Mitchell  Procedure(s) Performed: Procedure(s): LAPAROSCOPIC GASTRIC SLEEVE RESECTION WITH  HIATAL HERNIA REPAIR (N/A) UPPER GI ENDOSCOPY  Patient Location: PACU  Anesthesia Type:General  Level of Consciousness:  sedated, patient cooperative and responds to stimulation  Airway & Oxygen Therapy:Patient Spontanous Breathing and Patient connected to face mask oxgen  Post-op Assessment:  Report given to PACU RN and Post -op Vital signs reviewed and stable  Post vital signs:  Reviewed and stable  Last Vitals:  Filed Vitals:   03/19/15 1026  BP: 165/91  Pulse: 82  Temp: 36.5 C  Resp: 18    Complications: No apparent anesthesia complications

## 2015-03-19 NOTE — Anesthesia Preprocedure Evaluation (Signed)
Anesthesia Evaluation  Patient identified by MRN, date of birth, ID band Patient awake    Reviewed: Allergy & Precautions, NPO status , Patient's Chart, lab work & pertinent test results  Airway Mallampati: II  TM Distance: <3 FB Neck ROM: Full    Dental no notable dental hx.    Pulmonary sleep apnea , former smoker,    Pulmonary exam normal breath sounds clear to auscultation       Cardiovascular hypertension, Pt. on medications Normal cardiovascular exam Rhythm:Regular Rate:Normal     Neuro/Psych negative neurological ROS  negative psych ROS   GI/Hepatic negative GI ROS, Neg liver ROS,   Endo/Other  diabetes  Renal/GU negative Renal ROS  negative genitourinary   Musculoskeletal negative musculoskeletal ROS (+)   Abdominal   Peds negative pediatric ROS (+)  Hematology negative hematology ROS (+)   Anesthesia Other Findings   Reproductive/Obstetrics negative OB ROS                             Anesthesia Physical Anesthesia Plan  ASA: III  Anesthesia Plan: General   Post-op Pain Management:    Induction: Intravenous  Airway Management Planned: Oral ETT  Additional Equipment:   Intra-op Plan:   Post-operative Plan: Extubation in OR  Informed Consent: I have reviewed the patients History and Physical, chart, labs and discussed the procedure including the risks, benefits and alternatives for the proposed anesthesia with the patient or authorized representative who has indicated his/her understanding and acceptance.   Dental advisory given  Plan Discussed with: CRNA and Surgeon  Anesthesia Plan Comments:         Anesthesia Quick Evaluation

## 2015-03-20 ENCOUNTER — Encounter (HOSPITAL_COMMUNITY): Payer: Self-pay | Admitting: Surgery

## 2015-03-20 DIAGNOSIS — Z880 Allergy status to penicillin: Secondary | ICD-10-CM | POA: Diagnosis not present

## 2015-03-20 DIAGNOSIS — Z01812 Encounter for preprocedural laboratory examination: Secondary | ICD-10-CM | POA: Diagnosis not present

## 2015-03-20 DIAGNOSIS — F329 Major depressive disorder, single episode, unspecified: Secondary | ICD-10-CM | POA: Diagnosis present

## 2015-03-20 DIAGNOSIS — E119 Type 2 diabetes mellitus without complications: Secondary | ICD-10-CM | POA: Diagnosis present

## 2015-03-20 DIAGNOSIS — K420 Umbilical hernia with obstruction, without gangrene: Secondary | ICD-10-CM | POA: Diagnosis present

## 2015-03-20 DIAGNOSIS — Z6841 Body Mass Index (BMI) 40.0 and over, adult: Secondary | ICD-10-CM | POA: Diagnosis not present

## 2015-03-20 DIAGNOSIS — G8929 Other chronic pain: Secondary | ICD-10-CM | POA: Diagnosis present

## 2015-03-20 DIAGNOSIS — G4733 Obstructive sleep apnea (adult) (pediatric): Secondary | ICD-10-CM | POA: Diagnosis present

## 2015-03-20 DIAGNOSIS — Z79899 Other long term (current) drug therapy: Secondary | ICD-10-CM | POA: Diagnosis not present

## 2015-03-20 DIAGNOSIS — Z888 Allergy status to other drugs, medicaments and biological substances status: Secondary | ICD-10-CM | POA: Diagnosis not present

## 2015-03-20 DIAGNOSIS — I10 Essential (primary) hypertension: Secondary | ICD-10-CM | POA: Diagnosis present

## 2015-03-20 DIAGNOSIS — K209 Esophagitis, unspecified: Secondary | ICD-10-CM | POA: Diagnosis present

## 2015-03-20 DIAGNOSIS — E7800 Pure hypercholesterolemia, unspecified: Secondary | ICD-10-CM | POA: Diagnosis present

## 2015-03-20 DIAGNOSIS — Z79891 Long term (current) use of opiate analgesic: Secondary | ICD-10-CM | POA: Diagnosis not present

## 2015-03-20 DIAGNOSIS — Z886 Allergy status to analgesic agent status: Secondary | ICD-10-CM | POA: Diagnosis not present

## 2015-03-20 DIAGNOSIS — K224 Dyskinesia of esophagus: Secondary | ICD-10-CM | POA: Diagnosis present

## 2015-03-20 DIAGNOSIS — Z7984 Long term (current) use of oral hypoglycemic drugs: Secondary | ICD-10-CM | POA: Diagnosis not present

## 2015-03-20 DIAGNOSIS — F419 Anxiety disorder, unspecified: Secondary | ICD-10-CM | POA: Diagnosis present

## 2015-03-20 DIAGNOSIS — Z87891 Personal history of nicotine dependence: Secondary | ICD-10-CM | POA: Diagnosis not present

## 2015-03-20 DIAGNOSIS — K449 Diaphragmatic hernia without obstruction or gangrene: Secondary | ICD-10-CM | POA: Diagnosis present

## 2015-03-20 LAB — CBC WITH DIFFERENTIAL/PLATELET
BASOS ABS: 0 10*3/uL (ref 0.0–0.1)
Basophils Relative: 0 %
EOS PCT: 0 %
Eosinophils Absolute: 0 10*3/uL (ref 0.0–0.7)
HEMATOCRIT: 38.1 % — AB (ref 39.0–52.0)
HEMOGLOBIN: 12.7 g/dL — AB (ref 13.0–17.0)
LYMPHS PCT: 10 %
Lymphs Abs: 1.1 10*3/uL (ref 0.7–4.0)
MCH: 29.1 pg (ref 26.0–34.0)
MCHC: 33.3 g/dL (ref 30.0–36.0)
MCV: 87.2 fL (ref 78.0–100.0)
MONOS PCT: 7 %
Monocytes Absolute: 0.8 10*3/uL (ref 0.1–1.0)
NEUTROS ABS: 8.8 10*3/uL — AB (ref 1.7–7.7)
Neutrophils Relative %: 83 %
Platelets: 235 10*3/uL (ref 150–400)
RBC: 4.37 MIL/uL (ref 4.22–5.81)
RDW: 13.9 % (ref 11.5–15.5)
WBC: 10.7 10*3/uL — AB (ref 4.0–10.5)

## 2015-03-20 LAB — HEMOGLOBIN AND HEMATOCRIT, BLOOD
HEMATOCRIT: 38.8 % — AB (ref 39.0–52.0)
Hemoglobin: 12.9 g/dL — ABNORMAL LOW (ref 13.0–17.0)

## 2015-03-20 LAB — GLUCOSE, CAPILLARY
GLUCOSE-CAPILLARY: 128 mg/dL — AB (ref 65–99)
GLUCOSE-CAPILLARY: 134 mg/dL — AB (ref 65–99)
GLUCOSE-CAPILLARY: 139 mg/dL — AB (ref 65–99)
GLUCOSE-CAPILLARY: 99 mg/dL (ref 65–99)
Glucose-Capillary: 125 mg/dL — ABNORMAL HIGH (ref 65–99)
Glucose-Capillary: 121 mg/dL — ABNORMAL HIGH (ref 65–99)

## 2015-03-20 MED ORDER — AMLODIPINE BESYLATE 5 MG PO TABS
5.0000 mg | ORAL_TABLET | Freq: Every day | ORAL | Status: DC
  Administered 2015-03-20 – 2015-03-21 (×2): 5 mg via ORAL
  Filled 2015-03-20 (×2): qty 1

## 2015-03-20 MED ORDER — HYDROCHLOROTHIAZIDE 10 MG/ML ORAL SUSPENSION
25.0000 mg | Freq: Every day | ORAL | Status: DC
Start: 2015-03-20 — End: 2015-03-21
  Administered 2015-03-20 – 2015-03-21 (×2): 25 mg via ORAL
  Filled 2015-03-20 (×2): qty 2.5

## 2015-03-20 MED ORDER — AMLODIPINE BESYLATE 5 MG PO TABS
5.0000 mg | ORAL_TABLET | Freq: Once | ORAL | Status: AC
  Administered 2015-03-20: 5 mg via ORAL
  Filled 2015-03-20: qty 1

## 2015-03-20 MED ORDER — ESCITALOPRAM OXALATE 20 MG PO TABS
20.0000 mg | ORAL_TABLET | Freq: Every day | ORAL | Status: DC
  Administered 2015-03-21: 20 mg via ORAL
  Filled 2015-03-20: qty 1

## 2015-03-20 MED ORDER — LORAZEPAM 1 MG PO TABS
2.0000 mg | ORAL_TABLET | Freq: Every day | ORAL | Status: DC
  Administered 2015-03-20: 2 mg via ORAL
  Filled 2015-03-20: qty 2

## 2015-03-20 NOTE — Care Management Obs Status (Signed)
[place] NOTIFICATION   Patient Details  Name: WM RIVIELLO MRN: [id number] Date of Birth: January 20, 1950   Medicare Observation Status Notification Given:  Yes    MahabirJuliann Pulse, RN 03/20/2015, 1:51 PM

## 2015-03-20 NOTE — Progress Notes (Signed)
[place] [facility] Office:  [phone] General Surgery Progress Note   LOS: 1 day  POD -  1 Day Post-Op  Assessment/Plan: 1.  LAPAROSCOPIC GASTRIC SLEEVE RESECTION WITH  HIATAL HERNIA REPAIR, UPPER GI ENDOSCOPY - 03/19/2015 - D. [employer]  Looks okay early  Will start water today  Needs to ambulate more  2. UMBILICAL HERNIA WITHOUT OBSTRUCTION AND WITHOUT GANGRENE (K42.9) 3. HTN  BP up this AM.  Probably part due to pain, will restart his BP meds. 4. OSA x 8 years On CPAP 5. Quit smoking around 2012 6. DM, on oral hypoglycemics x 5 years 7. Chronic pain meds takes oxycodone about 1 to 3 times per day  8.  DVT prophylaxis - Lovenox  Active Problems:   Morbid obesity (HCC)   Subjective:  No nausea.  Needs to ambulate more.  Objective:   Filed Vitals:   03/20/15 0137 03/20/15 0524  BP: 189/95 189/95  Pulse: 103 90  Temp: 98.4 F (36.9 C) 99.5 F (37.5 C)  Resp: 18 18     Intake/Output from previous day:  03/07 0701 - 03/08 0700 In: 3425 [I.V.:3325; IV Piggyback:100] Out: 925 [Urine:875; Blood:50]  Intake/Output this shift:      Physical Exam:   General: Obese WM who is alert and oriented.    HEENT: Normal. Pupils equal. .   Lungs: Clear   Abdomen: Big, but soft   Wound: Clean   Lab Results:    Recent Labs  03/19/15 1923 03/20/15 0423  WBC  --  10.7*  HGB 13.1 12.7*  HCT 39.7 38.1*  PLT  --  235    BMET  No results for input(s): NA, K, CL, CO2, GLUCOSE, BUN, CREATININE, CALCIUM in the last 72 hours.  PT/INR  No results for input(s): LABPROT, INR in the last 72 hours.  ABG  No results for input(s): PHART, HCO3 in the last 72 hours.  Invalid input(s): PCO2, PO2   Studies/Results:  No results found.   Anti-infectives:   Anti-infectives    Start     Dose/Rate Route Frequency Ordered Stop   03/19/15 1115  cefoTEtan in Dextrose 5% (CEFOTAN) IVPB 2 g     2 g Intravenous On call to O.R. 03/19/15 1029 03/19/15 1215      Alphonsa Overall, MD, FACS Pager: [place] [facility] Office: [phone] 03/20/2015

## 2015-03-20 NOTE — Progress Notes (Signed)
Pt's vitals:   99.5, 90, 18, 189/95, 98% 2l.  Dr. Hassell Done paged to inform of increased b/p during night.  Awaiting return call

## 2015-03-20 NOTE — Progress Notes (Signed)
Pt refuse NIV for the night. Pt is stable at this time no distress noted.  

## 2015-03-20 NOTE — Care Management Note (Signed)
Case Management Note  Patient Details  Name: Dennis Mitchell MRN: [id number] Date of Birth: 07/21/1950  Subjective/Objective: 65 y/o m admitted w/morbid obesity, POD#1 lap gastric sleeve. From home.                   Action/Plan:d/c plan home.   Expected Discharge Date:                 Expected Discharge Plan:  Home/Self Care  In-House Referral:     Discharge planning Services  CM Consult  Post Acute Care Choice:    Choice offered to:     DME Arranged:    DME Agency:     HH Arranged:    HH Agency:     Status of Service:  In process, will continue to follow  Medicare Important Message Given:    Date Medicare IM Given:    Medicare IM give by:    Date Additional Medicare IM Given:    Additional Medicare Important Message give by:     If discussed at [place] of Stay Meetings, dates discussed:    Additional Comments:  Dessa Phi, RN 03/20/2015, 12:05 PM

## 2015-03-20 NOTE — Plan of Care (Signed)
Problem: Food- and Nutrition-Related Knowledge Deficit (NB-1.1) Goal: Nutrition education Formal process to instruct or train a patient/client in a skill or to impart knowledge to help patients/clients voluntarily manage or modify food choices and eating behavior to maintain or improve health. Outcome: Completed/Met Date Met:  03/20/15 Nutrition Education Note  Received consult for diet education per DROP protocol.   Discussed 2 week post op diet with pt. Emphasized that liquids must be non carbonated, non caffeinated, and sugar free. Fluid goals discussed. Reviewed progression of diet to include soft proteins at 7-10 days post-op. Pt to follow up with outpatient bariatric RD for further diet progression after 2 weeks. Multivitamins and minerals also reviewed. Teach back method used, pt expressed understanding, expect good compliance.   Diet: First 2 Weeks  You will see the dietitian about two (2) weeks after your surgery. The dietitian will increase the types of foods you can eat if you are handling liquids well:  If you have severe vomiting or nausea and cannot handle clear liquids lasting longer than 1 day, call your surgeon  Protein Shake  Drink at least 2 ounces of shake 5-6 times per day  Each serving of protein shakes (usually 8 - 12 ounces) should have a minimum of:  15 grams of protein  And no more than 5 grams of carbohydrate  Goal for protein each day:  Men = 80 grams per day  Women = 60 grams per day  Protein powder may be added to fluids such as non-fat milk or Lactaid milk or Soy milk (limit to 35 grams added protein powder per serving)   Hydration  Slowly increase the amount of water and other clear liquids as tolerated (See Acceptable Fluids)  Slowly increase the amount of protein shake as tolerated  Sip fluids slowly and throughout the day  May use sugar substitutes in small amounts (no more than 6 - 8 packets per day; i.e. Splenda)   Fluid Goal  The first goal is to  drink at least 8 ounces of protein shake/drink per day (or as directed by the nutritionist); some examples of protein shakes are [employer] Johnson, [employer]R [employer], EAS Edge HP, and Unjury. See handout from pre-op Bariatric Education Class:  Slowly increase the amount of protein shake you drink as tolerated  You may find it easier to slowly sip shakes throughout the day  It is important to get your proteins in first  Your fluid goal is to drink 64 - 100 ounces of fluid daily  It may take a few weeks to build up to this  32 oz (or more) should be clear liquids  And  32 oz (or more) should be full liquids (see below for examples)  Liquids should not contain sugar, caffeine, or carbonation   Clear Liquids:  Water or Sugar-free flavored water (i.e. Fruit H2O, Propel)  Decaffeinated coffee or tea (sugar-free)  Crystal Lite, Wyler's Lite, Minute Maid Lite  Sugar-free Jell-O  Bouillon or broth  Sugar-free Popsicle: *Less than 20 calories each; Limit 1 per day   Full Liquids:  Protein Shakes/Drinks + 2 choices per day of other full liquids  Full liquids must be:  No More Than 12 grams of Carbs per serving  No More Than 3 grams of Fat per serving  Strained low-fat cream soup  Non-Fat milk  Fat-free Lactaid Milk  Sugar-free yogurt (Dannon Lite & Fit, Greek yogurt)     Clayton Bibles, MS, RD, LDN Pager: [phone] After Hours Pager: [phone]

## 2015-03-20 NOTE — Progress Notes (Signed)
Patient alert and oriented, Post op day 1.  Provided support and encouragement.  Encouraged pulmonary toilet, ambulation and small sips of liquids.  All questions answered.  Will continue to monitor. 

## 2015-03-21 LAB — CBC WITH DIFFERENTIAL/PLATELET
Basophils Absolute: 0 10*3/uL (ref 0.0–0.1)
Basophils Relative: 0 %
EOS ABS: 0.1 10*3/uL (ref 0.0–0.7)
EOS PCT: 1 %
HCT: 38.8 % — ABNORMAL LOW (ref 39.0–52.0)
HEMOGLOBIN: 12.4 g/dL — AB (ref 13.0–17.0)
LYMPHS ABS: 1.5 10*3/uL (ref 0.7–4.0)
Lymphocytes Relative: 17 %
MCH: 28.4 pg (ref 26.0–34.0)
MCHC: 32 g/dL (ref 30.0–36.0)
MCV: 89 fL (ref 78.0–100.0)
MONOS PCT: 11 %
Monocytes Absolute: 1 10*3/uL (ref 0.1–1.0)
NEUTROS PCT: 71 %
Neutro Abs: 6.5 10*3/uL (ref 1.7–7.7)
Platelets: 213 10*3/uL (ref 150–400)
RBC: 4.36 MIL/uL (ref 4.22–5.81)
RDW: 14 % (ref 11.5–15.5)
WBC: 9.2 10*3/uL (ref 4.0–10.5)

## 2015-03-21 LAB — GLUCOSE, CAPILLARY
GLUCOSE-CAPILLARY: 107 mg/dL — AB (ref 65–99)
Glucose-Capillary: 139 mg/dL — ABNORMAL HIGH (ref 65–99)

## 2015-03-21 NOTE — Discharge Instructions (Signed)
[facility] - DISCHARGE INSTRUCTIONS TO PATIENT  Activity:  Driving - May drive in 3 or 4 days, if doing well   Lifting - No lifting more than 15 pounds for 10 days, then no limit  Wound Care:   May shower  Diet:  Post gastric sleeve diet  Follow up appointment:  Call Dr. Pollie Friar office [facility]) at [phone] for an appointment in 2 weeks.  Medications and dosages:  Resume your home medications.  You have a prescription for:  Oxycodone  Call Dr. Lucia Gaskins or his office  [phone]) if you have:  Temperature greater than 100.4,  Persistent nausea and vomiting,  Severe uncontrolled pain,  Redness, tenderness, or signs of infection (pain, swelling, redness, odor or green/yellow discharge around the site),  Difficulty breathing, headache or visual disturbances,  Any other questions or concerns you may have after discharge.  In an emergency, call 911 or go to an Emergency Department at a nearby hospital.       GASTRIC BYPASS/SLEEVE  Home Care Instructions   These instructions are to help you care for yourself when you go home.  Call: If you have any problems.  Call [phone] and ask for the surgeon on call  If you need immediate assistance come to the ER at [facility]. Tell the ER staff you are a new post-op gastric bypass or gastric sleeve patient  Signs and symptoms to report:  Severe  vomiting or nausea o If you cannot handle clear liquids for longer than 1 day, call your surgeon  Abdominal pain which does not get better after taking your pain medication  Fever greater than 100.4  F and chills  Heart rate over 100 beats a minute  Trouble breathing  Chest pain  Redness,  swelling, drainage, or foul odor at incision (surgical) sites  If your incisions open or pull apart  Swelling or pain in calf (lower leg)  Diarrhea (Loose bowel movements that happen often), frequent watery, uncontrolled bowel movements  Constipation, (no  bowel movements for 3 days) if this happens: o Take Milk of Magnesia, 2 tablespoons by mouth, 3 times a day for 2 days if needed o Stop taking Milk of Magnesia once you have had a bowel movement o Call your doctor if constipation continues Or o Take Miralax  (instead of Milk of Magnesia) following the label instructions o Stop taking Miralax once you have had a bowel movement o Call your doctor if constipation continues  Anything you think is abnormal for you   Normal side effects after surgery:  Unable to sleep at night or unable to concentrate  Irritability  Being tearful (crying) or depressed  These are common complaints, possibly related to your anesthesia, stress of surgery, and change in lifestyle, that usually go away a few weeks after surgery. If these feelings continue, call your medical doctor.  Wound Care: You may have surgical glue, steri-strips, or staples over your incisions after surgery  Surgical glue: Looks like clear film over your incisions and will wear off a little at a time  Steri-strips: Adhesive strips of tape over your incisions. You may notice a yellowish color on skin under the steri-strips. This is used to make the steri-strips stick better. Do not pull the steri-strips off - let them fall off  Staples: Staples may be removed before you leave the hospital o If you go home with staples, call [facility] for an appointment with your surgeons nurse to have staples removed  10 days after surgery, [phone]  Showering: You may shower two (2) days after your surgery unless your surgeon tells you differently o Wash gently around incisions with warm soapy water, rinse well, and gently pat dry o If you have a drain (tube from your incision), you may need someone to hold this while you shower o No tub baths until staples are removed and incisions are healed   Medications:  Medications should be liquid or crushed if larger than the size of a  dime  Extended release pills (medication that releases a little bit at a time through the  day) should not be crushed  Depending on the size and number of medications you take, you may need to space (take a few throughout the day)/change the time you take your medications so that you do not over-fill your pouch (smaller stomach)  Make sure you follow-up with you primary care physician to make medication changes needed during rapid weight loss and life -style changes  If you have diabetes, follow up with your doctor that orders your diabetes medication(s) within one week after surgery and check your blood sugar regularly   Do not drive while taking narcotics (pain medications)   Do not take acetaminophen (Tylenol) and Roxicet or Lortab Elixir at the same time since these pain medications contain acetaminophen   Diet:  First 2 Weeks You will see the nutritionist about two (2) weeks after your surgery. The nutritionist will increase the types of foods you can eat if you are handling liquids well:  If you have severe vomiting or nausea and cannot handle clear liquids lasting longer than 1 day call your surgeon Protein Shake  Drink at least 2 ounces of shake 5-6 times per day  Each serving of protein shakes (usually 8-12 ounces) should have a minimum of: o 15 grams of protein o And no more than 5 grams of carbohydrate  Goal for protein each day: o Men = 80 grams per day o Women = 60 grams per day     Protein powder may be added to fluids such as non-fat milk or Lactaid milk or Soy milk (limit to 35 grams added protein powder per serving)  Hydration  Slowly increase the amount of water and other clear liquids as tolerated (See Acceptable Fluids)  Slowly increase the amount of protein shake as tolerated  Sip fluids slowly and throughout the day  May use sugar substitutes in small amounts (no more than 6-8 packets per day; i.e. Splenda)  Fluid Goal  The first goal is to drink  at least 8 ounces of protein shake/drink per day (or as directed by the nutritionist); some examples of protein shakes are Johnson [employer] Johnson, AMR [employer], EAS Edge HP, and Unjury. - See handout from pre-op Bariatric Education Class: o Slowly increase the amount of protein shake you drink as tolerated o You may find it easier to slowly sip shakes throughout the day o It is important to get your proteins in first  Your fluid goal is to drink 64-100 ounces of fluid daily o It may take a few weeks to build up to this   32 oz. (or more) should be clear liquids And  32 oz. (or more) should be full liquids (see below for examples)  Liquids should not contain sugar, caffeine, or carbonation  Clear Liquids:  Water of Sugar-free flavored water (i.e. Fruit HO, Propel)  Decaffeinated coffee or tea (sugar-free)  Crystal lite, Wylers Lite, Minute [facility]  Sugar-free Jell-O  Bouillon or broth  Sugar-free Popsicle:    - Less than 20 calories each; Limit 1 per day  Full Liquids:                   Protein Shakes/Drinks + 2 choices per day of other full liquids  Full liquids must be: o No More Than 12 grams of Carbs per serving o No More Than 3 grams of Fat per serving  Strained low-fat cream soup  Non-Fat milk  Fat-free Lactaid Milk  Sugar-free yogurt (Dannon Lite & Fit, Greek yogurt)    Vitamins and Minerals  Start 1 day after surgery unless otherwise directed by your surgeon  2 Chewable Multivitamin / Multimineral Supplement with iron (i.e. Centrum for Adults)  Vitamin B-12, 350-500 micrograms sub-lingual (place tablet under the tongue) each day  Chewable Calcium Citrate with Vitamin D-3 (Example: 3 Chewable Calcium  Plus 600 with Vitamin D-3) o Take 500 mg three (3) times a day for a total of 1500 mg each day o Do not take all 3 doses of calcium at one time as it may cause constipation, and you can only absorb 500 mg at a time o Do not mix multivitamins containing iron  with calcium supplements;  take 2 hours apart o Do not substitute Tums (calcium carbonate) for your calcium  Menstruating women and those at risk for anemia ( a blood disease that causes weakness) may need extra iron o Talk to your doctor to see if you need more iron  If you need extra iron: Total daily Iron recommendation (including Vitamins) is 50 to 100 mg Iron/day  Do not stop taking or change any vitamins or minerals until you talk to your nutritionist or surgeon  Your nutritionist and/or surgeon must approve all vitamin and mineral supplements   Activity and Exercise: It is important to continue walking at home. Limit your physical activity as instructed by your doctor. During this time, use these guidelines:  Do not lift anything greater than ten  (10) pounds for at least two (2) weeks  Do not go back to work or drive until Engineer, production says you can  You may have sex when you feel comfortable o It is VERY important for male patients to use a reliable birth control method; fertility often increase after surgery o Do not get pregnant for at least 18 months  Start exercising as soon as your doctor tells you that you can o Make sure your doctor approves any physical activity  Start with a simple walking program  Walk 5-15 minutes each day, 7 days per week  Slowly increase until you are walking 30-45 minutes per day  Consider joining our [place] program. [phone] or email [email or web address]   Special Instructions Things to remember:  Free counseling is available for you and your family through collaboration between [facility] and [facility]. Please call [phone] and leave a message  Use your CPAP when sleeping if this applies to you  Consider buying a medical alert bracelet that says you had lap-band surgery     You will likely have your first fill (fluid added to your band) 6 - 8 weeks after surgery  [facility] has a free Bariatric Surgery Support Group that  meets monthly, the 3rd Thursday, [place]. You can see classes online at [email or web address]  It is very important to keep all follow up appointments with your surgeon, nutritionist, primary care physician, and behavioral  health practitioner o After the first year, please follow up with your bariatric surgeon and nutritionist at least once a year in order to maintain best weight loss results                    [facility]:  [place]: [phone]               Bariatric Nurse Coordinator: [phone]  Gastric Bypass/Sleeve Home Care Instructions  Rev. 02/2012                                                         Reviewed and Endorsed                                                    by [facility] Patient Education Committee, Jan, 2014

## 2015-03-21 NOTE — Progress Notes (Signed)
Assessment unchanged. Pt verbalized understanding of dc instructions including follow up care and when to call the doctor. Vilinda Flake, RN completed bariatric teaching with pt prior to dc. Discharged via wc to front entrance to meet girlfriend and awaiting vehicle to carry home. Accompanied by NT.

## 2015-03-21 NOTE — Progress Notes (Signed)
Patient alert and oriented, pain is controlled. Patient is tolerating fluids,  advanced to protein shake today, patient tolerated well.  Reviewed Gastric sleeve discharge instructions with patient and patient is able to articulate understanding.  Provided information on BELT program, Support Group and [facility] outpatient pharmacy. All questions answered, will continue to monitor.  

## 2015-03-21 NOTE — Discharge Summary (Signed)
Physician Discharge Summary  Patient ID:  Dennis Mitchell  MRN: [id number]  DOB/AGE: 1950/11/06 65 y.o.  Admit date: 03/19/2015 Discharge date: 03/21/2015  Discharge Diagnoses:  1.  Morbid obesity  weight 268, BMI of [id number] 2. UMBILICAL HERNIA WITHOUT OBSTRUCTION AND WITHOUT GANGRENE (K42.9) 3. HTN  4. OSA x 8 years On CPAP 5. Quit smoking around 2012 6. DM, on oral hypoglycemics x 5 years 7. Chronic pain meds takes oxycodone about 1 to 3 times per day   Active Problems:   Morbid obesity ([place])  Operation: Procedure(s): LAPAROSCOPIC GASTRIC SLEEVE RESECTION WITH  HIATAL HERNIA REPAIR UPPER GI ENDOSCOPY on 03/19/2015 - [facility]  Discharged Condition: good  Hospital Course: Dennis Mitchell is an 65 y.o. male whose primary care physician is Mickie Hillier, MD and who was admitted 03/19/2015 with a chief complaint of morbid obesity.   He was brought to the operating room on 03/19/2015 and underwent  LAPAROSCOPIC GASTRIC SLEEVE RESECTION WITH  HIATAL HERNIA REPAIR UPPER GI ENDOSCOPY.   He is now 2 days post op. Has taken his protein drinks. He is ready to go home.  The discharge instructions were reviewed with the patient.  Consults: None  Significant Diagnostic Studies: Results for orders placed or performed during the hospital encounter of 03/19/15  Glucose, capillary  Result Value Ref Range   Glucose-Capillary 158 (H) 65 - 99 mg/dL  Glucose, capillary  Result Value Ref Range   Glucose-Capillary 147 (H) 65 - 99 mg/dL  Hemoglobin and hematocrit, blood  Result Value Ref Range   Hemoglobin 13.1 13.0 - 17.0 g/dL   HCT 39.7 39.0 - 52.0 %  CBC WITH DIFFERENTIAL  Result Value Ref Range   WBC 10.7 (H) 4.0 - 10.5 K/uL   RBC 4.37 4.22 - 5.81 MIL/uL   Hemoglobin 12.7 (L) 13.0 - 17.0 g/dL   HCT 38.1 (L) 39.0 - 52.0 %   MCV 87.2 78.0 - 100.0 fL   MCH 29.1 26.0 - 34.0 pg   MCHC 33.3 30.0 - 36.0 g/dL   RDW 13.9 11.5 - 15.5 %   Platelets 235 150 - 400 K/uL   Neutrophils Relative % 83 %   Lymphocytes Relative 10 %   Monocytes Relative 7 %   Eosinophils Relative 0 %   Basophils Relative 0 %   Neutro Abs 8.8 (H) 1.7 - 7.7 K/uL   Lymphs Abs 1.1 0.7 - 4.0 K/uL   Monocytes Absolute 0.8 0.1 - 1.0 K/uL   Eosinophils Absolute 0.0 0.0 - 0.7 K/uL   Basophils Absolute 0.0 0.0 - 0.1 K/uL   Smear Review MORPHOLOGY UNREMARKABLE   Glucose, capillary  Result Value Ref Range   Glucose-Capillary 135 (H) 65 - 99 mg/dL  Glucose, capillary  Result Value Ref Range   Glucose-Capillary 112 (H) 65 - 99 mg/dL  Hemoglobin and hematocrit, blood  Result Value Ref Range   Hemoglobin 12.9 (L) 13.0 - 17.0 g/dL   HCT 38.8 (L) 39.0 - 52.0 %  Glucose, capillary  Result Value Ref Range   Glucose-Capillary 128 (H) 65 - 99 mg/dL  Glucose, capillary  Result Value Ref Range   Glucose-Capillary 121 (H) 65 - 99 mg/dL  Glucose, capillary  Result Value Ref Range   Glucose-Capillary 134 (H) 65 - 99 mg/dL  Glucose, capillary  Result Value Ref Range   Glucose-Capillary 99 65 - 99 mg/dL  CBC with Differential  Result Value Ref Range   WBC 9.2 4.0 - 10.5 K/uL   RBC 4.36  4.22 - 5.81 MIL/uL   Hemoglobin 12.4 (L) 13.0 - 17.0 g/dL   HCT 38.8 (L) 39.0 - 52.0 %   MCV 89.0 78.0 - 100.0 fL   MCH 28.4 26.0 - 34.0 pg   MCHC 32.0 30.0 - 36.0 g/dL   RDW 14.0 11.5 - 15.5 %   Platelets 213 150 - 400 K/uL   Neutrophils Relative % 71 %   Neutro Abs 6.5 1.7 - 7.7 K/uL   Lymphocytes Relative 17 %   Lymphs Abs 1.5 0.7 - 4.0 K/uL   Monocytes Relative 11 %   Monocytes Absolute 1.0 0.1 - 1.0 K/uL   Eosinophils Relative 1 %   Eosinophils Absolute 0.1 0.0 - 0.7 K/uL   Basophils Relative 0 %   Basophils Absolute 0.0 0.0 - 0.1 K/uL  Glucose, capillary  Result Value Ref Range   Glucose-Capillary 125 (H) 65 - 99 mg/dL  Glucose, capillary  Result Value Ref Range   Glucose-Capillary 139 (H) 65 - 99 mg/dL  Glucose, capillary  Result Value Ref Range   Glucose-Capillary 107 (H) 65 - 99  mg/dL    No results found.  Discharge Exam:  Filed Vitals:   03/21/15 0210 03/21/15 0551  BP: 151/69 161/79  Pulse: 81 83  Temp: 98.4 F (36.9 C) 98.6 F (37 C)  Resp: 18 18    General: Obese WM who is alert and generally healthy appearing.  Lungs: Clear to auscultation and symmetric breath sounds. Heart:  RRR. No murmur or rub. Abdomen: Soft.  No hernia. Normal bowel sounds. Incisions look good.  Discharge Medications:     Medication List    ASK your doctor about these medications        amLODipine 5 MG tablet  Commonly known as:  NORVASC  TAKE ONE TABLET BY MOUTH ONCE DAILY IN THE MORNING     cyclobenzaprine 10 MG tablet  Commonly known as:  FLEXERIL  TAKE ONE-HALF TO ONE TABLET BY MOUTH THREE TIMES DAILY AS NEEDED FOR SPASMS     EPINEPHrine 0.3 mg/0.3 mL Devi  Commonly known as:  EPI-PEN  Inject 0.3 mLs (0.3 mg total) into the muscle once.     EPINEPHrine 0.3 mg/0.3 mL Soaj injection  Commonly known as:  EPI-PEN  USE AS DIRECTED     escitalopram 20 MG tablet  Commonly known as:  LEXAPRO  Take 1 tablet (20 mg total) by mouth daily.     fluorouracil 5 % cream  Commonly known as:  EFUDEX  Apply 1 application topically daily as needed. For skin irritation on scalp     glipiZIDE 5 MG tablet  Commonly known as:  GLUCOTROL  TAKE ONE TABLET BY MOUTH TWICE DAILY **NEEDS  OFFICE  VISIT**     hydrochlorothiazide 25 MG tablet  Commonly known as:  HYDRODIURIL  Take 1 tablet (25 mg total) by mouth daily.     KLOR-CON M20 20 MEQ tablet  Generic drug:  potassium chloride SA  TAKE ONE TABLET BY MOUTH ONCE DAILY IN THE MORNING     LORazepam 2 MG tablet  Commonly known as:  ATIVAN  TAKE ONE TABLET BY MOUTH ONCE DAILY AT BEDTIME AS NEEDED FOR SLEEP     meclizine 25 MG tablet  Commonly known as:  ANTIVERT  TAKE ONE TABLET BY MOUTH THREE TIMES DAILY AS NEEDED FOR DIZZINESS     metFORMIN 1000 MG tablet  Commonly known as:  GLUCOPHAGE  TAKE ONE TABLET BY MOUTH IN  THE MORNING AND  ONE-HALF TAB WITH LUNCH, THEN ONE TAB IN THE EVENING     ondansetron 4 MG disintegrating tablet  Commonly known as:  ZOFRAN-ODT  Take 4 mg by mouth every 8 (eight) hours as needed for nausea or vomiting.     oxyCODONE-acetaminophen 5-325 MG tablet  Commonly known as:  PERCOCET/ROXICET  Take 1 tablet by mouth 3 (three) times daily as needed. pain     pantoprazole 40 MG tablet  Commonly known as:  PROTONIX  Take 40 mg by mouth daily.     sildenafil 20 MG tablet  Commonly known as:  REVATIO  Take 5 tablets po prn 2 hours before sex     VOLTAREN 1 % Gel  Generic drug:  diclofenac sodium  APPLY TOPICALLY TWICE DAILY TO AFFECTED AREA AS NEEDED        Disposition: 01-Home or Self Care   Activity:  Driving - May drive in 3 or 4 days, if doing well   Lifting - No lifting more than 15 pounds for 10 days, then no limit  Wound Care:   May shower  Diet:  Post gastric sleeve diet  Follow up appointment:  Call Dr. Pollie Friar office [facility]) at [phone] for an appointment in 2 weeks.  Medications and dosages:  Resume your home medications.  You have a prescription for:  Oxycodone    Signed: Alphonsa Overall, M.D., [facility] Office:  [phone]  03/21/2015, 7:29 AM

## 2015-03-21 NOTE — Care Management Note (Signed)
Case Management Note  Patient Details  Name: Dennis Mitchell MRN: [id number] Date of Birth: 1950/05/04  Subjective/Objective:                    Action/Plan:d/c home no needs or orders.   Expected Discharge Date:                 Expected Discharge Plan:  Home/Self Care  In-House Referral:     Discharge planning Services  CM Consult  Post Acute Care Choice:    Choice offered to:     DME Arranged:    DME Agency:     HH Arranged:    [place] Agency:     Status of Service:  Completed, signed off  Medicare Important Message Given:    Date Medicare IM Given:    Medicare IM give by:    Date Additional Medicare IM Given:    Additional Medicare Important Message give by:     If discussed at [place] of Stay Meetings, dates discussed:    Additional Comments:  Dessa Phi, RN 03/21/2015, 11:33 AM

## 2015-03-26 ENCOUNTER — Other Ambulatory Visit: Payer: Self-pay | Admitting: Family Medicine

## 2015-03-28 ENCOUNTER — Ambulatory Visit: Payer: Commercial Managed Care - HMO | Admitting: Family Medicine

## 2015-03-28 ENCOUNTER — Telehealth (HOSPITAL_COMMUNITY): Payer: Self-pay

## 2015-03-28 NOTE — Telephone Encounter (Signed)

## 2015-04-03 ENCOUNTER — Telehealth: Payer: Self-pay | Admitting: *Deleted

## 2015-04-03 NOTE — Telephone Encounter (Signed)
Incoming fax from pharm requesting lidocaine 5%pain ointment and diclofenac sodium solution 1.5%. See form in yellow folder. Pt last seen for check up on 02/08/15

## 2015-04-13 ENCOUNTER — Other Ambulatory Visit: Payer: Self-pay | Admitting: Family Medicine

## 2015-04-25 ENCOUNTER — Other Ambulatory Visit: Payer: Self-pay | Admitting: Family Medicine

## 2015-05-09 ENCOUNTER — Encounter: Payer: Self-pay | Admitting: Family Medicine

## 2015-05-09 ENCOUNTER — Ambulatory Visit (INDEPENDENT_AMBULATORY_CARE_PROVIDER_SITE_OTHER): Payer: Commercial Managed Care - HMO | Admitting: Family Medicine

## 2015-05-09 VITALS — BP 122/78 | Ht 67.0 in | Wt 254.5 lb

## 2015-05-09 DIAGNOSIS — I10 Essential (primary) hypertension: Secondary | ICD-10-CM

## 2015-05-09 DIAGNOSIS — M549 Dorsalgia, unspecified: Secondary | ICD-10-CM | POA: Diagnosis not present

## 2015-05-09 DIAGNOSIS — I878 Other specified disorders of veins: Secondary | ICD-10-CM

## 2015-05-09 DIAGNOSIS — E119 Type 2 diabetes mellitus without complications: Secondary | ICD-10-CM | POA: Diagnosis not present

## 2015-05-09 DIAGNOSIS — G8929 Other chronic pain: Secondary | ICD-10-CM | POA: Diagnosis not present

## 2015-05-09 LAB — POCT GLYCOSYLATED HEMOGLOBIN (HGB A1C): HEMOGLOBIN A1C: 5.2

## 2015-05-09 MED ORDER — OXYCODONE-ACETAMINOPHEN 5-325 MG PO TABS: 1.0000 | ORAL_TABLET | Freq: Three times a day (TID) | ORAL | Status: DC | PRN

## 2015-05-09 MED ORDER — METFORMIN HCL 1000 MG PO TABS: ORAL_TABLET | ORAL | Status: DC

## 2015-05-09 MED ORDER — HYDROCHLOROTHIAZIDE 25 MG PO TABS: 25.0000 mg | ORAL_TABLET | Freq: Every day | ORAL | Status: DC

## 2015-05-09 MED ORDER — AMLODIPINE BESYLATE 2.5 MG PO TABS: 2.5000 mg | ORAL_TABLET | Freq: Every day | ORAL | Status: DC

## 2015-05-09 MED ORDER — POTASSIUM CHLORIDE CRYS ER 20 MEQ PO TBCR: EXTENDED_RELEASE_TABLET | ORAL | Status: DC

## 2015-05-09 MED ORDER — LEXAPRO 20 MG PO TABS: 20.0000 mg | ORAL_TABLET | Freq: Every day | ORAL | Status: DC

## 2015-05-09 NOTE — Progress Notes (Signed)
   Subjective:    Patient ID: Dennis Mitchell, Dennis Mitchell    DOB: 1950/10/24, 65 y.o.   MRN: [id number]  Diabetes He presents for his follow-up diabetic visit. He has type 2 diabetes mellitus. No MedicAlert identification noted. He has had a previous visit with a dietitian. He does not see a podiatrist.Eye exam is not current.   compliant with blood pressure medicine. Does not miss a dose. Meds reviewed today. No obvious side effects.  Patient on chronic pain medicine for neuropathic pain. States she definitely needs his pain in order to maintain his function. No obvious side effects   Sp gastric sleeve proc, overall tolerated procedure well. Has artery begun to lose substantial weight. Wished he did a years ago feels much more energy 32 pound weight loss with diet and exercise Results for orders placed or performed in visit on 05/09/15  POCT HgB A1C  Result Value Ref Range   Hemoglobin A1C 5.2    compliant with diabetes medicine. Reports no hypoglycemic episodes. Review of Systems No headache, no major weight loss or weight gain, no chest pain no back pain abdominal pain no change in bowel habits complete ROS otherwise negative     Objective:   Physical Exam  blood pressure 108/70 on repeat   alert vitals stable weight down 30 pounds HEENT normal lungs clear heart rare rhythm ankles venous stasis changes evident distal pulses intact       Assessment & Plan:  Impression #1 type 2 diabetes control basically to tight discuss likely result #3 #2 hypertension controlled basically to try once again discussed in context of current meds likely contribute by #3 #3 status post gastric sleeve with 30 pound weight loss #4 chronic pain in need of meds plan medications refilled. Diet exercise discussed. Cut Norvasc down to 2.5 mg. Stop Glucotrol rationale discussed. Pain meds refilled warning signs side effects discussed recheck in several months patient congratulated on weight loss WSL

## 2015-05-13 ENCOUNTER — Ambulatory Visit (HOSPITAL_COMMUNITY)
Admission: RE | Admit: 2015-05-13 | Discharge: 2015-05-13 | Disposition: A | Discharge status: Home / Self Care | Payer: Commercial Managed Care - HMO | Source: Ambulatory Visit | Attending: Family Medicine | Admitting: Family Medicine

## 2015-05-13 DIAGNOSIS — R937 Abnormal findings on diagnostic imaging of other parts of musculoskeletal system: Secondary | ICD-10-CM | POA: Diagnosis not present

## 2015-05-13 DIAGNOSIS — E119 Type 2 diabetes mellitus without complications: Secondary | ICD-10-CM | POA: Diagnosis not present

## 2015-05-13 DIAGNOSIS — M25561 Pain in right knee: Secondary | ICD-10-CM | POA: Diagnosis not present

## 2015-05-16 ENCOUNTER — Other Ambulatory Visit: Payer: Self-pay | Admitting: Family Medicine

## 2015-06-14 ENCOUNTER — Other Ambulatory Visit: Payer: Self-pay | Admitting: Family Medicine

## 2015-06-14 NOTE — Telephone Encounter (Signed)
Ok six mo worth 

## 2015-06-18 ENCOUNTER — Other Ambulatory Visit: Payer: Self-pay | Admitting: Family Medicine

## 2015-07-02 DIAGNOSIS — Z9884 Bariatric surgery status: Secondary | ICD-10-CM | POA: Diagnosis not present

## 2015-07-04 DIAGNOSIS — Z9884 Bariatric surgery status: Secondary | ICD-10-CM | POA: Diagnosis not present

## 2015-07-10 ENCOUNTER — Other Ambulatory Visit: Payer: Self-pay | Admitting: Family Medicine

## 2015-08-08 ENCOUNTER — Ambulatory Visit: Payer: Commercial Managed Care - HMO | Admitting: Family Medicine

## 2015-08-13 ENCOUNTER — Encounter: Payer: Self-pay | Admitting: Family Medicine

## 2015-08-13 ENCOUNTER — Ambulatory Visit (INDEPENDENT_AMBULATORY_CARE_PROVIDER_SITE_OTHER): Payer: Commercial Managed Care - HMO | Admitting: Family Medicine

## 2015-08-13 VITALS — BP 122/84 | Ht 66.0 in | Wt 232.0 lb

## 2015-08-13 DIAGNOSIS — I10 Essential (primary) hypertension: Secondary | ICD-10-CM | POA: Diagnosis not present

## 2015-08-13 DIAGNOSIS — Z79899 Other long term (current) drug therapy: Secondary | ICD-10-CM

## 2015-08-13 DIAGNOSIS — Z13220 Encounter for screening for lipoid disorders: Secondary | ICD-10-CM

## 2015-08-13 DIAGNOSIS — Z125 Encounter for screening for malignant neoplasm of prostate: Secondary | ICD-10-CM

## 2015-08-13 DIAGNOSIS — E119 Type 2 diabetes mellitus without complications: Secondary | ICD-10-CM | POA: Diagnosis not present

## 2015-08-13 DIAGNOSIS — Z79891 Long term (current) use of opiate analgesic: Secondary | ICD-10-CM

## 2015-08-13 LAB — POCT GLYCOSYLATED HEMOGLOBIN (HGB A1C): Hemoglobin A1C: 5.3

## 2015-08-13 MED ORDER — METFORMIN HCL 500 MG PO TABS: ORAL_TABLET | ORAL | 2 refills | Status: DC

## 2015-08-13 MED ORDER — OXYCODONE-ACETAMINOPHEN 5-325 MG PO TABS: ORAL_TABLET | ORAL | 0 refills | Status: DC

## 2015-08-13 MED ORDER — OXYCODONE-ACETAMINOPHEN 5-325 MG PO TABS
ORAL_TABLET | ORAL | 0 refills | Status: DC
Start: 2015-08-13 — End: 2015-11-14

## 2015-08-13 NOTE — Progress Notes (Signed)
   Subjective:    Patient ID: Dennis Mitchell, male    DOB: 02/28/1950, 65 y.o.   MRN: [id number]  HPI This patient was seen today for chronic pain  The medication list was reviewed and updated.   -Compliance with medication: yes  - Number patient states they take daily: mostly one to two a day. Rarely takes three a day  -when was the last dose patient took? this  The patient was advised the importance of maintaining medication and not using illegal substances with these.  Refills needed: has enough for the next two months.   The patient was educated that we can provide 3 monthly scripts for their medication, it is their responsibility to follow the instructions.  Side effects or complications from medications: none  Patient is aware that pain medications are meant to minimize the severity of the pain to allow their pain levels to improve to allow for better function. They are aware of that pain medications cannot totally remove their pain.  Due for UDT ( at least once per year) : due today  Diabetes. a1c today. 5.3. Pt does not check blood sugars.  Results for orders placed or performed in visit on 08/13/15  POCT glycosylated hemoglobin (Hb A1C)  Result Value Ref Range   Hemoglobin A1C 5.3     Bp meds   >htn Blood pressure medicine and blood pressure levels reviewed today with patient. Compliant with blood pressure medicine. States does not miss a dose. No obvious side effects. Blood pressure generally good when checked elsewhere. Watching salt intake.  Takes one pain med every morn, then on occas day need to take two or three  Feels light headeAt times when rising quickly  Patient claims compliance with diabetes medication. No obvious side effects. Reports no substantial low sugar spells. Most numbers are generally in good range when checked fasting. Generally does not miss a dose of medication. Watching diabetic diet closely              Review of Systems No  headache, no major weight loss or weight gain, no chest pain no back pain abdominal pain no change in bowel habits complete ROS otherwise negative     Objective:   Physical Exam  Alert vitals stable, NAD. Blood pressure good on repeat. HEENT normal. Lungs clear. Heart regular rate and rhythm.       Assessment & Plan:  Impression 1 chronic pain discuss with ongoing need for medication #2 hypertension good control discussed maintain same meds #3 diabetes discussed A1c excellent maintain same plan time for appropriate blood work. Exercise discussed encouraged diet discussed medications written pain meds written recheck in several months WSL

## 2015-08-21 LAB — TOXASSURE SELECT 13 (MW), URINE: PDF: 0

## 2015-08-26 ENCOUNTER — Other Ambulatory Visit: Payer: Self-pay

## 2015-08-26 MED ORDER — CITALOPRAM HYDROBROMIDE 40 MG PO TABS: 40.0000 mg | ORAL_TABLET | Freq: Every day | ORAL | 5 refills | Status: DC

## 2015-09-05 ENCOUNTER — Other Ambulatory Visit: Payer: Self-pay | Admitting: Family Medicine

## 2015-09-06 ENCOUNTER — Other Ambulatory Visit: Payer: Self-pay | Admitting: Family Medicine

## 2015-09-26 DIAGNOSIS — Z9884 Bariatric surgery status: Secondary | ICD-10-CM | POA: Diagnosis not present

## 2015-10-03 DIAGNOSIS — Z9884 Bariatric surgery status: Secondary | ICD-10-CM | POA: Diagnosis not present

## 2015-11-08 DIAGNOSIS — E119 Type 2 diabetes mellitus without complications: Secondary | ICD-10-CM | POA: Diagnosis not present

## 2015-11-08 DIAGNOSIS — Z79899 Other long term (current) drug therapy: Secondary | ICD-10-CM | POA: Diagnosis not present

## 2015-11-08 DIAGNOSIS — I10 Essential (primary) hypertension: Secondary | ICD-10-CM | POA: Diagnosis not present

## 2015-11-08 DIAGNOSIS — Z13220 Encounter for screening for lipoid disorders: Secondary | ICD-10-CM | POA: Diagnosis not present

## 2015-11-08 DIAGNOSIS — Z125 Encounter for screening for malignant neoplasm of prostate: Secondary | ICD-10-CM | POA: Diagnosis not present

## 2015-11-09 LAB — HEPATIC FUNCTION PANEL
ALBUMIN: 4.1 g/dL (ref 3.6–4.8)
ALT: 15 IU/L (ref 0–44)
AST: 16 IU/L (ref 0–40)
Alkaline Phosphatase: 59 IU/L (ref 39–117)
BILIRUBIN TOTAL: 0.5 mg/dL (ref 0.0–1.2)
BILIRUBIN, DIRECT: 0.15 mg/dL (ref 0.00–0.40)
TOTAL PROTEIN: 7 g/dL (ref 6.0–8.5)

## 2015-11-09 LAB — LIPID PANEL
Chol/HDL Ratio: 3 ratio units (ref 0.0–5.0)
Cholesterol, Total: 166 mg/dL (ref 100–199)
HDL: 56 mg/dL (ref >39)
LDL Calculated: 98 mg/dL (ref 0–99)
Triglycerides: 61 mg/dL (ref 0–149)
VLDL CHOLESTEROL CAL: 12 mg/dL (ref 5–40)

## 2015-11-09 LAB — BASIC METABOLIC PANEL
BUN / CREAT RATIO: 15 (ref 10–24)
BUN: 14 mg/dL (ref 8–27)
CALCIUM: 9.2 mg/dL (ref 8.6–10.2)
CO2: 26 mmol/L (ref 18–29)
Chloride: 94 mmol/L — ABNORMAL LOW (ref 96–106)
Creatinine, Ser: 0.93 mg/dL (ref 0.76–1.27)
GFR, EST AFRICAN AMERICAN: 99 mL/min/{1.73_m2} (ref >59)
GFR, EST NON AFRICAN AMERICAN: 86 mL/min/{1.73_m2} (ref >59)
Glucose: 127 mg/dL — ABNORMAL HIGH (ref 65–99)
Potassium: 3.6 mmol/L (ref 3.5–5.2)
Sodium: 137 mmol/L (ref 134–144)

## 2015-11-09 LAB — MICROALBUMIN / CREATININE URINE RATIO
Creatinine, Urine: 75.1 mg/dL
MICROALB/CREAT RATIO: 128.4 mg/g{creat} — AB (ref 0.0–30.0)
MICROALBUM., U, RANDOM: 96.4 ug/mL

## 2015-11-09 LAB — PSA: Prostate Specific Ag, Serum: 0.3 ng/mL (ref 0.0–4.0)

## 2015-11-11 ENCOUNTER — Other Ambulatory Visit: Payer: Self-pay | Admitting: Family Medicine

## 2015-11-14 ENCOUNTER — Encounter: Payer: Self-pay | Admitting: Family Medicine

## 2015-11-14 ENCOUNTER — Ambulatory Visit (INDEPENDENT_AMBULATORY_CARE_PROVIDER_SITE_OTHER): Payer: Commercial Managed Care - HMO | Admitting: Family Medicine

## 2015-11-14 VITALS — BP 128/80 | Ht 66.0 in | Wt 213.1 lb

## 2015-11-14 DIAGNOSIS — M5440 Lumbago with sciatica, unspecified side: Secondary | ICD-10-CM

## 2015-11-14 DIAGNOSIS — I10 Essential (primary) hypertension: Secondary | ICD-10-CM

## 2015-11-14 DIAGNOSIS — Z23 Encounter for immunization: Secondary | ICD-10-CM | POA: Diagnosis not present

## 2015-11-14 DIAGNOSIS — G8929 Other chronic pain: Secondary | ICD-10-CM

## 2015-11-14 DIAGNOSIS — E119 Type 2 diabetes mellitus without complications: Secondary | ICD-10-CM

## 2015-11-14 LAB — POCT GLYCOSYLATED HEMOGLOBIN (HGB A1C): HEMOGLOBIN A1C: 5.1

## 2015-11-14 MED ORDER — OXYCODONE-ACETAMINOPHEN 5-325 MG PO TABS: ORAL_TABLET | ORAL | 0 refills | Status: DC

## 2015-11-14 MED ORDER — METFORMIN HCL 500 MG PO TABS: 500.0000 mg | ORAL_TABLET | Freq: Every day | ORAL | 1 refills | Status: DC

## 2015-11-14 NOTE — Progress Notes (Signed)
   Subjective:    Patient ID: Dennis Mitchell, male    DOB: Aug 21, 1950, 65 y.o.   MRN: [id number]  Diabetes  He presents for his follow-up diabetic visit. He has type 2 diabetes mellitus. There are no hypoglycemic associated symptoms. There are no diabetic associated symptoms. There are no hypoglycemic complications. There are no diabetic complications. There are no known risk factors for coronary artery disease. Current diabetic treatment includes oral agent (monotherapy). He is compliant with treatment all of the time.   Results for orders placed or performed in visit on 11/14/15  POCT glycosylated hemoglobin (Hb A1C)  Result Value Ref Range   Hemoglobin A1C 5.1    Patient has begun to experience low sugar spells. Particularly when he skips a meal.  Blood pressure medicine and blood pressure levels reviewed today with patient. Compliant with blood pressure medicine. States does not miss a dose. No obvious side effects. Blood pressure generally good when checked elsewhere. Watching salt intake.    Patient notes ongoing compliance with antidepressant medication. No obvious side effects. Reports does not miss a dose. Overall continues to help depression substantially. No thoughts of homicide or suicide. Would like to maintain medication.   Ongoing chronic pain in the back and cervical spine. Due to prior old surgical inflammation. States pain medication definitely helps him. No obvious side effects from it.  More active and losing weight after his bariatric surgery    Patient states that he has not had a diabetic eye exam this year.  Cutting wood and slitting wood   Pt felt light headed yest and did not fel good,   Patient has no concerns at this time.      Review of Systems No headache, no major weight loss or weight gain, no chest pain no back pain abdominal pain no change in bowel habits complete ROS otherwise negative     Objective:   Physical Exam  Alert vitals  stable, NAD. Blood pressure good on repeat. HEENT normal. Lungs clear. Heart regular rate and rhythm. Ongoing weight loss noted on exam, blood pressure improved on repeat      Assessment & Plan:  Impression 1 type 2 diabetes contro too tight discussed #2 hypertension discussed compliant with meds #3 depression stable states medications helping will maintain #4 chronic pain ongoing with definite need for medications plan flu shot today. All medications refilled. Decrease metformin to 1 tablet daily. Diet exercise discussed recheck as scheduled

## 2015-11-14 NOTE — Patient Instructions (Signed)

## 2015-11-18 ENCOUNTER — Other Ambulatory Visit: Payer: Self-pay | Admitting: Family Medicine

## 2015-11-18 ENCOUNTER — Telehealth: Payer: Self-pay | Admitting: Family Medicine

## 2015-11-18 MED ORDER — AMLODIPINE BESYLATE 2.5 MG PO TABS: 2.5000 mg | ORAL_TABLET | Freq: Every day | ORAL | 1 refills | Status: DC

## 2015-11-18 NOTE — Telephone Encounter (Signed)
Pt is needing a refill on his amLODipine (NORVASC) 2.5 MG tablet   [facility]

## 2015-11-18 NOTE — Telephone Encounter (Signed)
Patient notified refills sent to pharmacy.  

## 2015-11-27 ENCOUNTER — Other Ambulatory Visit: Payer: Self-pay | Admitting: *Deleted

## 2015-11-27 MED ORDER — LORAZEPAM 2 MG PO TABS: ORAL_TABLET | ORAL | 5 refills | Status: DC

## 2015-11-27 MED ORDER — METFORMIN HCL 500 MG PO TABS: 500.0000 mg | ORAL_TABLET | Freq: Every day | ORAL | 1 refills | Status: DC

## 2015-11-27 MED ORDER — AMLODIPINE BESYLATE 2.5 MG PO TABS: 2.5000 mg | ORAL_TABLET | Freq: Every day | ORAL | 1 refills | Status: DC

## 2015-11-27 MED ORDER — CITALOPRAM HYDROBROMIDE 40 MG PO TABS: 40.0000 mg | ORAL_TABLET | Freq: Every day | ORAL | 1 refills | Status: DC

## 2015-11-27 MED ORDER — HYDROCHLOROTHIAZIDE 25 MG PO TABS: 25.0000 mg | ORAL_TABLET | Freq: Every day | ORAL | 1 refills | Status: DC

## 2015-11-27 MED ORDER — POTASSIUM CHLORIDE CRYS ER 20 MEQ PO TBCR: EXTENDED_RELEASE_TABLET | ORAL | 1 refills | Status: DC

## 2015-11-27 NOTE — Telephone Encounter (Signed)
Ok six ref if time

## 2015-12-12 ENCOUNTER — Other Ambulatory Visit: Payer: Self-pay | Admitting: *Deleted

## 2015-12-12 MED ORDER — LORAZEPAM 2 MG PO TABS: ORAL_TABLET | ORAL | 5 refills | Status: DC

## 2015-12-12 MED ORDER — AMLODIPINE BESYLATE 2.5 MG PO TABS: 2.5000 mg | ORAL_TABLET | Freq: Every day | ORAL | 1 refills | Status: DC

## 2015-12-12 MED ORDER — CITALOPRAM HYDROBROMIDE 40 MG PO TABS: 40.0000 mg | ORAL_TABLET | Freq: Every day | ORAL | 1 refills | Status: DC

## 2015-12-12 MED ORDER — HYDROCHLOROTHIAZIDE 25 MG PO TABS: 25.0000 mg | ORAL_TABLET | Freq: Every day | ORAL | 1 refills | Status: DC

## 2015-12-12 NOTE — Telephone Encounter (Signed)
Ok with five ref 

## 2016-02-14 ENCOUNTER — Encounter: Payer: Self-pay | Admitting: Family Medicine

## 2016-02-14 ENCOUNTER — Ambulatory Visit (INDEPENDENT_AMBULATORY_CARE_PROVIDER_SITE_OTHER): Payer: Medicare HMO | Admitting: Family Medicine

## 2016-02-14 VITALS — BP 134/86 | Ht 66.0 in | Wt 210.0 lb

## 2016-02-14 DIAGNOSIS — E119 Type 2 diabetes mellitus without complications: Secondary | ICD-10-CM | POA: Diagnosis not present

## 2016-02-14 DIAGNOSIS — I10 Essential (primary) hypertension: Secondary | ICD-10-CM | POA: Diagnosis not present

## 2016-02-14 DIAGNOSIS — G8929 Other chronic pain: Secondary | ICD-10-CM

## 2016-02-14 DIAGNOSIS — M5440 Lumbago with sciatica, unspecified side: Secondary | ICD-10-CM | POA: Diagnosis not present

## 2016-02-14 LAB — POCT GLYCOSYLATED HEMOGLOBIN (HGB A1C): Hemoglobin A1C: 5.1

## 2016-02-14 MED ORDER — OXYCODONE-ACETAMINOPHEN 5-325 MG PO TABS: ORAL_TABLET | ORAL | 0 refills | Status: DC

## 2016-02-14 NOTE — Progress Notes (Signed)
   Subjective:    Patient ID: Dennis Mitchell, male    DOB: 06/02/1950, 66 y.o.   MRN: [id number]  Diabetes  He presents for his follow-up diabetic visit. He has type 2 diabetes mellitus. Current diabetic treatments: metformin. He is following a diabetic diet. He participates in exercise daily. Home blood sugar record trend: 100. He does not see a podiatrist.Eye exam is not current (goes next week).  A1C 5.1. Results for orders placed or performed in visit on 02/14/16  POCT glycosylated hemoglobin (Hb A1C)  Result Value Ref Range   Hemoglobin A1C 5.1    Blood pressure medicine and blood pressure levels reviewed today with patient. Compliant with blood pressure medicine. States does not miss a dose. No obvious side effects. Blood pressure generally good when checked elsewhere. Watching salt intake.   Patient continues to take lipid medication regularly. No obvious side effects from it. Generally does not miss a dose. Prior blood work results are reviewed with patient. Patient continues to work on fat intake in diet  Notes a vbit of lightheadedness t times,  Pt states no concerns today.   Patient notes ongoing use of chronic pain medicine. States deftly needs it. Definitely helps his symptomatology. No obvious side effects. Review of Systems No headache, no major weight loss or weight gain, no chest pain no back pain abdominal pain no change in bowel habits complete ROS otherwise negative     Objective:   Physical Exam  Alert vitals stable, NAD. Blood pressure good on repeat. HEENT normal. Lungs clear. Heart regular rate and rhythm. Ongoing weight loss post-bariatric surgery. Ankles chronic venous stasis changes. Distal sensation and pulses intact      Assessment & Plan:  Impression type 2 diabetes controlled to tight discuss. Medication him 2 hypertension good control discussed maintain same measurement 3 chronic pain ongoing need for meds meds discussed refilled #4 depression  clinically stable patient maintain same meds prior blood work reviewed and discussed meds to continue diet exercise discussed recheck in several months

## 2016-03-16 DIAGNOSIS — T1511XA Foreign body in conjunctival sac, right eye, initial encounter: Secondary | ICD-10-CM | POA: Diagnosis not present

## 2016-04-22 DIAGNOSIS — K429 Umbilical hernia without obstruction or gangrene: Secondary | ICD-10-CM | POA: Diagnosis not present

## 2016-04-22 DIAGNOSIS — Z006 Encounter for examination for normal comparison and control in clinical research program: Secondary | ICD-10-CM | POA: Diagnosis not present

## 2016-04-22 DIAGNOSIS — Z903 Acquired absence of stomach [part of]: Secondary | ICD-10-CM | POA: Diagnosis not present

## 2016-05-08 ENCOUNTER — Other Ambulatory Visit: Payer: Self-pay | Admitting: Family Medicine

## 2016-05-11 NOTE — Telephone Encounter (Signed)
Last seen 02/14/2016

## 2016-05-12 ENCOUNTER — Other Ambulatory Visit: Payer: Self-pay | Admitting: *Deleted

## 2016-05-14 ENCOUNTER — Encounter: Payer: Self-pay | Admitting: Family Medicine

## 2016-05-14 ENCOUNTER — Ambulatory Visit (INDEPENDENT_AMBULATORY_CARE_PROVIDER_SITE_OTHER): Payer: Medicare HMO | Admitting: Family Medicine

## 2016-05-14 VITALS — BP 132/82 | Ht 66.0 in | Wt 204.6 lb

## 2016-05-14 DIAGNOSIS — I878 Other specified disorders of veins: Secondary | ICD-10-CM | POA: Diagnosis not present

## 2016-05-14 DIAGNOSIS — G8929 Other chronic pain: Secondary | ICD-10-CM

## 2016-05-14 DIAGNOSIS — F321 Major depressive disorder, single episode, moderate: Secondary | ICD-10-CM | POA: Diagnosis not present

## 2016-05-14 DIAGNOSIS — I10 Essential (primary) hypertension: Secondary | ICD-10-CM | POA: Diagnosis not present

## 2016-05-14 DIAGNOSIS — E7800 Pure hypercholesterolemia, unspecified: Secondary | ICD-10-CM | POA: Diagnosis not present

## 2016-05-14 DIAGNOSIS — Z0000 Encounter for general adult medical examination without abnormal findings: Secondary | ICD-10-CM | POA: Diagnosis not present

## 2016-05-14 DIAGNOSIS — M5440 Lumbago with sciatica, unspecified side: Secondary | ICD-10-CM

## 2016-05-14 DIAGNOSIS — E119 Type 2 diabetes mellitus without complications: Secondary | ICD-10-CM | POA: Diagnosis not present

## 2016-05-14 LAB — POCT GLYCOSYLATED HEMOGLOBIN (HGB A1C): HEMOGLOBIN A1C: 5.2

## 2016-05-14 MED ORDER — OXYCODONE-ACETAMINOPHEN 5-325 MG PO TABS: ORAL_TABLET | ORAL | 0 refills | Status: DC

## 2016-05-14 MED ORDER — CYCLOBENZAPRINE HCL 10 MG PO TABS: ORAL_TABLET | ORAL | 3 refills | Status: DC

## 2016-05-14 MED ORDER — MONTELUKAST SODIUM 10 MG PO TABS: 10.0000 mg | ORAL_TABLET | Freq: Every day | ORAL | 3 refills | Status: DC

## 2016-05-14 NOTE — Progress Notes (Signed)
Subjective:    Patient ID: Dennis Mitchell, male    DOB: 04-25-1950, 66 y.o.   MRN: [id number]  HPI The patient comes in today for a wellness visit.  Plus numerous chronic concerns  A review of their health history was completed.  A review of medications was also completed.  Any needed refills; yes  Eating habits: eating healthy  Falls/  MVA accidents in past few months: none  Regular exercise: yes  Specialist pt sees on regular basis: none  Preventative health issues were discussed.   Additional concerns: diabetic check up  Colon 2015, told to do again in five yrs    Results for orders placed or performed in visit on 02/14/16  POCT glycosylated hemoglobin (Hb A1C)  Result Value Ref Range   Hemoglobin A1C 5.1    Exercising  Lot staying active  Has not done eye doc visit yet  Energy level overall sig better  Patient compliant with pain medication. Continues to experience the pain which led to initiation of analgesic intervention. No significant negative side effects. States definitely needs the pain medication to maintain current level of functioning. Does not receive controlled substance pain medication elsewhere.  depression Patient notes ongoing compliance with antidepressant medication. No obvious side effects. Reports does not miss a dose. Overall continues to help depression substantially. No thoughts of homicide or suicide. Would like to maintain medication.  Patient claims compliance with diabetes medication. No obvious side effects. Reports no substantial low sugar spells. Most numbers are generally in good range when checked fasting. Generally does not miss a dose of medication. Watching diabetic diet closely  Blood pressure medicine and blood pressure levels reviewed today with patient. Compliant with blood pressure medicine. States does not miss a dose. No obvious side effects. Blood pressure generally good when checked elsewhere. Watching salt  intake.     Review of Systems  Constitutional: Negative for activity change, appetite change and fever.  HENT: Negative for congestion and rhinorrhea.   Eyes: Negative for discharge.  Respiratory: Negative for cough and wheezing.   Cardiovascular: Negative for chest pain.  Gastrointestinal: Negative for abdominal pain, blood in stool and vomiting.  Genitourinary: Negative for difficulty urinating and frequency.  Musculoskeletal: Negative for neck pain.  Skin: Negative for rash.  Allergic/Immunologic: Negative for environmental allergies and food allergies.  Neurological: Negative for weakness and headaches.  Psychiatric/Behavioral: Negative for agitation.  All other systems reviewed and are negative.      Objective:   Physical Exam  Constitutional: He appears well-developed and well-nourished.  Obesity present but considerably improved post bariatric surgery  HENT:  Head: Normocephalic and atraumatic.  Right Ear: External ear normal.  Left Ear: External ear normal.  Nose: Nose normal.  Mouth/Throat: Oropharynx is clear and moist.  Eyes: EOM are normal. Pupils are equal, round, and reactive to light.  Neck: Normal range of motion. Neck supple. No thyromegaly present.  Cardiovascular: Normal rate, regular rhythm and normal heart sounds.   No murmur heard. Pulmonary/Chest: Effort normal and breath sounds normal. No respiratory distress. He has no wheezes.  Abdominal: Soft. Bowel sounds are normal. He exhibits no distension and no mass. There is no tenderness.  Genitourinary: Penis normal.  Musculoskeletal: Normal range of motion. He exhibits no edema.  Lymphadenopathy:    He has no cervical adenopathy.  Neurological: He is alert. He exhibits normal muscle tone.  Skin: Skin is warm and dry. No erythema.  Positive venous stasis changes  Psychiatric: He has a  normal mood and affect. His behavior is normal. Judgment normal.  Vitals reviewed.         Assessment & Plan:   Impression 1 wellness exam. Up to date on vaccines. Up-to-date on colonoscopy. Diet exercise discussed. Weight discussed. #2 type 2 diabetes control excellent off medication discussed #3 depression clinically stable patient wishes stance same medicines will maintain #4 hypertension good control discussed maintain same meds compliance discussed #5 chronic venous stasis discussed #6 chronic pain medicine. Now down to 1 tablet per day discussed with just medication.  Patient compliant with pain medication. Continues to experience the pain which led to initiation of analgesic intervention. No significant negative side effects. States definitely needs the pain medication to maintain current level of functioning. Does not receive controlled substance pain medication elsewhere.

## 2016-05-18 ENCOUNTER — Other Ambulatory Visit: Payer: Self-pay | Admitting: Family Medicine

## 2016-05-22 ENCOUNTER — Other Ambulatory Visit: Payer: Self-pay | Admitting: Pharmacy Technician

## 2016-05-22 NOTE — Patient Outreach (Signed)
[facility]) Care Management  05/22/2016  Dennis Mitchell 1950-11-04 [id number]   Spoke to patient about Metformin medication adherence. Patient stated that Dr. Wolfgang Phoenix recently discontinued the medication due to A1C  Maud Deed. [place], [place] [facility] [phone]

## 2016-05-25 ENCOUNTER — Telehealth: Payer: Self-pay | Admitting: Family Medicine

## 2016-05-25 NOTE — Telephone Encounter (Signed)
Received Prior- Authorization Approval for Cyclobenzaprine 10 MG Tablet. Authorization good until 05/23/2018.

## 2016-06-06 ENCOUNTER — Other Ambulatory Visit: Payer: Self-pay | Admitting: Family Medicine

## 2016-06-17 ENCOUNTER — Other Ambulatory Visit: Payer: Self-pay | Admitting: Family Medicine

## 2016-08-18 ENCOUNTER — Encounter: Payer: Self-pay | Admitting: Family Medicine

## 2016-08-18 ENCOUNTER — Ambulatory Visit (INDEPENDENT_AMBULATORY_CARE_PROVIDER_SITE_OTHER): Payer: Medicare HMO | Admitting: Family Medicine

## 2016-08-18 VITALS — BP 138/78 | Ht 66.0 in | Wt 208.0 lb

## 2016-08-18 DIAGNOSIS — G8929 Other chronic pain: Secondary | ICD-10-CM

## 2016-08-18 DIAGNOSIS — E119 Type 2 diabetes mellitus without complications: Secondary | ICD-10-CM | POA: Diagnosis not present

## 2016-08-18 DIAGNOSIS — F321 Major depressive disorder, single episode, moderate: Secondary | ICD-10-CM

## 2016-08-18 DIAGNOSIS — F1190 Opioid use, unspecified, uncomplicated: Secondary | ICD-10-CM

## 2016-08-18 DIAGNOSIS — F5101 Primary insomnia: Secondary | ICD-10-CM | POA: Diagnosis not present

## 2016-08-18 DIAGNOSIS — M5440 Lumbago with sciatica, unspecified side: Secondary | ICD-10-CM

## 2016-08-18 LAB — POCT GLYCOSYLATED HEMOGLOBIN (HGB A1C): Hemoglobin A1C: 5

## 2016-08-18 MED ORDER — OXYCODONE-ACETAMINOPHEN 5-325 MG PO TABS: ORAL_TABLET | ORAL | 0 refills | Status: DC

## 2016-08-18 MED ORDER — LORAZEPAM 2 MG PO TABS: ORAL_TABLET | ORAL | 2 refills | Status: DC

## 2016-08-18 NOTE — Progress Notes (Signed)
   Subjective:    Patient ID: Dennis Mitchell, male    DOB: 15-Mar-1950, 66 y.o.   MRN: [id number] Patient arrives to the office for evaluation of multiple concerns HPI This patient was seen today for chronic pain  The medication list was reviewed and updated.   -Compliance with medication: Yes  - Number patient states they take daily: one -two daily -when was the last dose patient took? This am. The patient was advised the importance of maintaining medication and not using illegal substances with these.  Refills needed: No states he still has five rx left,from the last two visits.  The patient was educated that we can provide 3 monthly scripts for their medication, it is their responsibility to follow the instructions.  Side effects or complications from medications: None  Patient is aware that pain medications are meant to minimize the severity of the pain to allow their pain levels to improve to allow for better function. They are aware of that pain medications cannot totally remove their pain.  Due for UDT ( at least once per year) : due today   Results for orders placed or performed in visit on 08/18/16  POCT glycosylated hemoglobin (Hb A1C)  Result Value Ref Range   Hemoglobin A1C 5.0      Trying to get by once per day with the pain med, without it has a rough day   Patient claims compliance with diabetes medication. No obvious side effects. Reports no substantial low sugar spells. Most numbers are generally in good range when checked fasting. Generally does not miss a dose of medication. Watching diabetic diet closely  Blood pressure medicine and blood pressure levels reviewed today with patient. Compliant with blood pressure medicine. States does not miss a dose. No obvious side effects. Blood pressure generally good when checked elsewhere. Watching salt intake.   Patient notes ongoing compliance with antidepressant medication. No obvious side effects. Reports does not  miss a dose. Overall continues to help depression substantially. No thoughts of homicide or suicide. Would like to maintain medication.    Review of Systems No headache, no major weight loss or weight gain, no chest pain no back pain abdominal pain no change in bowel habits complete ROS otherwise negative     Objective:   Physical Exam  Alert and oriented, vitals reviewed and stable, NAD ENT-TM's and ext canals WNL bilat via otoscopic exam Soft palate, tonsils and post pharynx WNL via oropharyngeal exam Neck-symmetric, no masses; thyroid nonpalpable and nontender Pulmonary-no tachypnea or accessory muscle use; Clear without wheezes via auscultation Card--no abnrml murmurs, rhythm reg and rate WNL Carotid pulses symmetric, without bruits       Assessment & Plan:  Impression 1 type 2 diabetes. Excellent control discussed maintain same diet and exercise #2 hypertension good control discussed maintain same #3 depression clinically stable discussed maintain same meds #4 chronic pain ongoing discussed will prescribe 2  Impression: Chronic pain. Patient compliant with medication. No substantial side effects. [place] controlled substance registry reviewed to ensure compliance and proper use of medication. Patient aware goal of medicine is not complete resolution of pain but to control his symptoms to improve his functional capacity. Aware of potential adverse side effects  Follow-up in several months as scheduled

## 2016-08-21 ENCOUNTER — Other Ambulatory Visit: Payer: Self-pay | Admitting: *Deleted

## 2016-08-21 MED ORDER — HYDROCHLOROTHIAZIDE 25 MG PO TABS: 25.0000 mg | ORAL_TABLET | Freq: Every day | ORAL | 1 refills | Status: DC

## 2016-08-22 LAB — TOXASSURE SELECT 13 (MW), URINE

## 2016-10-05 ENCOUNTER — Other Ambulatory Visit: Payer: Self-pay | Admitting: Family Medicine

## 2016-11-09 ENCOUNTER — Other Ambulatory Visit: Payer: Self-pay | Admitting: Family Medicine

## 2016-11-13 DIAGNOSIS — Z903 Acquired absence of stomach [part of]: Secondary | ICD-10-CM | POA: Diagnosis not present

## 2016-11-13 DIAGNOSIS — K429 Umbilical hernia without obstruction or gangrene: Secondary | ICD-10-CM | POA: Diagnosis not present

## 2016-11-13 DIAGNOSIS — Z006 Encounter for examination for normal comparison and control in clinical research program: Secondary | ICD-10-CM | POA: Diagnosis not present

## 2016-11-18 ENCOUNTER — Encounter: Payer: Self-pay | Admitting: Family Medicine

## 2016-11-18 ENCOUNTER — Ambulatory Visit (INDEPENDENT_AMBULATORY_CARE_PROVIDER_SITE_OTHER): Payer: Medicare HMO | Admitting: Family Medicine

## 2016-11-18 VITALS — BP 126/84 | Ht 66.0 in | Wt 218.1 lb

## 2016-11-18 DIAGNOSIS — Z23 Encounter for immunization: Secondary | ICD-10-CM | POA: Diagnosis not present

## 2016-11-18 DIAGNOSIS — F5101 Primary insomnia: Secondary | ICD-10-CM

## 2016-11-18 DIAGNOSIS — G8929 Other chronic pain: Secondary | ICD-10-CM

## 2016-11-18 DIAGNOSIS — M5440 Lumbago with sciatica, unspecified side: Secondary | ICD-10-CM

## 2016-11-18 DIAGNOSIS — E118 Type 2 diabetes mellitus with unspecified complications: Secondary | ICD-10-CM

## 2016-11-18 DIAGNOSIS — I10 Essential (primary) hypertension: Secondary | ICD-10-CM

## 2016-11-18 DIAGNOSIS — Z125 Encounter for screening for malignant neoplasm of prostate: Secondary | ICD-10-CM | POA: Diagnosis not present

## 2016-11-18 LAB — POCT GLYCOSYLATED HEMOGLOBIN (HGB A1C): Hemoglobin A1C: 4.6

## 2016-11-18 MED ORDER — OXYCODONE-ACETAMINOPHEN 5-325 MG PO TABS: ORAL_TABLET | ORAL | 0 refills | Status: DC

## 2016-11-18 MED ORDER — MECLIZINE HCL 25 MG PO TABS: ORAL_TABLET | ORAL | 1 refills | Status: DC

## 2016-11-18 NOTE — Progress Notes (Signed)
   Subjective:    Patient ID: Dennis Mitchell, male    DOB: 10/27/1950, 66 y.o.   MRN: [id number]  Diabetes  He presents for his follow-up diabetic visit. He has type 2 diabetes mellitus. He has had a previous visit with a dietitian. He does not see a podiatrist.Eye exam is not current.    This patient was seen today for chronic pain  The medication list was reviewed and updated.   -Compliance with medication: Takes daily   - Number patient states they take daily: Takes 1-2 daily. Most days only 1.   -when was the last dose patient took? Last dose this morning.  The patient was advised the importance of maintaining medication and not using illegal substances with these.  Refills needed: Yes   The patient was educated that we can provide 3 monthly scripts for their medication, it is their responsibility to follow the instructions.  Side effects or complications from medications: None   Patient is aware that pain medications are meant to minimize the severity of the pain to allow their pain levels to improve to allow for better function. They are aware of that pain medications cannot totally remove their pain.  Due for UDT ( at least once per year) : 08/2017  Patient claims compliance with diabetes medication. No obvious side effects. Reports no substantial low sugar spells. Most numbers are generally in good range when checked fasting. Generally does not miss a dose of medication. Watching diabetic diet closely  Blood pressure medicine and blood pressure levels reviewed today with patient. Compliant with blood pressure medicine. States does not miss a dose. No obvious side effects. Blood pressure generally good when checked elsewhere. Watching salt intake.      Patient would like a refill on Antivert.  Patient states average is 1 states it definitely helps him.  Results for orders placed or performed in visit on 11/18/16  POCT HgB A1C  Result Value Ref Range   Hemoglobin A1C  4.6       Review of Systems No headache, no major weight loss or weight gain, no chest pain no back pain abdominal pain no change in bowel habits complete ROS otherwise negative     Objective:   Physical Exam  Alert and oriented, vitals reviewed and stable, NAD ENT-TM's and ext canals WNL bilat via otoscopic exam Soft palate, tonsils and post pharynx WNL via oropharyngeal exam Neck-symmetric, no masses; thyroid nonpalpable and nontender Pulmonary-no tachypnea or accessory muscle use; Clear without wheezes via auscultation Card--no abnrml murmurs, rhythm reg and rate WNL Carotid pulses symmetric, without bruits       Assessment & Plan:  Impression: Chronic pain. Patient compliant with medication. No substantial side effects. [place] controlled substance registry reviewed to ensure compliance and proper use of medication. Patient aware goal of medicine is not complete resolution of pain but to control his symptoms to improve his functional capacity. Aware of potential adverse side effects  Hypertension good control discussed to maintain same meds #3 type 2 diabetes.  A1c excellent.  Diet exercise discussed.  Same approach.  Appropriate blood work.  Flu shot today.  Medications were

## 2016-11-19 LAB — PSA: Prostate Specific Ag, Serum: 0.5 ng/mL (ref 0.0–4.0)

## 2016-11-19 LAB — BASIC METABOLIC PANEL
BUN / CREAT RATIO: 15 (ref 10–24)
BUN: 17 mg/dL (ref 8–27)
CO2: 30 mmol/L — ABNORMAL HIGH (ref 20–29)
CREATININE: 1.11 mg/dL (ref 0.76–1.27)
Calcium: 9 mg/dL (ref 8.6–10.2)
Chloride: 95 mmol/L — ABNORMAL LOW (ref 96–106)
GFR calc Af Amer: 80 mL/min/{1.73_m2} (ref >59)
GFR, EST NON AFRICAN AMERICAN: 69 mL/min/{1.73_m2} (ref >59)
GLUCOSE: 96 mg/dL (ref 65–99)
Potassium: 3.7 mmol/L (ref 3.5–5.2)
SODIUM: 135 mmol/L (ref 134–144)

## 2016-11-19 LAB — HEPATIC FUNCTION PANEL
ALT: 12 IU/L (ref 0–44)
AST: 17 IU/L (ref 0–40)
Albumin: 4 g/dL (ref 3.6–4.8)
Alkaline Phosphatase: 68 IU/L (ref 39–117)
BILIRUBIN TOTAL: 0.6 mg/dL (ref 0.0–1.2)
Bilirubin, Direct: 0.17 mg/dL (ref 0.00–0.40)
TOTAL PROTEIN: 6.5 g/dL (ref 6.0–8.5)

## 2016-11-19 LAB — MICROALBUMIN / CREATININE URINE RATIO
CREATININE, UR: 39.3 mg/dL
MICROALBUM., U, RANDOM: 13.9 ug/mL
Microalb/Creat Ratio: 35.4 mg/g creat — ABNORMAL HIGH (ref 0.0–30.0)

## 2016-11-19 LAB — LIPID PANEL
CHOLESTEROL TOTAL: 164 mg/dL (ref 100–199)
Chol/HDL Ratio: 3 ratio (ref 0.0–5.0)
HDL: 55 mg/dL (ref >39)
LDL CALC: 98 mg/dL (ref 0–99)
Triglycerides: 53 mg/dL (ref 0–149)
VLDL CHOLESTEROL CAL: 11 mg/dL (ref 5–40)

## 2016-11-22 ENCOUNTER — Encounter: Payer: Self-pay | Admitting: Family Medicine

## 2016-12-22 ENCOUNTER — Other Ambulatory Visit: Payer: Self-pay | Admitting: Family Medicine

## 2016-12-22 NOTE — Telephone Encounter (Signed)
Ok six month

## 2017-02-04 DIAGNOSIS — K420 Umbilical hernia with obstruction, without gangrene: Secondary | ICD-10-CM | POA: Diagnosis not present

## 2017-02-18 ENCOUNTER — Ambulatory Visit (INDEPENDENT_AMBULATORY_CARE_PROVIDER_SITE_OTHER): Payer: Medicare HMO | Admitting: Family Medicine

## 2017-02-18 ENCOUNTER — Encounter: Payer: Self-pay | Admitting: Family Medicine

## 2017-02-18 VITALS — BP 134/86 | Ht 66.0 in | Wt 225.8 lb

## 2017-02-18 DIAGNOSIS — I10 Essential (primary) hypertension: Secondary | ICD-10-CM | POA: Diagnosis not present

## 2017-02-18 DIAGNOSIS — E118 Type 2 diabetes mellitus with unspecified complications: Secondary | ICD-10-CM | POA: Diagnosis not present

## 2017-02-18 DIAGNOSIS — M5440 Lumbago with sciatica, unspecified side: Secondary | ICD-10-CM | POA: Diagnosis not present

## 2017-02-18 DIAGNOSIS — G8929 Other chronic pain: Secondary | ICD-10-CM | POA: Diagnosis not present

## 2017-02-18 DIAGNOSIS — F5101 Primary insomnia: Secondary | ICD-10-CM | POA: Diagnosis not present

## 2017-02-18 MED ORDER — MONTELUKAST SODIUM 10 MG PO TABS: 10.0000 mg | ORAL_TABLET | Freq: Every day | ORAL | 5 refills | Status: DC

## 2017-02-18 MED ORDER — LORAZEPAM 2 MG PO TABS: ORAL_TABLET | ORAL | 5 refills | Status: DC

## 2017-02-18 MED ORDER — POTASSIUM CHLORIDE CRYS ER 20 MEQ PO TBCR: EXTENDED_RELEASE_TABLET | ORAL | 5 refills | Status: DC

## 2017-02-18 MED ORDER — CYCLOBENZAPRINE HCL 10 MG PO TABS: ORAL_TABLET | ORAL | 5 refills | Status: DC

## 2017-02-18 MED ORDER — AMLODIPINE BESYLATE 2.5 MG PO TABS: 2.5000 mg | ORAL_TABLET | Freq: Every day | ORAL | 5 refills | Status: DC

## 2017-02-18 MED ORDER — OXYCODONE-ACETAMINOPHEN 5-325 MG PO TABS: ORAL_TABLET | ORAL | 0 refills | Status: DC

## 2017-02-18 MED ORDER — MECLIZINE HCL 25 MG PO TABS: ORAL_TABLET | ORAL | 5 refills | Status: DC

## 2017-02-18 MED ORDER — HYDROCHLOROTHIAZIDE 25 MG PO TABS: 25.0000 mg | ORAL_TABLET | Freq: Every day | ORAL | 5 refills | Status: DC

## 2017-02-18 MED ORDER — HYDROXYZINE PAMOATE 25 MG PO CAPS: ORAL_CAPSULE | ORAL | 5 refills | Status: DC

## 2017-02-18 MED ORDER — CITALOPRAM HYDROBROMIDE 40 MG PO TABS: 40.0000 mg | ORAL_TABLET | Freq: Every day | ORAL | 5 refills | Status: DC

## 2017-02-18 MED ORDER — SILDENAFIL CITRATE 20 MG PO TABS: ORAL_TABLET | ORAL | 11 refills | Status: DC

## 2017-02-18 MED ORDER — DICLOFENAC SODIUM 1 % TD GEL: TRANSDERMAL | 11 refills | Status: AC

## 2017-02-18 MED ORDER — FLUOROURACIL 5 % EX CREA: 1.0000 "application " | TOPICAL_CREAM | Freq: Every day | CUTANEOUS | 6 refills | Status: DC | PRN

## 2017-02-18 NOTE — Progress Notes (Signed)
   Subjective:    Patient ID: Dennis Mitchell, male    DOB: [birth year], 67 y.o.   MRN: [id number]  Diabetes  He presents for his follow-up diabetic visit. He has type 2 diabetes mellitus.  Pt stated he had abdominal hernia surgery about 2 weeks ago; he is going to get an eye exam and is going to see podiatrist.    Patient claims compliance with diabetes medication. No obvious side effects. Reports no substantial low sugar spells. Most numbers are generally in good range when checked fasting. Generally does not miss a dose of medication. Watching diabetic diet closely  Blood pressure medicine and blood pressure levels reviewed today with patient. Compliant with blood pressure medicine. States does not miss a dose. No obvious side effects. Blood pressure generally good when checked elsewhere. Watching salt intake.  Patient compliant with pain medication. Continues to experience the pain which led to initiation of analgesic intervention. No significant negative side effects. States definitely needs the pain medication to maintain current level of functioning. Does not receive controlled substance pain medication elsewhere.    Patient notes ongoing compliance with antidepressant medication. No obvious side effects. Reports does not miss a dose. Overall continues to help depression substantially. No thoughts of homicide or suicide. Would like to maintain medication.  Patient status post abdominal hernia surgery.  Reports overall feeling quite a bit better this regard. Review of Systems No headache, no major weight loss or weight gain, no chest pain no back pain abdominal pain no change in bowel habits complete ROS otherwise negative     Objective:   Physical Exam Alert and oriented, vitals reviewed and stable, NAD ENT-TM's and ext canals WNL bilat via otoscopic exam Soft palate, tonsils and post pharynx WNL via oropharyngeal exam Neck-symmetric, no masses; thyroid nonpalpable and  nontender Pulmonary-no tachypnea or accessory muscle use; Clear without wheezes via auscultation Card--no abnrml murmurs, rhythm reg and rate WNL Carotid pulses symmetric, without bruits Low back pain for percussion feet sensation intact chronic venous stasis changes evident       Assessment & Plan:  Impression: Chronic pain. Patient compliant with medication. No substantial side effects. [place] controlled substance registry reviewed to ensure compliance and proper use of medication. Patient aware goal of medicine is not complete resolution of pain but to control his symptoms to improve his functional capacity. Aware of potential adverse side effects  Hypertension good control discussed to maintain same meds  Type 2 diabetes good control prior blood work reviewed.  A1c excellent diet discussed compliance discussed with diet  Chronic depression clinically stable to maintain same meds follow-up as scheduled

## 2017-02-19 ENCOUNTER — Encounter: Payer: Self-pay | Admitting: Family Medicine

## 2017-02-19 ENCOUNTER — Telehealth: Payer: Self-pay | Admitting: Family Medicine

## 2017-02-19 NOTE — Telephone Encounter (Signed)
Rx prior auth DENIED for pt's Sildenafil through his [employer] coverage His plan does not cover this medication for erectile dysfunction Mailed letter to pt explaining & gave info flyer for [facility] to get cheaper out of pocket cost

## 2017-02-20 ENCOUNTER — Other Ambulatory Visit: Payer: Self-pay | Admitting: Family Medicine

## 2017-02-23 ENCOUNTER — Telehealth: Payer: Self-pay | Admitting: Family Medicine

## 2017-02-23 NOTE — Telephone Encounter (Signed)
Rx prior auth APPROVED for pt's meclizine (ANTIVERT) 25 MG tablet  Valid 02/19/17 - 02/19/2019 through [place] approval to [facility], sent to be scanned & filed

## 2017-03-30 ENCOUNTER — Other Ambulatory Visit: Payer: Self-pay | Admitting: Family Medicine

## 2017-05-17 ENCOUNTER — Encounter: Payer: Medicare HMO | Admitting: Family Medicine

## 2017-05-21 ENCOUNTER — Other Ambulatory Visit: Payer: Self-pay | Admitting: Family Medicine

## 2017-05-24 ENCOUNTER — Other Ambulatory Visit: Payer: Self-pay | Admitting: *Deleted

## 2017-05-24 ENCOUNTER — Telehealth: Payer: Self-pay | Admitting: *Deleted

## 2017-05-24 MED ORDER — HYDROCHLOROTHIAZIDE 25 MG PO TABS: 25.0000 mg | ORAL_TABLET | Freq: Every day | ORAL | 0 refills | Status: DC

## 2017-05-24 NOTE — Telephone Encounter (Signed)
Patient requesting hydrochlorothiazide 25mg  to be refilled to [facility] in [place]. Patient requested a week to 10 day refill. Please advise

## 2017-05-24 NOTE — Telephone Encounter (Signed)
7 day supply sent to pharm. Pt is waiting on mail order. Pt notified.

## 2017-05-26 ENCOUNTER — Ambulatory Visit (INDEPENDENT_AMBULATORY_CARE_PROVIDER_SITE_OTHER): Payer: Medicare HMO | Admitting: Family Medicine

## 2017-05-26 ENCOUNTER — Encounter: Payer: Self-pay | Admitting: Family Medicine

## 2017-05-26 VITALS — BP 118/80 | Ht 66.0 in | Wt 231.0 lb

## 2017-05-26 DIAGNOSIS — F321 Major depressive disorder, single episode, moderate: Secondary | ICD-10-CM

## 2017-05-26 DIAGNOSIS — G4733 Obstructive sleep apnea (adult) (pediatric): Secondary | ICD-10-CM

## 2017-05-26 DIAGNOSIS — Z0000 Encounter for general adult medical examination without abnormal findings: Secondary | ICD-10-CM

## 2017-05-26 DIAGNOSIS — E1100 Type 2 diabetes mellitus with hyperosmolarity without nonketotic hyperglycemic-hyperosmolar coma (NKHHC): Secondary | ICD-10-CM

## 2017-05-26 DIAGNOSIS — E118 Type 2 diabetes mellitus with unspecified complications: Secondary | ICD-10-CM | POA: Diagnosis not present

## 2017-05-26 DIAGNOSIS — I10 Essential (primary) hypertension: Secondary | ICD-10-CM | POA: Diagnosis not present

## 2017-05-26 DIAGNOSIS — G8929 Other chronic pain: Secondary | ICD-10-CM

## 2017-05-26 DIAGNOSIS — M5440 Lumbago with sciatica, unspecified side: Secondary | ICD-10-CM | POA: Diagnosis not present

## 2017-05-26 LAB — POCT GLYCOSYLATED HEMOGLOBIN (HGB A1C): HEMOGLOBIN A1C: 5.3

## 2017-05-26 MED ORDER — OXYCODONE-ACETAMINOPHEN 5-325 MG PO TABS: ORAL_TABLET | ORAL | 0 refills | Status: DC

## 2017-05-26 MED ORDER — ZOSTER VAC RECOMB ADJUVANTED 50 MCG/0.5ML IM SUSR: 0.5000 mL | Freq: Once | INTRAMUSCULAR | 1 refills | Status: AC

## 2017-05-26 NOTE — Progress Notes (Addendum)
Subjective:    Patient ID: Dennis Mitchell, male    DOB: 03/15/1950, 67 y.o.   MRN: [id number]  HPI AWV- Annual Wellness Visit  The patient was seen for their annual wellness visit. The patient's past medical history, surgical history, and family history were reviewed. Pertinent vaccines were reviewed ( tetanus, pneumonia, shingles, flu) The patient's medication list was reviewed and updated.  The height and weight were entered.  BMI recorded in electronic record elsewhere  Cognitive screening was completed. Outcome of Mini - Cog: passed   Falls /depression screening electronically recorded within record elsewhere  Current tobacco usage:ecig (All patients who use tobacco were given written and verbal information on quitting)  Recent listing of emergency department/hospitalizations over the past year were reviewed.  current specialist the patient sees on a regular basis: Dermatologist   Medicare annual wellness visit patient questionnaire was reviewed.  A written screening schedule for the patient for the next 5-10 years was given. Appropriate discussion of followup regarding next visit was discussed.  Patient claims compliance with diabetes medication. No obvious side effects. Reports no substantial low sugar spells. Most numbers are generally in good range when checked fasting. Generally does not miss a dose of medication. Watching diabetic diet closely  Blood pressure medicine and blood pressure levels reviewed today with patient. Compliant with blood pressure medicine. States does not miss a dose. No obvious side effects. Blood pressure generally good when checked elsewhere. Watching salt intake.   Patient notes ongoing compliance with antidepressant medication. No obvious side effects. Reports does not miss a dose. Overall continues to help depression substantially. No thoughts of homicide or suicide. Would like to maintain medication.  Staying active with rental  properties    Last eye doc visit yrs ago    Patient also here for chronic health issues he also gets percocet from [place] for chronic pain.He states he takes as prescribed one per day.Toxissure due 08/2017  Patient compliant with pain medication. Continues to experience the pain which led to initiation of analgesic intervention. No significant negative side effects. States definitely needs the pain medication to maintain current level of functioning. Does not receive controlled substance pain medication elsewhere.  One extra pain tab may two or three times per wk     Review of Systems  Constitutional: Negative for activity change, appetite change and fever.  HENT: Negative for congestion and rhinorrhea.   Eyes: Negative for discharge.  Respiratory: Negative for cough and wheezing.   Cardiovascular: Negative for chest pain.  Gastrointestinal: Negative for abdominal pain, blood in stool and vomiting.  Genitourinary: Negative for difficulty urinating and frequency.  Musculoskeletal: Negative for neck pain.  Skin: Negative for rash.  Allergic/Immunologic: Negative for environmental allergies and food allergies.  Neurological: Negative for weakness and headaches.  Psychiatric/Behavioral: Negative for agitation.  All other systems reviewed and are negative.      Results for orders placed or performed in visit on 05/26/17  POCT glycosylated hemoglobin (Hb A1C)  Result Value Ref Range   Hemoglobin A1C 5.3    PhQ 9 filled out also.  Objective:   Physical Exam  Constitutional: He appears well-developed and well-nourished.  HENT:  Head: Normocephalic and atraumatic.  Right Ear: External ear normal.  Left Ear: External ear normal.  Nose: Nose normal.  Mouth/Throat: Oropharynx is clear and moist.  Eyes: Right eye exhibits no discharge. Left eye exhibits no discharge. No scleral icterus.  Neck: Normal range of motion. Neck supple. No thyromegaly present.  Cardiovascular: Normal rate,  regular rhythm and normal heart sounds.  No murmur heard. Pulmonary/Chest: Effort normal and breath sounds normal. No respiratory distress. He has no wheezes.  Abdominal: Soft. Bowel sounds are normal. He exhibits no distension and no mass. There is no tenderness.  Genitourinary: Prostate normal and penis normal.  Musculoskeletal: Normal range of motion. He exhibits no edema.  Lymphadenopathy:    He has no cervical adenopathy.  Neurological: He is alert. He exhibits normal muscle tone. Coordination normal.  Skin: Skin is warm and dry. No erythema.  Psychiatric: He has a normal mood and affect. His behavior is normal. Judgment normal.  Vitals reviewed.         Assessment & Plan:  1 impression wellness exam.  Diet discussed.  Exercise discussed.  Immunizations reviewed.  Up-to-date on colonoscopy.  #2 type 2 diabetes.  Control excellent.  A1c good  3.  Depression clinically stable.  PHQ 9 filled out.  To maintain same medication  4.  Hypertension.  Controlled.  Compliance of meds discussed medications maintain  5.  Sleep apnea.  Patient has long-standing history of obstructive sleep apnea.  He uses his CPAP faithfully.  Patient notes when he uses it it helps him sleep better.  With less snoring.  Awakes more restful.  Notes that his blood pressure is lower.  Definitely benefits from  Prescription written for tube masking and new machine.  Importance of use discussed and encouraged.  Follow-up in 3 months medications and diet exercise discussed further management as noted above

## 2017-06-11 ENCOUNTER — Telehealth: Payer: Self-pay | Admitting: Family Medicine

## 2017-06-11 NOTE — Telephone Encounter (Signed)
Please do addendum to last OV note (05/26/17) for CPAP supplies order requirements (I spoke with patient & he states he doesn't need a new machine, just supplies)  Also, please sign order in green folder in yellow box  OV note must specifically state pt's usage & benefit of PAP therapy in order for insurance to approve & pay their part of supplies needed  Once this is complete, will fax with order & other required documentation to [facility]

## 2017-06-14 ENCOUNTER — Telehealth: Payer: Self-pay | Admitting: Family Medicine

## 2017-06-14 NOTE — Telephone Encounter (Signed)
Please do addendum to last OV note (05/26/17-seen by Dr. Richardson Landry) for CPAP supplies order requirements   OV note must specifically state pt's usage & benefit of PAP therapy in order for insurance to approve & pay their part of supplies needed  (order has been signed, just need OV note addendum so that I can send required documentation with order)  Please advise

## 2017-06-14 NOTE — Telephone Encounter (Signed)
Received signed order, still need OV note addendum, sent new message

## 2017-06-14 NOTE — Telephone Encounter (Signed)
Done, sorry took awhile

## 2017-06-15 NOTE — Telephone Encounter (Signed)
Faxed signed order & all required documentation to [facility], they'll contact pt to set up  Sent to be scanned & filed   Called & notified pt

## 2017-07-19 ENCOUNTER — Telehealth: Payer: Self-pay | Admitting: Family Medicine

## 2017-07-19 NOTE — Telephone Encounter (Signed)
Need for Clarification from pharmacy. Ativan 2mg  Tablet. Take one tablet by mouth at bedtimes as needed for sleep. "Note to prescriber: please verify you are aware and there is a need for an opioid and benzo given together. We will document necessity. Thanks"

## 2017-07-21 NOTE — Telephone Encounter (Signed)
Note to pharm I am aware because I prescribe it, also needs both or I would not prescribe it

## 2017-07-21 NOTE — Telephone Encounter (Signed)
[facility] notified.

## 2017-08-18 ENCOUNTER — Other Ambulatory Visit: Payer: Self-pay | Admitting: Family Medicine

## 2017-08-19 NOTE — Telephone Encounter (Signed)
Ok six mo worth 

## 2017-08-23 ENCOUNTER — Encounter: Payer: Self-pay | Admitting: Family Medicine

## 2017-08-23 ENCOUNTER — Ambulatory Visit (INDEPENDENT_AMBULATORY_CARE_PROVIDER_SITE_OTHER): Payer: Medicare HMO | Admitting: Family Medicine

## 2017-08-23 VITALS — BP 116/82 | Ht 66.0 in | Wt 233.2 lb

## 2017-08-23 DIAGNOSIS — I878 Other specified disorders of veins: Secondary | ICD-10-CM | POA: Diagnosis not present

## 2017-08-23 DIAGNOSIS — I10 Essential (primary) hypertension: Secondary | ICD-10-CM | POA: Diagnosis not present

## 2017-08-23 DIAGNOSIS — F5101 Primary insomnia: Secondary | ICD-10-CM

## 2017-08-23 DIAGNOSIS — G8929 Other chronic pain: Secondary | ICD-10-CM

## 2017-08-23 DIAGNOSIS — F1190 Opioid use, unspecified, uncomplicated: Secondary | ICD-10-CM

## 2017-08-23 DIAGNOSIS — M5440 Lumbago with sciatica, unspecified side: Secondary | ICD-10-CM | POA: Diagnosis not present

## 2017-08-23 DIAGNOSIS — G4733 Obstructive sleep apnea (adult) (pediatric): Secondary | ICD-10-CM

## 2017-08-23 DIAGNOSIS — E1100 Type 2 diabetes mellitus with hyperosmolarity without nonketotic hyperglycemic-hyperosmolar coma (NKHHC): Secondary | ICD-10-CM | POA: Diagnosis not present

## 2017-08-23 DIAGNOSIS — Z79891 Long term (current) use of opiate analgesic: Secondary | ICD-10-CM | POA: Diagnosis not present

## 2017-08-23 LAB — POCT GLYCOSYLATED HEMOGLOBIN (HGB A1C): Hemoglobin A1C: 4.9 % (ref 4.0–5.6)

## 2017-08-23 MED ORDER — OXYCODONE-ACETAMINOPHEN 5-325 MG PO TABS: ORAL_TABLET | ORAL | 0 refills | Status: DC

## 2017-08-23 NOTE — Progress Notes (Signed)
Subjective:    Patient ID: Dennis Mitchell, male    DOB: 11/20/1950, 67 y.o.   MRN: [id number]  HPI This patient was seen today for chronic pain  The medication list was reviewed and updated.   -Compliance with medication: yes  - Number patient states they take daily: 1-2  -when was the last dose patient took? This am  The patient was advised the importance of maintaining medication and not using illegal substances with these.  Here for refills and follow up  The patient was educated that we can provide 3 monthly scripts for their medication, it is their responsibility to follow the instructions.  Side effects or complications from medications: none  Patient is aware that pain medications are meant to minimize the severity of the pain to allow their pain levels to improve to allow for better function. They are aware of that pain medications cannot totally remove their pain.  Due for UDT ( at least once per year) : today   Results for orders placed or performed in visit on 08/23/17  POCT glycosylated hemoglobin (Hb A1C)  Result Value Ref Range   Hemoglobin A1C 4.9 4.0 - 5.6 %   HbA1c POC (<> result, manual entry)     HbA1c, POC (prediabetic range)     HbA1c, POC (controlled diabetic range)       Patient claims compliance with diabetes medication. No obvious side effects. Reports no substantial low sugar spells. Most numbers are generally in good range when checked fasting. Generally does not miss a dose of medication. Watching diabetic diet closely  Lab Results    ** No results found for the last 12 hours. **     Patient notes ongoing compliance with antidepressant medication. No obvious side effects. Reports does not miss a dose. Overall continues to help depression substantially. No thoughts of homicide or suicide. Would like to maintain medication.   Blood pressure medicine and blood pressure levels reviewed today with patient. Compliant with blood pressure medicine.  States does not miss a dose. No obvious side effects. Blood pressure generally good when checked elsewhere. Watching salt intake.  Patient compliant with pain medication. Continues to experience the pain which led to initiation of analgesic intervention. No significant negative side effects. States definitely needs the pain medication to maintain current level of functioning. Does not receive controlled substance pain medication elsewhere.  New cpap maching handloing well  vertigoo     Review of Systems No headache, no major weight loss or weight gain, no chest pain no back pain abdominal pain no change in bowel habits complete ROS otherwise negative     Objective:   Physical Exam Alert and oriented, vitals reviewed and stable, NAD ENT-TM's and ext canals WNL bilat via otoscopic exam Soft palate, tonsils and post pharynx WNL via oropharyngeal exam Neck-symmetric, no masses; thyroid nonpalpable and nontender Pulmonary-no tachypnea or accessory muscle use; Clear without wheezes via auscultation Card--no abnrml murmurs, rhythm reg and rate WNL Carotid pulses symmetric, without bruits        Assessment & Plan:  Impression: Chronic pain. Patient compliant with medication. No substantial side effects. [place] controlled substance registry reviewed to ensure compliance and proper use of medication. Patient aware goal of medicine is not complete resolution of pain but to control his symptoms to improve his functional capacity. Aware of potential adverse side effects  #2 type 2 diabetes.  Excellent control discussed maintain same  3.  Depression clinically stable.  Handling meds  well no signs or symptoms of worsening patient will maintain  4.  Hypertension good control discussed  5.  Chronic sleep apnea on the CPAP machine looking well  6.  Vertigo intermittent attacks.  Requires a fair amount of meclizine.  Definitely helps  Greater than 50% of this 25 minute face to face  visit was spent in counseling and discussion and coordination of care regarding the above diagnosis/diagnosies .

## 2017-08-25 ENCOUNTER — Other Ambulatory Visit: Payer: Self-pay | Admitting: Family Medicine

## 2017-08-26 NOTE — Telephone Encounter (Signed)
Ok six mo worth 

## 2017-08-28 LAB — TOXASSURE SELECT 13 (MW), URINE

## 2017-11-05 ENCOUNTER — Other Ambulatory Visit: Payer: Self-pay | Admitting: Family Medicine

## 2017-11-23 ENCOUNTER — Ambulatory Visit: Payer: Medicare HMO | Admitting: Family Medicine

## 2017-11-24 ENCOUNTER — Ambulatory Visit (INDEPENDENT_AMBULATORY_CARE_PROVIDER_SITE_OTHER): Payer: Medicare HMO | Admitting: Family Medicine

## 2017-11-24 ENCOUNTER — Encounter: Payer: Self-pay | Admitting: Family Medicine

## 2017-11-24 VITALS — BP 130/90 | Ht 66.0 in | Wt 233.4 lb

## 2017-11-24 DIAGNOSIS — E1100 Type 2 diabetes mellitus with hyperosmolarity without nonketotic hyperglycemic-hyperosmolar coma (NKHHC): Secondary | ICD-10-CM | POA: Diagnosis not present

## 2017-11-24 DIAGNOSIS — Z13220 Encounter for screening for lipoid disorders: Secondary | ICD-10-CM

## 2017-11-24 DIAGNOSIS — G4733 Obstructive sleep apnea (adult) (pediatric): Secondary | ICD-10-CM

## 2017-11-24 DIAGNOSIS — Z79899 Other long term (current) drug therapy: Secondary | ICD-10-CM

## 2017-11-24 DIAGNOSIS — Z125 Encounter for screening for malignant neoplasm of prostate: Secondary | ICD-10-CM | POA: Diagnosis not present

## 2017-11-24 DIAGNOSIS — I10 Essential (primary) hypertension: Secondary | ICD-10-CM

## 2017-11-24 DIAGNOSIS — F5101 Primary insomnia: Secondary | ICD-10-CM

## 2017-11-24 DIAGNOSIS — Z23 Encounter for immunization: Secondary | ICD-10-CM | POA: Diagnosis not present

## 2017-11-24 DIAGNOSIS — M5440 Lumbago with sciatica, unspecified side: Secondary | ICD-10-CM | POA: Diagnosis not present

## 2017-11-24 DIAGNOSIS — G8929 Other chronic pain: Secondary | ICD-10-CM

## 2017-11-24 LAB — POCT GLYCOSYLATED HEMOGLOBIN (HGB A1C): Hemoglobin A1C: 5.2 % (ref 4.0–5.6)

## 2017-11-24 MED ORDER — OXYCODONE-ACETAMINOPHEN 5-325 MG PO TABS: ORAL_TABLET | ORAL | 0 refills | Status: DC

## 2017-11-24 NOTE — Progress Notes (Signed)
Subjective:    Patient ID: Dennis Mitchell, male    DOB: December 16, 1950, 67 y.o.   MRN: [id number]  Diabetes  He presents for his follow-up diabetic visit. He has type 2 diabetes mellitus. There are no hypoglycemic associated symptoms. There are no diabetic associated symptoms. There are no hypoglycemic complications. There are no diabetic complications. He does not see a podiatrist.Eye exam is not current.   This patient was seen today for chronic pain  The medication list was reviewed and updated.   -Compliance with medication: yes  - Number patient states they take daily: most of time just 1, sometimes 2 if splitting wood  -when was the last dose patient took? This morning  The patient was advised the importance of maintaining medication and not using illegal substances with these.  Here for refills and follow up  The patient was educated that we can provide 3 monthly scripts for their medication, it is their responsibility to follow the instructions.  Side effects or complications from medications: none  Patient is aware that pain medications are meant to minimize the severity of the pain to allow their pain levels to improve to allow for better function. They are aware of that pain medications cannot totally remove their pain.  Due for UDT ( at least once per year) : up to date  Pt would like refill on Flexeril.    Results for orders placed or performed in visit on 11/24/17  POCT HgB A1C  Result Value Ref Range   Hemoglobin A1C 5.2 4.0 - 5.6 %   HbA1c POC (<> result, manual entry)     HbA1c, POC (prediabetic range)     HbA1c, POC (controlled diabetic range)     Patient claims compliance with diabetes medication. No obvious side effects. Reports no substantial low sugar spells. Most numbers are generally in good range when checked fasting. Generally does not miss a dose of medication. Watching diabetic diet closely  Patient compliant with pain medication. Continues to  experience the pain which led to initiation of analgesic intervention. No significant negative side effects. States definitely needs the pain medication to maintain current level of functioning. Does not receive controlled substance pain medication elsewhere.  Patient compliant with insomnia medication. Generally takes most nights. No obvious morning drowsiness. Definitely helps patient sleep. Without it patient states would not get a good nights rest.  Patient notes ongoing compliance with antidepressant medication. No obvious side effects. Reports does not miss a dose. Overall continues to help depression substantially. No thoughts of homicide or suicide. Would like to maintain medication.  Walking daily on treadmill   Vertigo still an ongoing problem                                                                                    Review of Systems No headache, no major weight loss or weight gain, no chest pain no back pain abdominal pain no change in bowel habits complete ROS otherwise negative     Objective:   Physical Exam Alert and oriented, vitals reviewed and stable, NAD ENT-TM's and ext canals WNL bilat via otoscopic exam Soft palate, tonsils and post pharynx  WNL via oropharyngeal exam Neck-symmetric, no masses; thyroid nonpalpable and nontender Pulmonary-no tachypnea or accessory muscle use; Clear without wheezes via auscultation Card--no abnrml murmurs, rhythm reg and rate WNL Carotid pulses symmetric, without bruits        Assessment & Plan:  Impression type 2 diabetes.  A1c excellent.  Diet discussed.  Maintain same meds  2.  Hypertension.  Good control discussed maintain same meds  3.  Depression.  Clinically stable.  Patient wishes to stay on the medication will maintain  4.  Sleep apnea ongoing to maintain CPAP  5.  Chronic venous stasis.  Challenges with easy bleeding of the ankles  and lower legs.  Etiology discussed  Impression: Chronic pain. Patient compliant with medication. No substantial side effects. [place] controlled substance registry reviewed to ensure compliance and proper use of medication. Patient aware goal of medicine is not complete resolution of pain but to control his symptoms to improve his functional capacity. Aware of potential adverse side effects  Greater than 50% of this 40 minute face to face visit was spent in counseling and discussion and coordination of care regarding the above diagnosis/diagnosies Appropriate blood work/flu shot pneumococcal vaccine/diet exercise discussed/pain medications refilled/follow-up in several months

## 2017-11-25 LAB — BASIC METABOLIC PANEL
BUN/Creatinine Ratio: 14 (ref 10–24)
BUN: 14 mg/dL (ref 8–27)
CALCIUM: 9.1 mg/dL (ref 8.6–10.2)
CHLORIDE: 98 mmol/L (ref 96–106)
CO2: 29 mmol/L (ref 20–29)
Creatinine, Ser: 0.97 mg/dL (ref 0.76–1.27)
GFR, EST AFRICAN AMERICAN: 93 mL/min/{1.73_m2} (ref >59)
GFR, EST NON AFRICAN AMERICAN: 80 mL/min/{1.73_m2} (ref >59)
Glucose: 103 mg/dL — ABNORMAL HIGH (ref 65–99)
POTASSIUM: 4.4 mmol/L (ref 3.5–5.2)
Sodium: 139 mmol/L (ref 134–144)

## 2017-11-25 LAB — CBC WITH DIFFERENTIAL/PLATELET
BASOS ABS: 0 10*3/uL (ref 0.0–0.2)
Basos: 1 %
EOS (ABSOLUTE): 0.1 10*3/uL (ref 0.0–0.4)
Eos: 2 %
HEMOGLOBIN: 15.3 g/dL (ref 13.0–17.7)
Hematocrit: 45 % (ref 37.5–51.0)
IMMATURE GRANS (ABS): 0 10*3/uL (ref 0.0–0.1)
IMMATURE GRANULOCYTES: 1 %
LYMPHS: 15 %
Lymphocytes Absolute: 1 10*3/uL (ref 0.7–3.1)
MCH: 29.8 pg (ref 26.6–33.0)
MCHC: 34 g/dL (ref 31.5–35.7)
MCV: 88 fL (ref 79–97)
MONOCYTES: 9 %
Monocytes Absolute: 0.6 10*3/uL (ref 0.1–0.9)
NEUTROS PCT: 72 %
Neutrophils Absolute: 5.1 10*3/uL (ref 1.4–7.0)
PLATELETS: 221 10*3/uL (ref 150–450)
RBC: 5.14 x10E6/uL (ref 4.14–5.80)
RDW: 13.4 % (ref 12.3–15.4)
WBC: 6.8 10*3/uL (ref 3.4–10.8)

## 2017-11-25 LAB — HEPATIC FUNCTION PANEL
ALBUMIN: 4.4 g/dL (ref 3.6–4.8)
ALT: 16 IU/L (ref 0–44)
AST: 21 IU/L (ref 0–40)
Alkaline Phosphatase: 75 IU/L (ref 39–117)
BILIRUBIN TOTAL: 0.4 mg/dL (ref 0.0–1.2)
Bilirubin, Direct: 0.12 mg/dL (ref 0.00–0.40)
Total Protein: 7 g/dL (ref 6.0–8.5)

## 2017-11-25 LAB — LIPID PANEL
CHOL/HDL RATIO: 3 ratio (ref 0.0–5.0)
CHOLESTEROL TOTAL: 183 mg/dL (ref 100–199)
HDL: 61 mg/dL (ref >39)
LDL CALC: 113 mg/dL — AB (ref 0–99)
Triglycerides: 43 mg/dL (ref 0–149)
VLDL CHOLESTEROL CAL: 9 mg/dL (ref 5–40)

## 2017-11-25 LAB — MICROALBUMIN / CREATININE URINE RATIO
CREATININE, UR: 40.6 mg/dL
MICROALB/CREAT RATIO: 84 mg/g{creat} — AB (ref 0.0–30.0)
MICROALBUM., U, RANDOM: 34.1 ug/mL

## 2017-11-25 LAB — PSA: Prostate Specific Ag, Serum: 0.2 ng/mL (ref 0.0–4.0)

## 2017-11-29 ENCOUNTER — Encounter: Payer: Self-pay | Admitting: Family Medicine

## 2017-12-01 ENCOUNTER — Other Ambulatory Visit: Payer: Self-pay | Admitting: Family Medicine

## 2018-01-27 ENCOUNTER — Encounter: Payer: Self-pay | Admitting: Family Medicine

## 2018-01-27 DIAGNOSIS — H524 Presbyopia: Secondary | ICD-10-CM | POA: Diagnosis not present

## 2018-01-27 DIAGNOSIS — H2513 Age-related nuclear cataract, bilateral: Secondary | ICD-10-CM | POA: Diagnosis not present

## 2018-01-27 DIAGNOSIS — H5202 Hypermetropia, left eye: Secondary | ICD-10-CM | POA: Diagnosis not present

## 2018-01-27 DIAGNOSIS — E119 Type 2 diabetes mellitus without complications: Secondary | ICD-10-CM | POA: Diagnosis not present

## 2018-01-27 LAB — HM DIABETES EYE EXAM

## 2018-02-24 ENCOUNTER — Encounter: Payer: Self-pay | Admitting: Family Medicine

## 2018-02-24 ENCOUNTER — Ambulatory Visit (INDEPENDENT_AMBULATORY_CARE_PROVIDER_SITE_OTHER): Payer: Medicare HMO | Admitting: Family Medicine

## 2018-02-24 VITALS — BP 132/80 | Ht 66.0 in | Wt 232.4 lb

## 2018-02-24 DIAGNOSIS — E119 Type 2 diabetes mellitus without complications: Secondary | ICD-10-CM

## 2018-02-24 LAB — POCT GLYCOSYLATED HEMOGLOBIN (HGB A1C): Hemoglobin A1C: 5.3 % (ref 4.0–5.6)

## 2018-02-24 MED ORDER — OXYCODONE-ACETAMINOPHEN 5-325 MG PO TABS: ORAL_TABLET | ORAL | 0 refills | Status: DC

## 2018-02-24 NOTE — Progress Notes (Signed)
Subjective:    Patient ID: Dennis Mitchell, male    DOB: 02-Dec-1950, 68 y.o.   MRN: [id number] Patient arrives with numerous concerns HPI This patient was seen today for chronic pain.  Takes for back and neck pain.   Takes oxycodone 5/325.  The medication list was reviewed and updated.   -Compliance with medication: takes one daily.   - Number patient states they take daily: one daily  -when was the last dose patient took? This morning  The patient was advised the importance of maintaining medication and not using illegal substances with these.  Here for refills and follow up  The patient was educated that we can provide 3 monthly scripts for their medication, it is their responsibility to follow the instructions.  Side effects or complications from medications: none  Patient is aware that pain medications are meant to minimize the severity of the pain to allow their pain levels to improve to allow for better function. They are aware of that pain medications cannot totally remove their pain.  Due for UDT ( at least once per year) : last one august 2019  Black spot in right eye. Started yesterday.  On further history tiny little black specks floating in his field of vision left greater than right.  History of this off and on.  Normal eye exam just last month  Results for orders placed or performed in visit on 02/24/18  POCT glycosylated hemoglobin (Hb A1C)  Result Value Ref Range   Hemoglobin A1C 5.3 4.0 - 5.6 %   HbA1c POC (<> result, manual entry)     HbA1c, POC (prediabetic range)     HbA1c, POC (controlled diabetic range)       No low sugar spells, watching diet closely  Knee pain, bilat, using cbd oil and helping   Patient claims compliance with diabetes medication. No obvious side effects. Reports no substantial low sugar spells. Most numbers are generally in good range when checked fasting. Generally does not miss a dose of medication. Watching diabetic diet  closely  Patient compliant with pain medication. Continues to experience the pain which led to initiation of analgesic intervention. No significant negative side effects. States definitely needs the pain medication to maintain current level of functioning. Does not receive controlled substance pain medication elsewhere.   Blood pressure medicine and blood pressure levels reviewed today with patient. Compliant with blood pressure medicine. States does not miss a dose. No obvious side effects. Blood pressure generally good when checked elsewhere. Watching salt intake.   Review of Systems No headache, no major weight loss or weight gain, no chest pain no back pain abdominal pain no change in bowel habits complete ROS otherwise negative     Objective:   Physical Exam  Alert and oriented, vitals reviewed and stable, NAD ENT-TM's and ext canals WNL bilat via otoscopic exam Soft palate, tonsils and post pharynx WNL via oropharyngeal exam Neck-symmetric, no masses; thyroid nonpalpable and nontender Pulmonary-no tachypnea or accessory muscle use; Clear without wheezes via auscultation Card--no abnrml murmurs, rhythm reg and rate WNL Carotid pulses symmetric, without bruits       Assessment & Plan:  Impression 1 type 2 diabetes.  Good control discussed to maintain same approach  2.  Hypertension blood pressure stable to maintain same  3.  Chronic venous stasis multiple questions answered.  4.  Chronic pain  Impression: Chronic pain. Patient compliant with medication. No substantial side effects. [place] controlled substance registry reviewed to  ensure compliance and proper use of medication. Patient aware goal of medicine is not complete resolution of pain but to control his symptoms to improve his functional capacity. Aware of potential adverse side effects

## 2018-03-10 ENCOUNTER — Other Ambulatory Visit: Payer: Self-pay | Admitting: Family Medicine

## 2018-03-10 NOTE — Telephone Encounter (Signed)
Six mo worth 

## 2018-03-19 ENCOUNTER — Other Ambulatory Visit: Payer: Self-pay | Admitting: Family Medicine

## 2018-03-21 ENCOUNTER — Other Ambulatory Visit: Payer: Self-pay | Admitting: Family Medicine

## 2018-04-04 ENCOUNTER — Other Ambulatory Visit: Payer: Self-pay | Admitting: Family Medicine

## 2018-04-07 ENCOUNTER — Other Ambulatory Visit: Payer: Self-pay | Admitting: Family Medicine

## 2018-05-26 ENCOUNTER — Encounter: Payer: Self-pay | Admitting: Family Medicine

## 2018-05-26 ENCOUNTER — Other Ambulatory Visit: Payer: Self-pay

## 2018-05-26 ENCOUNTER — Ambulatory Visit (INDEPENDENT_AMBULATORY_CARE_PROVIDER_SITE_OTHER): Payer: Medicare HMO | Admitting: Family Medicine

## 2018-05-26 DIAGNOSIS — G8929 Other chronic pain: Secondary | ICD-10-CM | POA: Diagnosis not present

## 2018-05-26 DIAGNOSIS — M5440 Lumbago with sciatica, unspecified side: Secondary | ICD-10-CM

## 2018-05-26 DIAGNOSIS — I10 Essential (primary) hypertension: Secondary | ICD-10-CM

## 2018-05-26 DIAGNOSIS — E1100 Type 2 diabetes mellitus with hyperosmolarity without nonketotic hyperglycemic-hyperosmolar coma (NKHHC): Secondary | ICD-10-CM | POA: Diagnosis not present

## 2018-05-26 DIAGNOSIS — F5101 Primary insomnia: Secondary | ICD-10-CM

## 2018-05-26 MED ORDER — OXYCODONE-ACETAMINOPHEN 5-325 MG PO TABS: ORAL_TABLET | ORAL | 0 refills | Status: DC

## 2018-05-26 NOTE — Progress Notes (Signed)
Subjective:    Patient ID: Dennis Mitchell, male    DOB: [birth year], 68 y.o.   MRN: [id number] Video plus audio  Patient presents with numerous concerns Diabetes  He presents for his follow-up diabetic visit. He has type 2 diabetes mellitus. Risk factors for coronary artery disease include diabetes mellitus and hypertension. Current diabetic treatment includes diet. He is compliant with treatment all of the time. His weight is stable. He is following a diabetic diet.   This patient was seen today for chronic pain  The medication list was reviewed and updated.   -Compliance with medication: yes  - Number patient states they take daily: 2  -when was the last dose patient took? today  The patient was advised the importance of maintaining medication and not using illegal substances with these.  Here for refills and follow up  The patient was educated that we can provide 3 monthly scripts for their medication, it is their responsibility to follow the instructions.  Side effects or complications from medications: none  Patient is aware that pain medications are meant to minimize the severity of the pain to allow their pain levels to improve to allow for better function. They are aware of that pain medications cannot totally remove their pain.  Virtual Visit via Video Note  I connected with Dennis Mitchell on 05/26/18 at  9:00 AM EDT by a video enabled telemedicine application and verified that I am speaking with the correct person using two identifiers.  Location: Patient: home Provider: office   I discussed the limitations of evaluation and management by telemedicine and the availability of in person appointments. The patient expressed understanding and agreed to proceed.  History of Present Illness:    Observations/Objective:   Assessment and Plan:   Follow Up Instructions:    I discussed the assessment and treatment plan with the patient. The patient was provided an  opportunity to ask questions and all were answered. The patient agreed with the plan and demonstrated an understanding of the instructions.   The patient was advised to call back or seek an in-person evaluation if the symptoms worsen or if the condition fails to improve as anticipated.  I provided 25 minutes of non-face-to-face time during this encounter.   Blood pressure medicine and blood pressure levels reviewed today with patient. Compliant with blood pressure medicine. States does not miss a dose. No obvious side effects. Blood pressure generally good when checked elsewhere. Watching salt intake.  Patient claims compliance with diabetes medication. No obvious side effects. Reports no substantial low sugar spells. Most numbers are generally in good range when checked fasting. Generally does not miss a dose of medication. Watching diabetic diet closely  Patient compliant with pain medication. Continues to experience the pain which led to initiation of analgesic intervention. No significant negative side effects. States definitely needs the pain medication to maintain current level of functioning. Does not receive controlled substance pain medication elsewhere.    Patient notes ongoing compliance with antidepressant medication. No obvious side effects. Reports does not miss a dose. Overall continues to help depression substantially. No thoughts of homicide or suicide. Would like to maintain medication.          Review of Systems No headache, no major weight loss or weight gain, no chest pain no back pain abdominal pain no change in bowel habits complete ROS otherwise negative     Objective:   Physical Exam  Virtual visit      Assessment &  Plan:  Impression 1 type 2 diabetes.  Apparent good control.  Compliance discussed diet discussed  2.  Hypertension.  Blood pressure good.  Patient maintain on meds discussed  3.  Chronic history of depression element of anxiety clinically  stable per patient to maintain same meds  4.  Impression: Chronic pain. Patient compliant with medication. No substantial side effects. [place] controlled substance registry reviewed to ensure compliance and proper use of medication. Patient aware goal of medicine is not complete resolution of pain but to control his symptoms to improve his functional capacity. Aware of potential adverse side effects  Greater than 50% of this 25 minute face to face visit was spent in counseling and discussion and coordination of care regarding the above diagnosis/diagnosies

## 2018-05-28 ENCOUNTER — Other Ambulatory Visit: Payer: Self-pay | Admitting: Family Medicine

## 2018-06-27 ENCOUNTER — Encounter: Payer: Self-pay | Admitting: Family Medicine

## 2018-06-27 ENCOUNTER — Ambulatory Visit (INDEPENDENT_AMBULATORY_CARE_PROVIDER_SITE_OTHER): Payer: Medicare Other | Admitting: Family Medicine

## 2018-06-27 ENCOUNTER — Other Ambulatory Visit: Payer: Self-pay

## 2018-06-27 VITALS — Temp 98.2°F | Ht 66.0 in | Wt 232.0 lb

## 2018-06-27 DIAGNOSIS — L03115 Cellulitis of right lower limb: Secondary | ICD-10-CM | POA: Diagnosis not present

## 2018-06-27 MED ORDER — DOXYCYCLINE HYCLATE 100 MG PO TABS: 100.0000 mg | ORAL_TABLET | Freq: Two times a day (BID) | ORAL | 0 refills | Status: DC

## 2018-06-27 MED ORDER — CIPROFLOXACIN HCL 500 MG PO TABS: 500.0000 mg | ORAL_TABLET | Freq: Two times a day (BID) | ORAL | 0 refills | Status: DC

## 2018-06-27 NOTE — Progress Notes (Signed)
   Subjective:    Patient ID: Dennis Mitchell, male    DOB: 10-15-1950, 68 y.o.   MRN: [id number]  HPI Patient arrives with presumed spider bite to right foot. Patient states he leaves his garden shoes outside and when he put them on Wednesday and watered the garden he noticed a red area when he took them off. The area is now swollen red and painful.  No fever no chills  Think he was a bit on the top of the foot saw a small red spot after he took his shoes off when being out in the garden.  Is developed subsequent swelling tenderness pain  History of serious venous stasis on that leg and history of prior venous ulceration  Review of Systems No headache, no major weight loss or weight gain, no chest pain no back pain abdominal pain no change in bowel habits complete ROS otherwise negative     Objective:   Physical Exam  Alert vitals stable, NAD. Blood pressure good on repeat. HEENT normal. Lungs clear. Heart regular rate and rhythm.  Right dorsum foot inflammation erythema/warmth/tender capillary refill intact pulses obscured by chronic venous stasis     Assessment & Plan:  Impression foot cellulitis and known diabetic with severe venous stasis possible bite plan and doxy and Cipro and recheck in 48 hours local measures discussed

## 2018-06-29 ENCOUNTER — Other Ambulatory Visit: Payer: Self-pay

## 2018-06-29 ENCOUNTER — Ambulatory Visit (INDEPENDENT_AMBULATORY_CARE_PROVIDER_SITE_OTHER): Payer: Medicare Other | Admitting: Family Medicine

## 2018-06-29 ENCOUNTER — Encounter: Payer: Self-pay | Admitting: Family Medicine

## 2018-06-29 VITALS — Temp 98.0°F | Ht 66.0 in | Wt 223.2 lb

## 2018-06-29 DIAGNOSIS — L03115 Cellulitis of right lower limb: Secondary | ICD-10-CM

## 2018-06-29 NOTE — Progress Notes (Signed)
   Subjective:    Patient ID: Dennis Mitchell, male    DOB: Jul 19, 1950, 68 y.o.   MRN: [id number]  HPI  Patient arrives for a follow up on right foot cellulitis. Patient states it is doing a lot better on antibiotic.  Patient on both Cipro and doxy.  Doing well.  No obvious side effects  Notes some swelling in the foot persists try to keep leg open possible Review of Systems No fever no chills no headache    Objective:   Physical Exam  Alert vitals stable, NAD. Blood pressure good on repeat. HEENT normal. Lungs clear. Heart regular rate and rhythm. Right foot less edema less erythema still tender still edematous      Assessment & Plan:  Impression cellulitis foot with known diabetes and severe venous disease local measures discussed maintain antibiotics but gradual resolution warning signs discussed

## 2018-09-07 ENCOUNTER — Other Ambulatory Visit: Payer: Self-pay | Admitting: Family Medicine

## 2018-09-07 NOTE — Telephone Encounter (Signed)
Six mo 

## 2018-09-09 ENCOUNTER — Other Ambulatory Visit: Payer: Self-pay | Admitting: Family Medicine

## 2018-09-09 NOTE — Telephone Encounter (Signed)
Six mo worth 

## 2018-09-12 ENCOUNTER — Other Ambulatory Visit: Payer: Self-pay | Admitting: Family Medicine

## 2018-09-12 NOTE — Telephone Encounter (Signed)
Six mo ok 

## 2018-10-03 ENCOUNTER — Other Ambulatory Visit: Payer: Self-pay

## 2018-10-03 ENCOUNTER — Ambulatory Visit
Admission: EM | Admit: 2018-10-03 | Discharge: 2018-10-03 | Disposition: A | Discharge status: Home / Self Care | Payer: Medicare Other | Attending: Emergency Medicine | Admitting: Emergency Medicine

## 2018-10-03 DIAGNOSIS — Z20828 Contact with and (suspected) exposure to other viral communicable diseases: Secondary | ICD-10-CM | POA: Diagnosis not present

## 2018-10-03 DIAGNOSIS — R6889 Other general symptoms and signs: Secondary | ICD-10-CM | POA: Diagnosis not present

## 2018-10-03 DIAGNOSIS — Z20822 Contact with and (suspected) exposure to covid-19: Secondary | ICD-10-CM

## 2018-10-03 NOTE — Discharge Instructions (Signed)
COVID testing ordered.  It will take between 5-7 days for results to return.  We will contact you regarding abnormal results.    In the meantime: You should remain isolated in your home for 10 days from symptom onset AND greater than 72 hours after symptoms resolution (absence of fever without the use of fever-reducing medication and improvement in respiratory symptoms), whichever is longer Get plenty of rest and push fluids Use OTC flonase and/or antihistamine as needed for runny nose, congestion, and/or sore throat Use OTC medications like ibuprofen or tylenol as needed fever or pain Follow up with PCP via video or telephone later this week for recheck Call or go to the ED if you have any new or worsening symptoms such as fever, worsening cough, shortness of breath, chest tightness, chest pain, turning blue, changes in mental status, etc..Marland Kitchen

## 2018-10-03 NOTE — ED Provider Notes (Signed)
[place]   [id number] 10/03/18 Arrival Time: 87   CC: COVID exposure  SUBJECTIVE: History from: patient.  Dennis Mitchell is a 68 y.o. male who presents here for COVID testing.  States he had an exposure last week to someone who tested positive for COVID. 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 Currently asymptomatic.  Denies previous COVID infection.   Denies fever, chills, fatigue, congestion, rhinorrhea, sore throat, cough, SOB, wheezing, chest pain, nausea, vomiting, changes in bowel or bladder habits.    ROS: As per HPI.  All other pertinent ROS negative.     Past Medical History:  Diagnosis Date  . Anxiety   . Arthritis   . Back pain   . Depression   . Diabetes mellitus   . Dysphagia   . Fatigue   . History of hiatal hernia   . Hypercholesteremia   . Hypertension   . Neuropathic pain   . Skin cancer of face   . Sleep apnea    Cpap   Past Surgical History:  Procedure Laterality Date  . BREATH TEK H PYLORI N/A 03/01/2014   Procedure: BREATH TEK H PYLORI;  Surgeon: Alphonsa Overall, MD;  Location: [facility];  Service: General;  Laterality: N/A;  . COLONOSCOPY N/A 09/20/2013   Procedure: COLONOSCOPY;  Surgeon: Rogene Houston, MD;  Location: [facility];  Service: Endoscopy;  Laterality: N/A;  930  . DOG BITE    . LAPAROSCOPIC GASTRIC SLEEVE RESECTION WITH HIATAL HERNIA REPAIR N/A 03/19/2015   Procedure: LAPAROSCOPIC GASTRIC SLEEVE RESECTION WITH  HIATAL HERNIA REPAIR;  Surgeon: Alphonsa Overall, MD;  Location: [facility];  Service: General;  Laterality: N/A;  . TRACHEOSTOMY  2010  . UPPER GI ENDOSCOPY  03/19/2015   Procedure: UPPER GI ENDOSCOPY;  Surgeon: Alphonsa Overall, MD;  Location: [facility];  Service: General;;   Allergies  Allergen Reactions  . Bee Venom Shortness Of Breath  . Beef-Derived Products Anaphylaxis  . Pork-Derived Products Anaphylaxis    Tolerated heparin in the past per MD 3/7  tolerated Lovenox pre-op  . Aspirin Other (See Comments)    Lip swelling  . Lisinopril Other (See Comments)    unknown  . Penicillins Hives    .Marland KitchenHas patient had a PCN reaction causing immediate rash, facial/tongue/throat swelling, SOB or lightheadedness with hypotension: Yes -Lips Has patient had a PCN reaction causing severe rash involving mucus membranes or skin necrosis: No Has patient had a PCN reaction that required hospitalization No Has patient had a PCN reaction occurring within the last 10 years: No If all of the above answers are "NO", then may proceed with Cephalosporin use.     No current facility-administered medications on file prior to encounter.    Current Outpatient Medications on File Prior to Encounter  Medication Sig Dispense Refill  . amLODipine (NORVASC) 2.5 MG tablet TAKE 1 TABLET EVERY DAY 90 tablet 1  . ciprofloxacin (CIPRO) 500 MG tablet Take 1 tablet (500 mg total) by mouth 2 (two) times daily. 20 tablet 0  . citalopram (CELEXA) 40 MG tablet TAKE 1 TABLET EVERY DAY 90 tablet 1  . cyclobenzaprine (FLEXERIL) 10 MG tablet TAKE ONE-HALF TO ONE TABLET BY MOUTH THREE TIMES DAILY AS NEEDED FOR  SPASMS 90 tablet 5  . cyclobenzaprine (FLEXERIL) 10 MG tablet TAKE 1/2 TO 1 (ONE-HALF TO ONE) TABLET BY MOUTH THREE TIMES DAILY AS NEEDED FOR SPASMS 100 tablet 0  .  diclofenac sodium (VOLTAREN) 1 % GEL APPLY TOPICALLY TWICE DAILY TO AFFECTED AREA AS NEEDED FOR PAIN 100 g 11  . doxycycline (VIBRA-TABS) 100 MG tablet Take 1 tablet (100 mg total) by mouth 2 (two) times daily. 20 tablet 0  . EPINEPHrine (EPI-PEN) 0.3 mg/0.3 mL DEVI Inject 0.3 mLs (0.3 mg total) into the muscle once. 1 Device 0  . fluorouracil (EFUDEX) 5 % cream Apply 1 application topically daily as needed. Reported on 05/09/2015 40 g 6  . hydrochlorothiazide (HYDRODIURIL) 25 MG tablet TAKE 1 TABLET EVERY DAY 90 tablet 0  . hydrOXYzine (VISTARIL) 25 MG capsule TAKE ONE CAPSULE BY MOUTH EVERY 4 TO 6 HOURS AS NEEDED FOR   ITCHING 36 capsule 5  . LORazepam (ATIVAN) 2 MG tablet TAKE 1 TABLET BY MOUTH AT BEDTIME AS NEEDED FOR SLEEP 30 tablet 5  . meclizine (ANTIVERT) 25 MG tablet TAKE 1 TABLET BY MOUTH THREE TIMES DAILY AS NEEDED FOR DIZZINESS 90 tablet 0  . montelukast (SINGULAIR) 10 MG tablet Take 1 tablet (10 mg total) by mouth at bedtime. 30 tablet 5  . oxyCODONE-acetaminophen (PERCOCET/ROXICET) 5-325 MG tablet TAKE ONE UP TO BID PRN PAIN 45 tablet 0  . oxyCODONE-acetaminophen (PERCOCET/ROXICET) 5-325 MG tablet Take one tablet up to bid prn pain 45 tablet 0  . oxyCODONE-acetaminophen (PERCOCET/ROXICET) 5-325 MG tablet Take one up to bid prn pain 45 tablet 0  . potassium chloride SA (K-DUR,KLOR-CON) 20 MEQ tablet TAKE 1 TABLET EVERY MORNING 90 tablet 1  . sildenafil (REVATIO) 20 MG tablet TAKE 5 TABLETS BY MOUTH EVERY DAY AS NEEDED 2 HOURS BEFORE SEX 600 tablet 0   Social History   Socioeconomic History  . Marital status: Single    Spouse name: Not on file  . Number of children: Not on file  . Years of education: Not on file  . Highest education level: Not on file  Occupational History  . Not on file  Social Needs  . Financial resource strain: Not on file  . Food insecurity    Worry: Not on file    Inability: Not on file  . Transportation needs    Medical: Not on file    Non-medical: Not on file  Tobacco Use  . Smoking status: Former Smoker    Packs/day: 2.00    Years: 40.00    Pack years: 80.00    Types: E-cigarettes    Quit date: 03/13/2009    Years since quitting: 9.5  . Smokeless tobacco: Never Used  . Tobacco comment: Using electric cigarettes  Substance and Sexual Activity  . Alcohol use: Yes    Alcohol/week: 4.0 standard drinks    Types: 4 Cans of beer per week    Comment: OCCASIONAL  . Drug use: No  . Sexual activity: Not on file  Lifestyle  . Physical activity    Days per week: Not on file    Minutes per session: Not on file  . Stress: Not on file  Relationships  . Social  Herbalist on phone: Not on file    Gets together: Not on file    Attends religious service: Not on file    Active member of club or organization: Not on file    Attends meetings of clubs or organizations: Not on file    Relationship status: Not on file  . Intimate partner violence    Fear of current or ex partner: Not on file    Emotionally abused: Not on file  Physically abused: Not on file    Forced sexual activity: Not on file  Other Topics Concern  . Not on file  Social History Narrative  . Not on file   History reviewed. No pertinent family history.  OBJECTIVE:  Vitals:   10/03/18 1130  BP: (!) 167/78  Pulse: 68  Resp: 20  Temp: 97.8 F (36.6 C)  SpO2: 98%     General appearance: alert; well-appearing; speaking in full sentences and tolerating own secretions HEENT: NCAT; Ears: EACs clear, TMs pearly gray; Eyes: PERRL.  EOM grossly intact. Nose: nares patent without rhinorrhea, Throat: oropharynx clear, tonsils non erythematous or enlarged, uvula midline  Neck: supple without LAD Lungs: unlabored respirations, symmetrical air entry; cough: absent; no respiratory distress; CTAB Heart: regular rate and rhythm.  Radial pulses 2+ symmetrical bilaterally Skin: warm and dry Psychological: alert and cooperative; normal mood and affect  ASSESSMENT & PLAN:  1. Exposure to Covid-19 Virus    COVID testing ordered.  It will take between 5-7 days for results to return.  We will contact you regarding abnormal results.    In the meantime: You should remain isolated in your home for 10 days from symptom onset AND greater than 72 hours after symptoms resolution (absence of fever without the use of fever-reducing medication and improvement in respiratory symptoms), whichever is longer Get plenty of rest and push fluids Use OTC flonase and/or antihistamine as needed for runny nose, congestion, and/or sore throat Use OTC medications like ibuprofen or tylenol as needed  fever or pain Follow up with PCP via video or telephone later this week for recheck Call or go to the ED if you have any new or worsening symptoms such as fever, worsening cough, shortness of breath, chest tightness, chest pain, turning blue, changes in mental status, etc...  Reviewed expectations re: course of current medical issues. Questions answered. Outlined signs and symptoms indicating need for more acute intervention. Patient verbalized understanding. After Visit Summary given.         Lestine Box, PA-C 10/03/18 1158

## 2018-10-03 NOTE — ED Triage Notes (Signed)
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

## 2018-10-05 LAB — NOVEL CORONAVIRUS, NAA: SARS-CoV-2, NAA: NOT DETECTED

## 2018-10-10 ENCOUNTER — Other Ambulatory Visit: Payer: Self-pay | Admitting: Family Medicine

## 2018-10-10 ENCOUNTER — Other Ambulatory Visit: Payer: Self-pay | Admitting: *Deleted

## 2018-10-10 MED ORDER — AMLODIPINE BESYLATE 2.5 MG PO TABS: 2.5000 mg | ORAL_TABLET | Freq: Every day | ORAL | 0 refills | Status: DC

## 2018-10-10 MED ORDER — HYDROCHLOROTHIAZIDE 25 MG PO TABS: 25.0000 mg | ORAL_TABLET | Freq: Every day | ORAL | 0 refills | Status: DC

## 2018-10-14 ENCOUNTER — Other Ambulatory Visit: Payer: Self-pay | Admitting: Family Medicine

## 2018-10-21 ENCOUNTER — Other Ambulatory Visit: Payer: Self-pay | Admitting: Family Medicine

## 2018-11-11 ENCOUNTER — Other Ambulatory Visit: Payer: Self-pay

## 2018-11-11 DIAGNOSIS — Z20822 Contact with and (suspected) exposure to covid-19: Secondary | ICD-10-CM

## 2018-11-12 LAB — NOVEL CORONAVIRUS, NAA: SARS-CoV-2, NAA: NOT DETECTED

## 2018-11-14 ENCOUNTER — Telehealth: Payer: Self-pay | Admitting: Family Medicine

## 2018-11-14 NOTE — Telephone Encounter (Signed)
thx for update, see u soon

## 2018-11-14 NOTE — Telephone Encounter (Signed)
Pt calling for covid results; negative, tested Friday, 10/30.  Pt did have direct contact with his boss who tested positive Wednesday of last week. States rode in car together and ate lunch without masks. Pt is asymptomatic. Advised pt to quarantine as well due to direct contact. Pt will self isolate 10 days from test date. Reviewed precautions, pt verbalizes understanding.

## 2018-11-21 ENCOUNTER — Ambulatory Visit (INDEPENDENT_AMBULATORY_CARE_PROVIDER_SITE_OTHER): Payer: Medicare Other | Admitting: Family Medicine

## 2018-11-21 DIAGNOSIS — G542 Cervical root disorders, not elsewhere classified: Secondary | ICD-10-CM

## 2018-11-21 DIAGNOSIS — M542 Cervicalgia: Secondary | ICD-10-CM | POA: Diagnosis not present

## 2018-11-21 MED ORDER — PREDNISONE 10 MG PO TABS: ORAL_TABLET | ORAL | 0 refills | Status: DC

## 2018-11-21 MED ORDER — OXYCODONE-ACETAMINOPHEN 5-325 MG PO TABS: ORAL_TABLET | ORAL | 0 refills | Status: DC

## 2018-11-21 NOTE — Progress Notes (Signed)
   Subjective:    Patient ID: Dennis Mitchell, male    DOB: 08-Oct-1950, 68 y.o.   MRN: [id number]  HPI  Patient calls with tingling in right arm and hand for 3 days-better today than the last 2 days. Patient believes it is coming from a shoulder injury 6 months ago.  Virtual Visit via Video Note  I connected with Dennis Mitchell on 11/21/18 at  3:50 PM EST by a video enabled telemedicine application and verified that I am speaking with the correct person using two identifiers.  Location: Patient: home Provider: office   I discussed the limitations of evaluation and management by telemedicine and the availability of in person appointments. The patient expressed understanding and agreed to proceed.  History of Present Illness:    Observations/Objective:   Assessment and Plan:   Follow Up Instructions:    I discussed the assessment and treatment plan with the patient. The patient was provided an opportunity to ask questions and all were answered. The patient agreed with the plan and demonstrated an understanding of the instructions.   The patient was advised to call back or seek an in-person evaluation if the symptoms worsen or if the condition fails to improve as anticipated.  I provided 18 minutes of non-face-to-face time during this encounter.   Patient notes tingling in fingers of right hand.  Not focal.  Sometimes accompanied by neck pain.  Neck pain worse if he turns his head or neck in certain directions.  Relates all this to a remote shoulder injury 6 months ago.  Review of Systems No headache, no major weight loss or weight gain, no chest pain no back pain abdominal pain no change in bowel habits complete ROS otherwise negative     Objective:   Physical Exam Virtual       Assessment & Plan:  Cervical pain with neuropathic element/worsening with neck movement and tingling.  Will give prednisone taper.  Also will refill patient's oxycodone follow-up next week for  this and other chronic concerns

## 2018-11-30 ENCOUNTER — Encounter: Payer: Self-pay | Admitting: Family Medicine

## 2018-11-30 ENCOUNTER — Ambulatory Visit (INDEPENDENT_AMBULATORY_CARE_PROVIDER_SITE_OTHER): Payer: Medicare Other | Admitting: Family Medicine

## 2018-11-30 ENCOUNTER — Other Ambulatory Visit: Payer: Self-pay

## 2018-11-30 VITALS — BP 158/98 | Temp 97.4°F | Wt 228.0 lb

## 2018-11-30 DIAGNOSIS — M542 Cervicalgia: Secondary | ICD-10-CM | POA: Diagnosis not present

## 2018-11-30 DIAGNOSIS — E1100 Type 2 diabetes mellitus with hyperosmolarity without nonketotic hyperglycemic-hyperosmolar coma (NKHHC): Secondary | ICD-10-CM | POA: Diagnosis not present

## 2018-11-30 DIAGNOSIS — I10 Essential (primary) hypertension: Secondary | ICD-10-CM

## 2018-11-30 DIAGNOSIS — Z13220 Encounter for screening for lipoid disorders: Secondary | ICD-10-CM

## 2018-11-30 DIAGNOSIS — Z79899 Other long term (current) drug therapy: Secondary | ICD-10-CM

## 2018-11-30 MED ORDER — MONTELUKAST SODIUM 10 MG PO TABS: 10.0000 mg | ORAL_TABLET | Freq: Every day | ORAL | 5 refills | Status: DC

## 2018-11-30 MED ORDER — HYDROCHLOROTHIAZIDE 25 MG PO TABS: 25.0000 mg | ORAL_TABLET | Freq: Every day | ORAL | 1 refills | Status: DC

## 2018-11-30 MED ORDER — CYCLOBENZAPRINE HCL 10 MG PO TABS: ORAL_TABLET | ORAL | 3 refills | Status: DC

## 2018-11-30 MED ORDER — AMLODIPINE BESYLATE 5 MG PO TABS: ORAL_TABLET | ORAL | 1 refills | Status: DC

## 2018-11-30 MED ORDER — POTASSIUM CHLORIDE CRYS ER 20 MEQ PO TBCR: EXTENDED_RELEASE_TABLET | ORAL | 1 refills | Status: DC

## 2018-11-30 MED ORDER — CITALOPRAM HYDROBROMIDE 40 MG PO TABS: 40.0000 mg | ORAL_TABLET | Freq: Every day | ORAL | 1 refills | Status: DC

## 2018-11-30 NOTE — Progress Notes (Signed)
   Subjective:  Patient arrives with numerous concerns  Patient ID: Dennis Mitchell, male    DOB: 1950-01-25, 68 y.o.   MRN: [id number]  HPI Pt here today for follow up on left arm pain. Pt states he has been doing better. Certain neck movements make pts arm tingle. Pt states he has decreased strength and has pain when trying to raise arm above head.   Patient claims compliance with diabetes medication. No obvious side effects. Reports no substantial low sugar spells. Most numbers are generally in good range when checked fasting. Generally does not miss a dose of medication. Watching diabetic diet closely   Blood pressure medicine and blood pressure levels reviewed today with patient. Compliant with blood pressure medicine. States does not miss a dose. No obvious side effects. Blood pressure generally good when checked elsewhere. Watching salt intake.   Patient notes ongoing compliance with antidepressant medication. No obvious side effects. Reports does not miss a dose. Overall continues to help depression substantially. No thoughts of homicide or suicide. Would like to maintain medication.  Walking some    Exercise of and on   Does nt ck sugars    Review of Systems No headache, no major weight loss or weight gain, no chest pain no back pain abdominal pain no change in bowel habits complete ROS otherwise negative     Objective:   Physical Exam  Alert and oriented, vitals reviewed and stable, NAD ENT-TM's and ext canals WNL bilat via otoscopic exam Soft palate, tonsils and post pharynx WNL via oropharyngeal exam Neck-symmetric, no masses; thyroid nonpalpable and nontender Pulmonary-no tachypnea or accessory muscle use; Clear without wheezes via auscultation Card--no abnrml murmurs, rhythm reg and rate WNL Carotid pulses symmetric, without bruits Left hand grip diminished.  Some sensory intention diminishment lateral hand.  Positive emergence of neuropathic pain when patient  slightly turns his head and neck to the left.  See diabetic foot exam      Assessment & Plan:  Impression type 2 diabetes.  123456 uncertain will draw level.  Diet discussed  2.  Hypertension blood pressure suboptimal control.  Discussed.  Increase Norvasc to 5 mg daily  3.  Depression clinically stable to maintain same meds  4.  Worsening cervical neuropathy.  Severe in nature.  Accompanied by neuropathic pain.  Accompanied by left hand grip and finger splaying weakness.  Diminished sensory exam.  Pain occurs with slight motion of cervical spine when sitting.  This all after prednisone.  Definitely needs MRI cervical spine.  Rationale discussed.  Appropriate blood work.  Cervical MRI.  Diet exercise discussed.  Follow-up in 6 months anticipate probable referral based on cervical MRI results discussed

## 2018-12-06 DIAGNOSIS — E1100 Type 2 diabetes mellitus with hyperosmolarity without nonketotic hyperglycemic-hyperosmolar coma (NKHHC): Secondary | ICD-10-CM | POA: Diagnosis not present

## 2018-12-06 DIAGNOSIS — Z13220 Encounter for screening for lipoid disorders: Secondary | ICD-10-CM | POA: Diagnosis not present

## 2018-12-06 DIAGNOSIS — Z79899 Other long term (current) drug therapy: Secondary | ICD-10-CM | POA: Diagnosis not present

## 2018-12-06 DIAGNOSIS — I10 Essential (primary) hypertension: Secondary | ICD-10-CM | POA: Diagnosis not present

## 2018-12-07 ENCOUNTER — Other Ambulatory Visit: Payer: Self-pay

## 2018-12-07 ENCOUNTER — Ambulatory Visit (HOSPITAL_COMMUNITY)
Admission: RE | Admit: 2018-12-07 | Discharge: 2018-12-07 | Disposition: A | Discharge status: Home / Self Care | Payer: Medicare Other | Source: Ambulatory Visit | Attending: Family Medicine | Admitting: Family Medicine

## 2018-12-07 DIAGNOSIS — M542 Cervicalgia: Secondary | ICD-10-CM | POA: Diagnosis not present

## 2018-12-07 LAB — HEMOGLOBIN A1C
Est. average glucose Bld gHb Est-mCnc: 128 mg/dL
Hgb A1c MFr Bld: 6.1 % — ABNORMAL HIGH (ref 4.8–5.6)

## 2018-12-07 LAB — BASIC METABOLIC PANEL
BUN/Creatinine Ratio: 20 (ref 10–24)
BUN: 18 mg/dL (ref 8–27)
CO2: 26 mmol/L (ref 20–29)
Calcium: 8.9 mg/dL (ref 8.6–10.2)
Chloride: 90 mmol/L — ABNORMAL LOW (ref 96–106)
Creatinine, Ser: 0.91 mg/dL (ref 0.76–1.27)
GFR calc Af Amer: 100 mL/min/{1.73_m2} (ref >59)
GFR calc non Af Amer: 86 mL/min/{1.73_m2} (ref >59)
Glucose: 121 mg/dL — ABNORMAL HIGH (ref 65–99)
Potassium: 3.8 mmol/L (ref 3.5–5.2)
Sodium: 131 mmol/L — ABNORMAL LOW (ref 134–144)

## 2018-12-07 LAB — LIPID PANEL
Chol/HDL Ratio: 2.4 ratio (ref 0.0–5.0)
Cholesterol, Total: 159 mg/dL (ref 100–199)
HDL: 65 mg/dL (ref >39)
LDL Chol Calc (NIH): 82 mg/dL (ref 0–99)
Triglycerides: 61 mg/dL (ref 0–149)
VLDL Cholesterol Cal: 12 mg/dL (ref 5–40)

## 2018-12-07 LAB — HEPATIC FUNCTION PANEL
ALT: 17 IU/L (ref 0–44)
AST: 16 IU/L (ref 0–40)
Albumin: 3.6 g/dL — ABNORMAL LOW (ref 3.8–4.8)
Alkaline Phosphatase: 64 IU/L (ref 39–117)
Bilirubin Total: 0.7 mg/dL (ref 0.0–1.2)
Bilirubin, Direct: 0.23 mg/dL (ref 0.00–0.40)
Total Protein: 6.7 g/dL (ref 6.0–8.5)

## 2018-12-07 LAB — MICROALBUMIN / CREATININE URINE RATIO
Creatinine, Urine: 62 mg/dL
Microalb/Creat Ratio: 111 mg/g creat — ABNORMAL HIGH (ref 0–29)
Microalbumin, Urine: 69 ug/mL

## 2018-12-08 ENCOUNTER — Encounter: Payer: Self-pay | Admitting: Family Medicine

## 2018-12-09 NOTE — Addendum Note (Signed)
Addended by: Vicente Males on: 12/09/2018 01:26 PM   Modules accepted: Orders

## 2018-12-12 ENCOUNTER — Encounter: Payer: Self-pay | Admitting: Family Medicine

## 2018-12-14 ENCOUNTER — Encounter: Payer: Self-pay | Admitting: Family Medicine

## 2018-12-15 DIAGNOSIS — G992 Myelopathy in diseases classified elsewhere: Secondary | ICD-10-CM | POA: Diagnosis not present

## 2018-12-15 DIAGNOSIS — M4802 Spinal stenosis, cervical region: Secondary | ICD-10-CM | POA: Diagnosis not present

## 2018-12-19 ENCOUNTER — Other Ambulatory Visit: Payer: Self-pay | Admitting: Neurosurgery

## 2018-12-21 NOTE — Progress Notes (Signed)
[place], [place] - [id number] [place] [address] [id number] [place] [address] [place] [address] Phone: [phone] Fax: [phone]      Your procedure is scheduled on December 14  Report to [facility] Main Entrance "A" at 0800 A.M., and check in at the Admitting office.  Call this number if you have problems the morning of surgery:  [phone]  Call [phone] if you have any questions prior to your surgery date Monday-Friday 8am-4pm    Remember:  Do not eat or drink after midnight the night before your surgery    Take these medicines the morning of surgery with A SIP OF WATER  amLODipine (NORVASC)  citalopram (CELEXA) cyclobenzaprine (FLEXERIL) if needed hydrOXYzine (VISTARIL) if needed LORazepam (ATIVAN)  If needed meclizine (ANTIVERT) if needed oxyCODONE-acetaminophen (PERCOCET/ROXICET) if needed   7 days prior to surgery STOP taking any Aspirin (unless otherwise instructed by your surgeon), Aleve, Naproxen, Ibuprofen, Motrin, Advil, Goody's, BC's, all herbal medications, fish oil, and all vitamins. diclofenac sodium (VOLTAREN) 1 % GEL    The Morning of Surgery  Do not wear jewelry  Do not wear lotions, powders, or colognes, or deodorant  Men may shave face and neck.  Do not bring valuables to the hospital.  [facility] is not responsible for any belongings or valuables.  If you are a smoker, DO NOT Smoke 24 hours prior to surgery  If you wear a CPAP at night please bring your mask, tubing, and machine the morning of surgery   Remember that you must have someone to transport you home after your surgery, and remain with you for 24 hours if you are discharged the same day.   Please bring cases for contacts, glasses, hearing aids, dentures or bridgework because it cannot be worn into surgery.    Leave your suitcase in the car.  After surgery it may be brought to your room.  For patients admitted to the hospital, discharge time will be determined by  your treatment team.  Patients discharged the day of surgery will not be allowed to drive home.    Special instructions:   [facility]- Preparing For Surgery  Before surgery, you can play an important role. Because skin is not sterile, your skin needs to be as free of germs as possible. You can reduce the number of germs on your skin by washing with CHG (chlorahexidine gluconate) Soap before surgery.  CHG is an antiseptic cleaner which kills germs and bonds with the skin to continue killing germs even after washing.    Oral Hygiene is also important to reduce your risk of infection.  Remember - BRUSH YOUR TEETH THE MORNING OF SURGERY WITH YOUR REGULAR TOOTHPASTE  Please do not use if you have an allergy to CHG or antibacterial soaps. If your skin becomes reddened/irritated stop using the CHG.  Do not shave (including legs and underarms) for at least 48 hours prior to first CHG shower. It is OK to shave your face.  Please follow these instructions carefully.   1. Shower the NIGHT BEFORE SURGERY and the MORNING OF SURGERY with CHG Soap.   2. If you chose to wash your hair, wash your hair first as usual with your normal shampoo.  3. After you shampoo, rinse your hair and body thoroughly to remove the shampoo.  4. Use CHG as you would any other liquid soap. You can apply CHG directly to the skin and wash gently with a scrungie or a clean washcloth.  5. Apply the CHG Soap to your body ONLY FROM THE NECK DOWN.  Do not use on open wounds or open sores. Avoid contact with your eyes, ears, mouth and genitals (private parts). Wash Face and genitals (private parts)  with your normal soap.   6. Wash thoroughly, paying special attention to the area where your surgery will be performed.  7. Thoroughly rinse your body with warm water from the neck down.  8. DO NOT shower/wash with your normal soap after using and rinsing off the CHG Soap.  9. Pat yourself dry with a CLEAN TOWEL.  10. Wear CLEAN  PAJAMAS to bed the night before surgery, wear comfortable clothes the morning of surgery  11. Place CLEAN SHEETS on your bed the night of your first shower and DO NOT SLEEP WITH PETS.    Day of Surgery:  Please shower the morning of surgery with the CHG soap Do not apply any deodorants/lotions. Please wear clean clothes to the hospital/surgery center.   Remember to brush your teeth WITH YOUR REGULAR TOOTHPASTE.   Please read over the following fact sheets that you were given.

## 2018-12-22 ENCOUNTER — Encounter (HOSPITAL_COMMUNITY)
Admission: RE | Admit: 2018-12-22 | Discharge: 2018-12-22 | Disposition: A | Discharge status: Home / Self Care | Payer: Medicare Other | Source: Ambulatory Visit | Attending: Neurosurgery | Admitting: Neurosurgery

## 2018-12-22 ENCOUNTER — Other Ambulatory Visit: Payer: Self-pay

## 2018-12-22 ENCOUNTER — Other Ambulatory Visit (HOSPITAL_COMMUNITY)
Admission: RE | Admit: 2018-12-22 | Discharge: 2018-12-22 | Disposition: A | Discharge status: Home / Self Care | Payer: Medicare Other | Source: Ambulatory Visit | Attending: Neurosurgery | Admitting: Neurosurgery

## 2018-12-22 ENCOUNTER — Encounter (HOSPITAL_COMMUNITY): Payer: Self-pay

## 2018-12-22 DIAGNOSIS — Z01812 Encounter for preprocedural laboratory examination: Secondary | ICD-10-CM | POA: Insufficient documentation

## 2018-12-22 DIAGNOSIS — M4802 Spinal stenosis, cervical region: Secondary | ICD-10-CM | POA: Diagnosis not present

## 2018-12-22 DIAGNOSIS — Z20828 Contact with and (suspected) exposure to other viral communicable diseases: Secondary | ICD-10-CM | POA: Insufficient documentation

## 2018-12-22 DIAGNOSIS — Z01810 Encounter for preprocedural cardiovascular examination: Secondary | ICD-10-CM | POA: Insufficient documentation

## 2018-12-22 LAB — CBC
HCT: 46.9 % (ref 39.0–52.0)
Hemoglobin: 15.9 g/dL (ref 13.0–17.0)
MCH: 30.7 pg (ref 26.0–34.0)
MCHC: 33.9 g/dL (ref 30.0–36.0)
MCV: 90.5 fL (ref 80.0–100.0)
Platelets: 243 10*3/uL (ref 150–400)
RBC: 5.18 MIL/uL (ref 4.22–5.81)
RDW: 12.2 % (ref 11.5–15.5)
WBC: 7.3 10*3/uL (ref 4.0–10.5)
nRBC: 0 % (ref 0.0–0.2)

## 2018-12-22 LAB — BASIC METABOLIC PANEL
Anion gap: 7 (ref 5–15)
BUN: 20 mg/dL (ref 8–23)
CO2: 30 mmol/L (ref 22–32)
Calcium: 8.9 mg/dL (ref 8.9–10.3)
Chloride: 97 mmol/L — ABNORMAL LOW (ref 98–111)
Creatinine, Ser: 1.03 mg/dL (ref 0.61–1.24)
GFR calc Af Amer: >60 mL/min (ref >60)
GFR calc non Af Amer: >60 mL/min (ref >60)
Glucose, Bld: 116 mg/dL — ABNORMAL HIGH (ref 70–99)
Potassium: 3.6 mmol/L (ref 3.5–5.1)
Sodium: 134 mmol/L — ABNORMAL LOW (ref 135–145)

## 2018-12-22 LAB — SARS CORONAVIRUS 2 (TAT 6-24 HRS): SARS Coronavirus 2: NEGATIVE

## 2018-12-22 LAB — SURGICAL PCR SCREEN
MRSA, PCR: NEGATIVE
Staphylococcus aureus: NEGATIVE

## 2018-12-22 LAB — GLUCOSE, CAPILLARY: Glucose-Capillary: 105 mg/dL — ABNORMAL HIGH (ref 70–99)

## 2018-12-22 NOTE — Progress Notes (Addendum)
PCP - Wolfgang Phoenix Cardiologist - denies  PPM/ICD - n/a Device Orders -  Rep Notified -   Chest x-ray - n/a EKG - today Stress Test - pt. Denies all advanced cardiac testing  ECHO -  Cardiac Cath -   Sleep Study - several yrs. ago CPAP - has device, uses only a couple times per week. Reports that since his gastric sleeve, needs it less   Fasting Blood Sugar - 105 Checks Blood Sugar _none____ times a day Pt. Last A1c- 6.1 on 12/06/2018, nolonger taking med. Since gastric sleeve  Blood Thinner Instructions:n/a Aspirin Instructions:n/a  ERAS Protcol -n/a PRE-SURGERY Ensure or G2-   COVID TEST- today in [place]  BP redone: 183/85, pt. Reports that he took his meds today but just feels anxious about being in the medical atmosphere. States that he is feeling stressed re: bld. Draw & also the impending surgery.  Also gives pain score of 8 & pt. Was restless with his arm during the PAT visit.   Anesthesia review: not needed at this point  Patient denies shortness of breath, fever, cough and chest pain at PAT appointment   All instructions explained to the patient, with a verbal understanding of the material. Patient agrees to go over the instructions while at home for a better understanding. Patient also instructed to self quarantine after being tested for COVID-19. The opportunity to ask questions was provided.

## 2018-12-23 LAB — SPECIMEN STATUS REPORT

## 2018-12-23 MED ORDER — VANCOMYCIN HCL IN DEXTROSE 1-5 GM/200ML-% IV SOLN: 1000.0000 mg | INTRAVENOUS | Status: DC

## 2018-12-23 MED ORDER — VANCOMYCIN HCL 10 G IV SOLR
1500.0000 mg | INTRAVENOUS | Status: AC
  Administered 2018-12-26: 11:00:00 1500 mg via INTRAVENOUS
  Filled 2018-12-23: qty 1500

## 2018-12-26 ENCOUNTER — Other Ambulatory Visit: Payer: Self-pay

## 2018-12-26 ENCOUNTER — Inpatient Hospital Stay (HOSPITAL_COMMUNITY): Payer: Medicare Other | Admitting: Certified Registered Nurse Anesthetist

## 2018-12-26 ENCOUNTER — Inpatient Hospital Stay (HOSPITAL_COMMUNITY): Payer: Medicare Other

## 2018-12-26 ENCOUNTER — Inpatient Hospital Stay (HOSPITAL_COMMUNITY)
Admission: RE | Admit: 2018-12-26 | Discharge: 2018-12-27 | DRG: 473 | Disposition: A | Discharge status: Home / Self Care | Payer: Medicare Other | Attending: Neurosurgery | Admitting: Neurosurgery

## 2018-12-26 ENCOUNTER — Encounter (HOSPITAL_COMMUNITY): Payer: Self-pay | Admitting: Neurosurgery

## 2018-12-26 ENCOUNTER — Encounter (HOSPITAL_COMMUNITY)
Admission: RE | Disposition: A | Discharge status: Home / Self Care | Payer: Self-pay | Source: Home / Self Care | Attending: Neurosurgery

## 2018-12-26 DIAGNOSIS — G4730 Sleep apnea, unspecified: Secondary | ICD-10-CM | POA: Diagnosis not present

## 2018-12-26 DIAGNOSIS — Z85828 Personal history of other malignant neoplasm of skin: Secondary | ICD-10-CM

## 2018-12-26 DIAGNOSIS — M4722 Other spondylosis with radiculopathy, cervical region: Secondary | ICD-10-CM | POA: Diagnosis present

## 2018-12-26 DIAGNOSIS — Z91030 Bee allergy status: Secondary | ICD-10-CM | POA: Diagnosis not present

## 2018-12-26 DIAGNOSIS — F17210 Nicotine dependence, cigarettes, uncomplicated: Secondary | ICD-10-CM | POA: Diagnosis present

## 2018-12-26 DIAGNOSIS — Z79891 Long term (current) use of opiate analgesic: Secondary | ICD-10-CM | POA: Diagnosis not present

## 2018-12-26 DIAGNOSIS — M4712 Other spondylosis with myelopathy, cervical region: Secondary | ICD-10-CM | POA: Diagnosis not present

## 2018-12-26 DIAGNOSIS — Z7982 Long term (current) use of aspirin: Secondary | ICD-10-CM

## 2018-12-26 DIAGNOSIS — Z20828 Contact with and (suspected) exposure to other viral communicable diseases: Secondary | ICD-10-CM | POA: Diagnosis present

## 2018-12-26 DIAGNOSIS — Z91018 Allergy to other foods: Secondary | ICD-10-CM | POA: Diagnosis not present

## 2018-12-26 DIAGNOSIS — F329 Major depressive disorder, single episode, unspecified: Secondary | ICD-10-CM | POA: Diagnosis present

## 2018-12-26 DIAGNOSIS — M542 Cervicalgia: Secondary | ICD-10-CM | POA: Diagnosis present

## 2018-12-26 DIAGNOSIS — E7800 Pure hypercholesterolemia, unspecified: Secondary | ICD-10-CM | POA: Diagnosis not present

## 2018-12-26 DIAGNOSIS — Z6836 Body mass index (BMI) 36.0-36.9, adult: Secondary | ICD-10-CM

## 2018-12-26 DIAGNOSIS — R2689 Other abnormalities of gait and mobility: Secondary | ICD-10-CM | POA: Diagnosis not present

## 2018-12-26 DIAGNOSIS — Z880 Allergy status to penicillin: Secondary | ICD-10-CM

## 2018-12-26 DIAGNOSIS — M47812 Spondylosis without myelopathy or radiculopathy, cervical region: Secondary | ICD-10-CM | POA: Insufficient documentation

## 2018-12-26 DIAGNOSIS — F419 Anxiety disorder, unspecified: Secondary | ICD-10-CM | POA: Diagnosis present

## 2018-12-26 DIAGNOSIS — G9520 Unspecified cord compression: Secondary | ICD-10-CM | POA: Diagnosis not present

## 2018-12-26 DIAGNOSIS — Z79899 Other long term (current) drug therapy: Secondary | ICD-10-CM | POA: Diagnosis not present

## 2018-12-26 DIAGNOSIS — M50021 Cervical disc disorder at C4-C5 level with myelopathy: Secondary | ICD-10-CM | POA: Diagnosis not present

## 2018-12-26 DIAGNOSIS — Z9884 Bariatric surgery status: Secondary | ICD-10-CM

## 2018-12-26 DIAGNOSIS — G992 Myelopathy in diseases classified elsewhere: Secondary | ICD-10-CM | POA: Diagnosis not present

## 2018-12-26 DIAGNOSIS — M4322 Fusion of spine, cervical region: Secondary | ICD-10-CM | POA: Diagnosis not present

## 2018-12-26 DIAGNOSIS — E119 Type 2 diabetes mellitus without complications: Secondary | ICD-10-CM | POA: Diagnosis not present

## 2018-12-26 DIAGNOSIS — M4802 Spinal stenosis, cervical region: Secondary | ICD-10-CM | POA: Diagnosis not present

## 2018-12-26 DIAGNOSIS — Z888 Allergy status to other drugs, medicaments and biological substances status: Secondary | ICD-10-CM

## 2018-12-26 DIAGNOSIS — Z419 Encounter for procedure for purposes other than remedying health state, unspecified: Secondary | ICD-10-CM

## 2018-12-26 DIAGNOSIS — I10 Essential (primary) hypertension: Secondary | ICD-10-CM | POA: Diagnosis present

## 2018-12-26 HISTORY — PX: ANTERIOR CERVICAL DECOMP/DISCECTOMY FUSION: SHX1161

## 2018-12-26 LAB — GLUCOSE, CAPILLARY
Glucose-Capillary: 121 mg/dL — ABNORMAL HIGH (ref 70–99)
Glucose-Capillary: 143 mg/dL — ABNORMAL HIGH (ref 70–99)
Glucose-Capillary: 149 mg/dL — ABNORMAL HIGH (ref 70–99)
Glucose-Capillary: 270 mg/dL — ABNORMAL HIGH (ref 70–99)

## 2018-12-26 SURGERY — ANTERIOR CERVICAL DECOMPRESSION/DISCECTOMY FUSION 3 LEVELS
Anesthesia: General | Site: Spine Cervical

## 2018-12-26 MED ORDER — DICLOFENAC SODIUM 1 % TD GEL: 2.0000 g | Freq: Two times a day (BID) | TRANSDERMAL | Status: DC | PRN

## 2018-12-26 MED ORDER — GLYCOPYRROLATE PF 0.2 MG/ML IJ SOSY
PREFILLED_SYRINGE | INTRAMUSCULAR | Status: AC
  Filled 2018-12-26: qty 1

## 2018-12-26 MED ORDER — PHENOL 1.4 % MT LIQD
1.0000 | OROMUCOSAL | Status: DC | PRN
  Administered 2018-12-27: 1 via OROMUCOSAL
  Filled 2018-12-26: qty 177

## 2018-12-26 MED ORDER — LORAZEPAM 0.5 MG PO TABS: 2.0000 mg | ORAL_TABLET | Freq: Every evening | ORAL | Status: DC | PRN

## 2018-12-26 MED ORDER — SUGAMMADEX SODIUM 200 MG/2ML IV SOLN
INTRAVENOUS | Status: DC | PRN
  Administered 2018-12-26: 200 mg via INTRAVENOUS

## 2018-12-26 MED ORDER — ACETAMINOPHEN 650 MG RE SUPP: 650.0000 mg | RECTAL | Status: DC | PRN

## 2018-12-26 MED ORDER — POTASSIUM 99 MG PO TABS: 99.0000 mg | ORAL_TABLET | Freq: Every day | ORAL | Status: DC

## 2018-12-26 MED ORDER — LACTATED RINGERS IV SOLN
INTRAVENOUS | Status: DC
  Administered 2018-12-26 (×2): via INTRAVENOUS

## 2018-12-26 MED ORDER — SODIUM CHLORIDE 0.9 % IV SOLN
250.0000 mL | INTRAVENOUS | Status: DC
  Administered 2018-12-26: 17:00:00 250 mL via INTRAVENOUS

## 2018-12-26 MED ORDER — VANCOMYCIN HCL 10 G IV SOLR
1500.0000 mg | Freq: Once | INTRAVENOUS | Status: DC
  Filled 2018-12-26: qty 1500

## 2018-12-26 MED ORDER — SODIUM CHLORIDE 0.9 % IV SOLN
INTRAVENOUS | Status: DC | PRN
  Administered 2018-12-26: 500 mL

## 2018-12-26 MED ORDER — THROMBIN 20000 UNITS EX SOLR
CUTANEOUS | Status: AC
  Filled 2018-12-26: qty 20000

## 2018-12-26 MED ORDER — HYDROMORPHONE HCL 1 MG/ML IJ SOLN: 1.0000 mg | INTRAMUSCULAR | Status: DC | PRN

## 2018-12-26 MED ORDER — AMLODIPINE BESYLATE 5 MG PO TABS
5.0000 mg | ORAL_TABLET | Freq: Every day | ORAL | Status: DC
  Administered 2018-12-27: 5 mg via ORAL
  Filled 2018-12-26: qty 1

## 2018-12-26 MED ORDER — CHLORHEXIDINE GLUCONATE CLOTH 2 % EX PADS: 6.0000 | MEDICATED_PAD | Freq: Once | CUTANEOUS | Status: DC

## 2018-12-26 MED ORDER — HYDROCHLOROTHIAZIDE 25 MG PO TABS
25.0000 mg | ORAL_TABLET | Freq: Every day | ORAL | Status: DC
  Administered 2018-12-26 – 2018-12-27 (×2): 25 mg via ORAL
  Filled 2018-12-26 (×2): qty 1

## 2018-12-26 MED ORDER — FENTANYL CITRATE (PF) 250 MCG/5ML IJ SOLN
INTRAMUSCULAR | Status: DC | PRN
  Administered 2018-12-26: 50 ug via INTRAVENOUS
  Administered 2018-12-26 (×2): 100 ug via INTRAVENOUS

## 2018-12-26 MED ORDER — ONDANSETRON HCL 4 MG/2ML IJ SOLN: 4.0000 mg | Freq: Four times a day (QID) | INTRAMUSCULAR | Status: DC | PRN

## 2018-12-26 MED ORDER — HYDROMORPHONE HCL 1 MG/ML IJ SOLN
INTRAMUSCULAR | Status: AC
  Administered 2018-12-26: 0.5 mg via INTRAVENOUS
  Filled 2018-12-26: qty 1

## 2018-12-26 MED ORDER — THROMBIN 5000 UNITS EX SOLR
OROMUCOSAL | Status: DC | PRN
  Administered 2018-12-26: 5 mL

## 2018-12-26 MED ORDER — PHENYLEPHRINE HCL-NACL 10-0.9 MG/250ML-% IV SOLN
INTRAVENOUS | Status: DC | PRN
  Administered 2018-12-26: 20 ug/min via INTRAVENOUS

## 2018-12-26 MED ORDER — ALBUTEROL SULFATE HFA 108 (90 BASE) MCG/ACT IN AERS
INHALATION_SPRAY | RESPIRATORY_TRACT | Status: AC
  Filled 2018-12-26: qty 6.7

## 2018-12-26 MED ORDER — OXYCODONE HCL 5 MG PO TABS
ORAL_TABLET | ORAL | Status: AC
  Administered 2018-12-26: 15:00:00 10 mg
  Filled 2018-12-26: qty 2

## 2018-12-26 MED ORDER — HYDROMORPHONE HCL 1 MG/ML IJ SOLN
0.2500 mg | INTRAMUSCULAR | Status: DC | PRN
  Administered 2018-12-26: 15:00:00 0.5 mg via INTRAVENOUS

## 2018-12-26 MED ORDER — FENTANYL CITRATE (PF) 250 MCG/5ML IJ SOLN
INTRAMUSCULAR | Status: AC
  Filled 2018-12-26: qty 5

## 2018-12-26 MED ORDER — MIDAZOLAM HCL 2 MG/2ML IJ SOLN
INTRAMUSCULAR | Status: DC | PRN
  Administered 2018-12-26: 2 mg via INTRAVENOUS

## 2018-12-26 MED ORDER — CYCLOBENZAPRINE HCL 10 MG PO TABS
ORAL_TABLET | ORAL | Status: AC
  Administered 2018-12-26: 15:00:00 10 mg via ORAL
  Filled 2018-12-26: qty 1

## 2018-12-26 MED ORDER — MIDAZOLAM HCL 2 MG/2ML IJ SOLN
INTRAMUSCULAR | Status: AC
  Filled 2018-12-26: qty 2

## 2018-12-26 MED ORDER — GLYCOPYRROLATE PF 0.2 MG/ML IJ SOSY
PREFILLED_SYRINGE | INTRAMUSCULAR | Status: DC | PRN
  Administered 2018-12-26: .1 mg via INTRAVENOUS

## 2018-12-26 MED ORDER — THROMBIN 20000 UNITS EX SOLR
CUTANEOUS | Status: DC | PRN
  Administered 2018-12-26: 20 mL

## 2018-12-26 MED ORDER — ALBUTEROL SULFATE HFA 108 (90 BASE) MCG/ACT IN AERS
INHALATION_SPRAY | RESPIRATORY_TRACT | Status: DC | PRN
  Administered 2018-12-26: 5 via RESPIRATORY_TRACT

## 2018-12-26 MED ORDER — HYDROCODONE-ACETAMINOPHEN 10-325 MG PO TABS
2.0000 | ORAL_TABLET | ORAL | Status: DC | PRN
  Administered 2018-12-26 – 2018-12-27 (×5): 2 via ORAL
  Filled 2018-12-26 (×5): qty 2

## 2018-12-26 MED ORDER — MEPERIDINE HCL 25 MG/ML IJ SOLN: 6.2500 mg | INTRAMUSCULAR | Status: DC | PRN

## 2018-12-26 MED ORDER — SODIUM CHLORIDE 0.9% FLUSH: 3.0000 mL | INTRAVENOUS | Status: DC | PRN

## 2018-12-26 MED ORDER — PROMETHAZINE HCL 25 MG/ML IJ SOLN: 6.2500 mg | INTRAMUSCULAR | Status: DC | PRN

## 2018-12-26 MED ORDER — HYDROCODONE-ACETAMINOPHEN 5-325 MG PO TABS: 1.0000 | ORAL_TABLET | ORAL | Status: DC | PRN

## 2018-12-26 MED ORDER — ROCURONIUM BROMIDE 10 MG/ML (PF) SYRINGE
PREFILLED_SYRINGE | INTRAVENOUS | Status: DC | PRN
  Administered 2018-12-26: 60 mg via INTRAVENOUS
  Administered 2018-12-26: 20 mg via INTRAVENOUS

## 2018-12-26 MED ORDER — CYCLOBENZAPRINE HCL 10 MG PO TABS
10.0000 mg | ORAL_TABLET | Freq: Three times a day (TID) | ORAL | Status: DC | PRN
  Administered 2018-12-26: 22:00:00 10 mg via ORAL
  Filled 2018-12-26: qty 1

## 2018-12-26 MED ORDER — THROMBIN 5000 UNITS EX SOLR
CUTANEOUS | Status: AC
  Filled 2018-12-26: qty 5000

## 2018-12-26 MED ORDER — PROPOFOL 10 MG/ML IV BOLUS
INTRAVENOUS | Status: DC | PRN
  Administered 2018-12-26: 150 mg via INTRAVENOUS

## 2018-12-26 MED ORDER — ACETAMINOPHEN 325 MG PO TABS: 650.0000 mg | ORAL_TABLET | ORAL | Status: DC | PRN

## 2018-12-26 MED ORDER — CITALOPRAM HYDROBROMIDE 40 MG PO TABS
40.0000 mg | ORAL_TABLET | Freq: Every day | ORAL | Status: DC
  Administered 2018-12-27: 40 mg via ORAL
  Filled 2018-12-26: qty 1

## 2018-12-26 MED ORDER — VANCOMYCIN HCL 10 G IV SOLR
1500.0000 mg | Freq: Once | INTRAVENOUS | Status: AC
  Administered 2018-12-26: 1500 mg via INTRAVENOUS
  Filled 2018-12-26 (×2): qty 1500

## 2018-12-26 MED ORDER — LIDOCAINE 2% (20 MG/ML) 5 ML SYRINGE
INTRAMUSCULAR | Status: DC | PRN
  Administered 2018-12-26: 40 mg via INTRAVENOUS
  Administered 2018-12-26: 20 mg via INTRAVENOUS

## 2018-12-26 MED ORDER — DEXAMETHASONE SODIUM PHOSPHATE 10 MG/ML IJ SOLN
10.0000 mg | Freq: Once | INTRAMUSCULAR | Status: AC
  Administered 2018-12-26: 10 mg via INTRAVENOUS
  Filled 2018-12-26: qty 1

## 2018-12-26 MED ORDER — HYDROXYZINE PAMOATE 25 MG PO CAPS: 25.0000 mg | ORAL_CAPSULE | Freq: Four times a day (QID) | ORAL | Status: DC | PRN

## 2018-12-26 MED ORDER — MIDAZOLAM HCL 2 MG/2ML IJ SOLN: 0.5000 mg | Freq: Once | INTRAMUSCULAR | Status: DC | PRN

## 2018-12-26 MED ORDER — HYDROXYZINE HCL 25 MG PO TABS: 25.0000 mg | ORAL_TABLET | Freq: Four times a day (QID) | ORAL | Status: DC | PRN

## 2018-12-26 MED ORDER — PROPOFOL 10 MG/ML IV BOLUS
INTRAVENOUS | Status: AC
  Filled 2018-12-26: qty 40

## 2018-12-26 MED ORDER — SODIUM CHLORIDE 0.9% FLUSH
3.0000 mL | Freq: Two times a day (BID) | INTRAVENOUS | Status: DC
  Administered 2018-12-26: 23:00:00 3 mL via INTRAVENOUS

## 2018-12-26 MED ORDER — ONDANSETRON HCL 4 MG PO TABS: 4.0000 mg | ORAL_TABLET | Freq: Four times a day (QID) | ORAL | Status: DC | PRN

## 2018-12-26 MED ORDER — MENTHOL 3 MG MT LOZG
1.0000 | LOZENGE | OROMUCOSAL | Status: DC | PRN
  Filled 2018-12-26: qty 9

## 2018-12-26 MED ORDER — 0.9 % SODIUM CHLORIDE (POUR BTL) OPTIME
TOPICAL | Status: DC | PRN
  Administered 2018-12-26: 1000 mL

## 2018-12-26 MED ORDER — EPINEPHRINE 0.3 MG/0.3ML IJ DEVI: 0.3000 mg | Freq: Once | INTRAMUSCULAR | Status: DC

## 2018-12-26 MED ORDER — ONDANSETRON HCL 4 MG/2ML IJ SOLN
INTRAMUSCULAR | Status: DC | PRN
  Administered 2018-12-26: 4 mg via INTRAVENOUS

## 2018-12-26 MED ORDER — MECLIZINE HCL 25 MG PO TABS
25.0000 mg | ORAL_TABLET | Freq: Every day | ORAL | Status: DC
  Administered 2018-12-27: 25 mg via ORAL
  Filled 2018-12-26: qty 1

## 2018-12-26 SURGICAL SUPPLY — 57 items
BAG DECANTER FOR FLEXI CONT (MISCELLANEOUS) ×2 IMPLANT
BENZOIN TINCTURE PRP APPL 2/3 (GAUZE/BANDAGES/DRESSINGS) ×2 IMPLANT
BIT DRILL 13 (BIT) ×2 IMPLANT
BUR MATCHSTICK NEURO 3.0 LAGG (BURR) ×2 IMPLANT
CAGE PEEK 10X14X11 (Cage) ×1 IMPLANT
CAGE PEEK 6X14X11 (Cage) ×1 IMPLANT
CAGE PEEK 7X14X11 (Cage) ×1 IMPLANT
CANISTER SUCT 3000ML PPV (MISCELLANEOUS) ×2 IMPLANT
CARTRIDGE OIL MAESTRO DRILL (MISCELLANEOUS) ×1 IMPLANT
COVER WAND RF STERILE (DRAPES) ×2 IMPLANT
DIFFUSER DRILL AIR PNEUMATIC (MISCELLANEOUS) ×2 IMPLANT
DRAPE C-ARM 42X72 X-RAY (DRAPES) ×4 IMPLANT
DRAPE LAPAROTOMY 100X72 PEDS (DRAPES) ×2 IMPLANT
DRAPE MICROSCOPE LEICA (MISCELLANEOUS) ×2 IMPLANT
DURAPREP 6ML APPLICATOR 50/CS (WOUND CARE) ×2 IMPLANT
ELECT COATED BLADE 2.86 ST (ELECTRODE) ×2 IMPLANT
ELECT REM PT RETURN 9FT ADLT (ELECTROSURGICAL) ×2
ELECTRODE REM PT RTRN 9FT ADLT (ELECTROSURGICAL) ×1 IMPLANT
GAUZE 4X4 16PLY RFD (DISPOSABLE) IMPLANT
GAUZE SPONGE 4X4 12PLY STRL (GAUZE/BANDAGES/DRESSINGS) ×2 IMPLANT
GLOVE ECLIPSE 9.0 STRL (GLOVE) ×2 IMPLANT
GLOVE EXAM NITRILE XL STR (GLOVE) IMPLANT
GOWN STRL REUS W/ TWL LRG LVL3 (GOWN DISPOSABLE) IMPLANT
GOWN STRL REUS W/ TWL XL LVL3 (GOWN DISPOSABLE) IMPLANT
GOWN STRL REUS W/TWL 2XL LVL3 (GOWN DISPOSABLE) IMPLANT
GOWN STRL REUS W/TWL LRG LVL3 (GOWN DISPOSABLE)
GOWN STRL REUS W/TWL XL LVL3 (GOWN DISPOSABLE)
HALTER HD/CHIN CERV TRACTION D (MISCELLANEOUS) ×2 IMPLANT
HEMOSTAT POWDER KIT SURGIFOAM (HEMOSTASIS) ×2 IMPLANT
KIT BASIN OR (CUSTOM PROCEDURE TRAY) ×2 IMPLANT
KIT TURNOVER KIT B (KITS) ×2 IMPLANT
NEEDLE SPNL 20GX3.5 QUINCKE YW (NEEDLE) ×2 IMPLANT
NS IRRIG 1000ML POUR BTL (IV SOLUTION) ×2 IMPLANT
OIL CARTRIDGE MAESTRO DRILL (MISCELLANEOUS) ×2
PACK LAMINECTOMY NEURO (CUSTOM PROCEDURE TRAY) ×2 IMPLANT
PAD ARMBOARD 7.5X6 YLW CONV (MISCELLANEOUS) ×6 IMPLANT
PLATE 3 62.5XLCK NS SPNE CVD (Plate) ×1 IMPLANT
PLATE 3 ATLANTIS TRANS (Plate) ×1 IMPLANT
RUBBERBAND STERILE (MISCELLANEOUS) ×4 IMPLANT
SCREW ST FIX 4 ATL 3120213 (Screw) ×16 IMPLANT
SPACER SPNL 11X14X10XPEEK (Cage) ×1 IMPLANT
SPACER SPNL 11X14X6XPEEK CVD (Cage) ×1 IMPLANT
SPACER SPNL 11X14X7XPEEK CVD (Cage) ×1 IMPLANT
SPCR SPNL 11X14X10XPEEK (Cage) ×1 IMPLANT
SPCR SPNL 11X14X6XPEEK CVD (Cage) ×1 IMPLANT
SPCR SPNL 11X14X7XPEEK CVD (Cage) ×1 IMPLANT
SPONGE INTESTINAL PEANUT (DISPOSABLE) ×2 IMPLANT
SPONGE SURGIFOAM ABS GEL 100 (HEMOSTASIS) ×2 IMPLANT
STRIP CLOSURE SKIN 1/2X4 (GAUZE/BANDAGES/DRESSINGS) ×2 IMPLANT
SUT VIC AB 3-0 SH 8-18 (SUTURE) ×2 IMPLANT
SUT VIC AB 4-0 RB1 18 (SUTURE) ×2 IMPLANT
TAPE CLOTH 4X10 WHT NS (GAUZE/BANDAGES/DRESSINGS) ×2 IMPLANT
TAPE CLOTH SURG 4X10 WHT LF (GAUZE/BANDAGES/DRESSINGS) ×2 IMPLANT
TOWEL GREEN STERILE (TOWEL DISPOSABLE) ×2 IMPLANT
TOWEL GREEN STERILE FF (TOWEL DISPOSABLE) ×2 IMPLANT
TRAP SPECIMEN MUCOUS 40CC (MISCELLANEOUS) ×2 IMPLANT
WATER STERILE IRR 1000ML POUR (IV SOLUTION) ×2 IMPLANT

## 2018-12-26 NOTE — Anesthesia Postprocedure Evaluation (Signed)
Anesthesia Post Note  Patient: Dennis Mitchell  Procedure(s) Performed: Anterior Cervical Decompression Fusion - Cervical Four - Cervical Five - Cervical Five - Cervical Six - Cervical Six - Cervical Seven (N/A Spine Cervical)     Patient location during evaluation: PACU Anesthesia Type: General Level of consciousness: awake and alert, patient cooperative and oriented Pain management: pain level controlled Vital Signs Assessment: post-procedure vital signs reviewed and stable Respiratory status: spontaneous breathing, nonlabored ventilation, respiratory function stable and patient connected to nasal cannula oxygen Cardiovascular status: blood pressure returned to baseline and stable Postop Assessment: no apparent nausea or vomiting Anesthetic complications: no    Last Vitals:  Vitals:   12/26/18 1545 12/26/18 1622  BP:  (!) 160/96  Pulse:  90  Resp:  18  Temp: (!) 36.1 C 36.8 C  SpO2:  96%    Last Pain:  Vitals:   12/26/18 1622  TempSrc: Oral  PainSc:                  Zadrian Mccauley,E. Delonta Yohannes

## 2018-12-26 NOTE — Op Note (Signed)
Date of procedure: 12/26/2018  Date of dictation: Same  Service: Neurosurgery  Preoperative diagnosis: C4-5, C5-6, C6-7 stenosis with myelopathy  Postoperative diagnosis: Same  Procedure Name: C4-5, C5-6, C6-7 anterior cervical discectomy with interbody fusion utilizing interbody peek cages, locally harvested autograft, and anterior plate instrumentation  Surgeon:Vu Liebman A.Tremayne Sheldon, M.D.  Asst. Surgeon: Reinaldo Meeker, NP  Anesthesia: General  Indication: 68 year old male with neck and bilateral upper extremity pain numbness and weakness left worse than right.  Work-up demonstrates evidence of severe cervical spondylosis with severe spinal stenosis and cord compression at C4-5 C5-6 and to lesser degree 667.  Patient with compounding subacute disc herniation at C4-5 causing further spinal cord compression.  Patient also with severe neural foraminal stenosis left greater than right at C4-5, C5-6 and C6-7.  Patient presents now for 3 level anterior cervical decompression and fusion in hopes of improving his symptoms.  Operative note: After induction of anesthesia, patient positioned supine with neck slightly extended and held in place with halter traction.  Patient's anterior cervical region prepped draped sterilely.  Incision made overlying C5-6.  Dissection performed on the right.  Retractor placed.  Fluoroscopy used.  Levels confirmed.  Disc spaces at C4-5, C5-6 and C6-7 were then incised.  Discectomy was then performed using various instruments down to level the posterior annulus.  Microscope was then brought to field used throughout the remainder of the discectomies.  Remaining aspects of annulus and osteophytes were removed at the C4-5 level first using high-speed drill.  Posterior logical limb was elevated and resected.  Underlying thecal sac was then identified.  A wide central decompression then performed undercutting the bodies of C4 and C5.  Decompression then proceeded into each neural foramen.  Wide  anterior foraminotomies performed along the course the exiting C5 nerve roots bilaterally.  A significant disc herniation was resected at this level.  Wound was then irrigated with saline solution.  Gelfoam was placed topically for hemostasis.  Procedures then repeated at C5-6 and C6-7 with the findings being severe stenosis secondary to posterior osteophytic disease at C5-6 and severe foraminal stenosis and central stenosis at C6-7.  Gelfoam was removed all 3 levels.  [employer] anatomic peek cages packed with locally harvested autograft were then impacted into place and recessed slightly from the anterior cortical margin at C4-5, C5-6 and C6-7.  Atlantis translational plate was then placed over the C3-4, C5, C6 and C7 levels.  This then attached under fluoroscopic guidance using 13 mm fixed angle screws to each at all 4 levels.  All screws given a final tightening found to be solidly in the bone.  Locking screws engaged in all levels.  Final images reveal good position of the cages and the hardware at the proper upper level with normal alignment of spine.  Wound is then irrigated one final time.  Hemostasis was assured with the bipolar trocar.  Wound is then closed in layers with Vicryl sutures.  Steri-Strips and sterile dressing were applied.  No apparent complications.  Patient tolerated the procedure well and he returned to the recovery room postop.

## 2018-12-26 NOTE — Anesthesia Preprocedure Evaluation (Addendum)
Anesthesia Evaluation  Patient identified by MRN, date of birth, ID band Patient awake    Reviewed: Allergy & Precautions, NPO status , Patient's Chart, lab work & pertinent test results  History of Anesthesia Complications Negative for: history of anesthetic complications  Airway Mallampati: II  TM Distance: >3 FB Neck ROM: Full    Dental  (+) Dental Advisory Given, Chipped   Pulmonary sleep apnea and Continuous Positive Airway Pressure Ventilation , COPD,  COPD inhaler, Current SmokerPatient did not abstain from smoking.,  12/22/2018 SARS coronavirus NEG   breath sounds clear to auscultation       Cardiovascular hypertension, Pt. on medications (-) angina Rhythm:Regular Rate:Normal     Neuro/Psych Anxiety Depression Chronic back pain    GI/Hepatic Neg liver ROS, GERD  Controlled,S/p bariatric surgery   Endo/Other  diabetes (off meds now)Morbid obesity  Renal/GU negative Renal ROS     Musculoskeletal   Abdominal (+) + obese,   Peds  Hematology negative hematology ROS (+)   Anesthesia Other Findings   Reproductive/Obstetrics                            Anesthesia Physical Anesthesia Plan  ASA: III  Anesthesia Plan: General   Post-op Pain Management:    Induction: Intravenous  PONV Risk Score and Plan: 2 and Ondansetron and Dexamethasone  Airway Management Planned: Oral ETT  Additional Equipment: None  Intra-op Plan:   Post-operative Plan: Extubation in OR  Informed Consent: I have reviewed the patients History and Physical, chart, labs and discussed the procedure including the risks, benefits and alternatives for the proposed anesthesia with the patient or authorized representative who has indicated his/her understanding and acceptance.     Dental advisory given  Plan Discussed with: CRNA and Surgeon  Anesthesia Plan Comments:        Anesthesia Quick  Evaluation

## 2018-12-26 NOTE — Progress Notes (Addendum)
Pharmacy Antibiotic Note  Dennis Mitchell is a 68 y.o. male admitted on 12/26/2018 with cervical stenosis with myelopathy; pt is S/P cervical discectomy with fusion this afternoon.  Pharmacy has been consulted for vancomycin dosing for surgical prophylaxis.  Pt rec'd vancomycin 1500 mg IV pre-op today at 10:50 AM. Per RN, pt has no drains in place post op. Pt has hx of PCN allergy.  WBC 7.3, afebrile; Scr 1.03, CrCl 79.1 ml/min; renal function stable from last value in Epic.   Plan: Vancomycin 1500 mg IV X 1 at 22:30 PM this evening (~12 hrs after pre-op dose)  Height: 5\' 7"  (170.2 cm) IBW/kg (Calculated) : 66.1  Temp (24hrs), Avg:97.7 F (36.5 C), Min:97 F (36.1 C), Max:98.3 F (36.8 C)  Recent Labs  Lab 12/22/18 1049  WBC 7.3  CREATININE 1.03    Estimated Creatinine Clearance: 79.1 mL/min (by C-G formula based on SCr of 1.03 mg/dL).    Allergies  Allergen Reactions  . Bee Venom Shortness Of Breath  . Beef-Derived Products Anaphylaxis    Pt. Reports its no longer a problem  . Pork-Derived Products Anaphylaxis    Tolerated heparin in the past per MD 3/7 tolerated Lovenox pre-op  Pt. Reports this is no longer a problem  . Aspirin Other (See Comments)    Lip swelling  . Lisinopril Other (See Comments)    unknown  . Penicillins Hives    .Marland KitchenHas patient had a PCN reaction causing immediate rash, facial/tongue/throat swelling, SOB or lightheadedness with hypotension: Yes -Lips Has patient had a PCN reaction causing severe rash involving mucus membranes or skin necrosis: No Has patient had a PCN reaction that required hospitalization No Has patient had a PCN reaction occurring within the last 10 years: No If all of the above answers are "NO", then may proceed with Cephalosporin use.      Microbiology results: 12/10 MRSA PCR: negative 12/10 COVID: negative  Thank you for allowing pharmacy to be a part of this patient's care.  Gillermina Hu, PharmD, BCPS,  [facility] Clinical Pharmacist 12/26/2018 5:14 PM

## 2018-12-26 NOTE — Brief Op Note (Signed)
12/26/2018  2:10 PM  PATIENT:  Dennis Mitchell  68 y.o. male  PRE-OPERATIVE DIAGNOSIS:  Stenosis  POST-OPERATIVE DIAGNOSIS:  Stenosis  PROCEDURE:  Procedure(s) with comments: Anterior Cervical Decompression Fusion - Cervical Four - Cervical Five - Cervical Five - Cervical Six - Cervical Six - Cervical Seven (N/A) - Anterior Cervical Decompression Fusion - Cervical Four - Cervical Five - Cervical Five - Cervical Six - Cervical Six - Cervical Seven  SURGEON:  Surgeon(s) and Role:    Earnie Larsson, MD - Primary  PHYSICIAN ASSISTANT:   ASSISTANTSMearl Latin   ANESTHESIA:   general  EBL:  125 mL   BLOOD ADMINISTERED:none  DRAINS: none   LOCAL MEDICATIONS USED:  NONE  SPECIMEN:  No Specimen  DISPOSITION OF SPECIMEN:  N/A  COUNTS:  YES  TOURNIQUET:  * No tourniquets in log *  DICTATION: .Dragon Dictation  PLAN OF CARE: Admit to inpatient   PATIENT DISPOSITION:  PACU - hemodynamically stable.   Delay start of Pharmacological VTE agent (>24hrs) due to surgical blood loss or risk of bleeding: yes

## 2018-12-26 NOTE — Progress Notes (Signed)
Orthopedic Tech Progress Note Patient Details:  Dennis Mitchell [birth year] [id number] Applied SOFT COLLAR while patient was in PACU Ortho Devices Type of Ortho Device: Soft collar Ortho Device/Splint Location: NECK Ortho Device/Splint Interventions: Application, Ordered   Post Interventions Patient Tolerated: Well Instructions Provided: Care of device, Adjustment of device   Janit Pagan 12/26/2018, 2:47 PM

## 2018-12-26 NOTE — Transfer of Care (Signed)
Immediate Anesthesia Transfer of Care Note  Patient: Dennis Mitchell  Procedure(s) Performed: Anterior Cervical Decompression Fusion - Cervical Four - Cervical Five - Cervical Five - Cervical Six - Cervical Six - Cervical Seven (N/A Spine Cervical)  Patient Location: PACU  Anesthesia Type:General  Level of Consciousness: awake, alert , oriented, patient cooperative and responds to stimulation  Airway & Oxygen Therapy: Patient Spontanous Breathing and Patient connected to face mask oxygen  Post-op Assessment: Report given to RN, Post -op Vital signs reviewed and stable and Patient moving all extremities X 4  Post vital signs: Reviewed and stable  Last Vitals:  Vitals Value Taken Time  BP 157/53 12/26/18 1416  Temp    Pulse 89 12/26/18 1420  Resp 17 12/26/18 1420  SpO2 100 % 12/26/18 1420  Vitals shown include unvalidated device data.  Last Pain:  Vitals:   12/26/18 1008  TempSrc:   PainSc: 5          Complications: No apparent anesthesia complications

## 2018-12-26 NOTE — H&P (Signed)
Dennis Mitchell is an 68 y.o. male.   Chief Complaint: Neck pain HPI: 68 year old male with worsening neck and bilateral upper extremity pain paresthesias numbness.  Patient with increasing loss of strength sensation into his left upper extremity.  Patient with decreased fine motor movement and control in both hands.  Patient with some gait instability and spasticity with ambulation.  Work-up demonstrates evidence of severe multilevel disc degeneration with a moderately large central disc herniation at C4-5 with severe spinal stenosis.  Patient also has marked disc degeneration with associated spondylosis and stenosis at C5-6 and has severe foraminal stenosis on the left at C6-7.  Patient has failed medical management presents now for surgical decompression and fusion in hopes of improving her symptoms.  Past Medical History:  Diagnosis Date  . Anxiety   . Arthritis   . Back pain   . Depression   . Diabetes mellitus    due to weight loss, lost 70 lbs. due to gastric bypass   . Dysphagia   . Fatigue   . History of hiatal hernia   . Hypercholesteremia   . Hypertension   . Neuropathic pain   . Skin cancer of face   . Sleep apnea    Cpap  . Trauma 2010   multiple traumas- neck, back, ribs, collar bones, - resulted in prolonged hosp. & rehab stay , with trach    Past Surgical History:  Procedure Laterality Date  . BREATH TEK H PYLORI N/A 03/01/2014   Procedure: BREATH TEK H PYLORI;  Surgeon: Alphonsa Overall, MD;  Location: [facility];  Service: General;  Laterality: N/A;  . COLONOSCOPY N/A 09/20/2013   Procedure: COLONOSCOPY;  Surgeon: Rogene Houston, MD;  Location: [facility];  Service: Endoscopy;  Laterality: N/A;  930  . DOG BITE    . LAPAROSCOPIC GASTRIC SLEEVE RESECTION WITH HIATAL HERNIA REPAIR N/A 03/19/2015   Procedure: LAPAROSCOPIC GASTRIC SLEEVE RESECTION WITH  HIATAL HERNIA REPAIR;  Surgeon: Alphonsa Overall, MD;  Location: [facility];  Service: General;  Laterality: N/A;  .  TRACHEOSTOMY  2010  . UPPER GI ENDOSCOPY  03/19/2015   Procedure: UPPER GI ENDOSCOPY;  Surgeon: Alphonsa Overall, MD;  Location: [facility];  Service: General;;    History reviewed. No pertinent family history. Social History:  reports that he has been smoking cigarettes. He has a 20.00 pack-year smoking history. He has never used smokeless tobacco. He reports current alcohol use of about 10.0 standard drinks of alcohol per week. He reports that he does not use drugs.  Allergies:  Allergies  Allergen Reactions  . Bee Venom Shortness Of Breath  . Beef-Derived Products Anaphylaxis    Pt. Reports its no longer a problem  . Pork-Derived Products Anaphylaxis    Tolerated heparin in the past per MD 3/7 tolerated Lovenox pre-op  Pt. Reports this is no longer a problem  . Aspirin Other (See Comments)    Lip swelling  . Lisinopril Other (See Comments)    unknown  . Penicillins Hives    .Marland KitchenHas patient had a PCN reaction causing immediate rash, facial/tongue/throat swelling, SOB or lightheadedness with hypotension: Yes -Lips Has patient had a PCN reaction causing severe rash involving mucus membranes or skin necrosis: No Has patient had a PCN reaction that required hospitalization No Has patient had a PCN reaction occurring within the last 10 years: No If all of the above answers are "NO", then may proceed with Cephalosporin use.      Medications Prior to Admission  Medication Sig Dispense Refill  . amLODipine (NORVASC) 5 MG tablet Take one tablet po each morning (Patient taking differently: Take 5 mg by mouth daily. ) 90 tablet 1  . citalopram (CELEXA) 40 MG tablet Take 1 tablet (40 mg total) by mouth daily. 90 tablet 1  . cyclobenzaprine (FLEXERIL) 10 MG tablet TAKE ONE-HALF TO ONE TABLET BY MOUTH THREE TIMES DAILY AS NEEDED FOR  SPASMS (Patient taking differently: Take 10 mg by mouth 3 (three) times daily as needed for muscle spasms (Back). ) 30 tablet 3  . hydrochlorothiazide (HYDRODIURIL) 25 MG  tablet Take 1 tablet (25 mg total) by mouth daily. 90 tablet 1  . hydrOXYzine (VISTARIL) 25 MG capsule TAKE ONE CAPSULE BY MOUTH EVERY 4 TO 6 HOURS AS NEEDED FOR  ITCHING (Patient taking differently: Take 25 mg by mouth every 6 (six) hours as needed for itching. TAKE ONE CAPSULE BY MOUTH EVERY 4 TO 6 HOURS AS NEEDED FOR  ITCHING) 36 capsule 5  . LORazepam (ATIVAN) 2 MG tablet TAKE 1 TABLET BY MOUTH AT BEDTIME AS NEEDED FOR SLEEP (Patient taking differently: Take 2 mg by mouth at bedtime as needed for sleep. TAKE 1 TABLET BY MOUTH AT BEDTIME AS NEEDED FOR SLEEP) 30 tablet 5  . meclizine (ANTIVERT) 25 MG tablet TAKE 1 TABLET BY MOUTH THREE TIMES DAILY AS NEEDED FOR DIZZINESS (Patient taking differently: Take 25 mg by mouth daily. ) 90 tablet 0  . oxyCODONE-acetaminophen (PERCOCET/ROXICET) 5-325 MG tablet Take one tablet up to bid prn pain (Patient taking differently: Take 1 tablet by mouth daily. Take an additional tablet if needed) 45 tablet 0  . Potassium 99 MG TABS Take 99 mg by mouth daily.    . sildenafil (REVATIO) 20 MG tablet TAKE 5 TABLETS BY MOUTH EVERY DAY AS NEEDED 2 HOURS BEFORE SEX (Patient taking differently: Take 100 mg by mouth as needed (ED). AS NEEDED 2 HOURS BEFORE SEX) 600 tablet 0  . diclofenac sodium (VOLTAREN) 1 % GEL APPLY TOPICALLY TWICE DAILY TO AFFECTED AREA AS NEEDED FOR PAIN (Patient taking differently: Apply 2 g topically 2 (two) times daily as needed (pain knee). ) 100 g 11  . EPINEPHrine (EPI-PEN) 0.3 mg/0.3 mL DEVI Inject 0.3 mLs (0.3 mg total) into the muscle once. 1 Device 0  . fluorouracil (EFUDEX) 5 % cream Apply 1 application topically daily as needed. Reported on 05/09/2015 (Patient not taking: Reported on 12/19/2018) 40 g 6  . montelukast (SINGULAIR) 10 MG tablet Take 1 tablet (10 mg total) by mouth at bedtime. (Patient not taking: Reported on 12/19/2018) 30 tablet 5  . oxyCODONE-acetaminophen (PERCOCET/ROXICET) 5-325 MG tablet Take one up to bid prn pain (Patient not  taking: Reported on 12/19/2018) 45 tablet 0  . oxyCODONE-acetaminophen (PERCOCET/ROXICET) 5-325 MG tablet TAKE ONE UP TO BID PRN PAIN (Patient not taking: Reported on 12/19/2018) 45 tablet 0  . potassium chloride SA (KLOR-CON) 20 MEQ tablet TAKE 1 TABLET EVERY MORNING (Patient not taking: Reported on 12/19/2018) 90 tablet 1  . predniSONE (DELTASONE) 10 MG tablet 4 tabs a day for 3 days, then 3 tabs a day for 3 days, then 2 tabs a day for 2 days (Patient not taking: Reported on 12/19/2018) 25 tablet 0    Results for orders placed or performed during the hospital encounter of 12/26/18 (from the past 48 hour(s))  Glucose, capillary     Status: Abnormal   Collection Time: 12/26/18 10:13 AM  Result Value Ref Range   Glucose-Capillary 121 (H)  70 - 99 mg/dL   No results found.  Pertinent items noted in HPI and remainder of comprehensive ROS otherwise negative.  Blood pressure (!) 198/96, pulse 87, temperature 98 F (36.7 C), temperature source Oral, resp. rate 18, height 5\' 7"  (1.702 m), SpO2 100 %.  Patient is awake and alert.  He is oriented and appropriate.  Speech is fluent.  Judgment insight are intact.  Cranial nerve function normal bilateral.  Motor examination of the extremities reveals weakness of his left biceps muscle grading at 4/5.  He has marked weakness of his left brachioradialis and wrist extensor muscles grading out of 4-/5.  He has moderate grip strength weakness bilaterally and significant intrinsic muscle strength bilaterally in both hands.  Lower extremity strength is normal.  Tone is somewhat increased.  Reflexes are hypoactive.  Patient with a Hoffmann's responses in both hands.  Examination head ears eyes nose throat summer.  Chest and abdomen are benign.  Extremities are free from injury deformity. Assessment/Plan Cervical stenosis with myelopathy.  Plan C4-5, C5-6, C6-7 anterior cervical decompression and fusion utilizing interbody cage, locally harvested autograft, and anterior  plate instrumentation.  Risks and benefits of been explained.  Patient wishes to proceed.  Dennis Mitchell 12/26/2018, 11:28 AM

## 2018-12-26 NOTE — Anesthesia Procedure Notes (Signed)
Procedure Name: Intubation Date/Time: 12/26/2018 11:52 AM Performed by: Verdie Drown, CRNA Pre-anesthesia Checklist: Patient identified, Emergency Drugs available, Suction available, Patient being monitored and Timeout performed Patient Re-evaluated:Patient Re-evaluated prior to induction Oxygen Delivery Method: Circle system utilized Preoxygenation: Pre-oxygenation with 100% oxygen Induction Type: IV induction Ventilation: Mask ventilation without difficulty and Oral airway inserted - appropriate to patient size Laryngoscope Size: Glidescope and 3 Grade View: Grade I Tube type: Oral Tube size: 7.5 mm Number of attempts: 1 Airway Equipment and Method: Stylet and Video-laryngoscopy Placement Confirmation: ETT inserted through vocal cords under direct vision,  breath sounds checked- equal and bilateral and CO2 detector Secured at: 22 cm Tube secured with: Tape Dental Injury: Teeth and Oropharynx as per pre-operative assessment  Difficulty Due To: Difficulty was anticipated, Difficult Airway- due to large tongue and Difficult Airway- due to reduced neck mobility

## 2018-12-27 LAB — GLUCOSE, CAPILLARY
Glucose-Capillary: 142 mg/dL — ABNORMAL HIGH (ref 70–99)
Glucose-Capillary: 108 mg/dL — ABNORMAL HIGH (ref 70–99)

## 2018-12-27 MED ORDER — HYDROCODONE-ACETAMINOPHEN 10-325 MG PO TABS: 1.0000 | ORAL_TABLET | ORAL | 0 refills | Status: DC | PRN

## 2018-12-27 MED ORDER — CYCLOBENZAPRINE HCL 10 MG PO TABS: 10.0000 mg | ORAL_TABLET | Freq: Three times a day (TID) | ORAL | 0 refills | Status: DC | PRN

## 2018-12-27 NOTE — Evaluation (Signed)
Occupational Therapy Evaluation Patient Details Name: Dennis Mitchell MRN: [id number] DOB: February 18, 1950 Today's Date: 12/27/2018    History of Present Illness Pt is a 68 y/o male now s/p ACDF C4-7.   Clinical Impression   This 68 y/o male presents with the above. PTA pt reports independence with ADL, iADL and functional mobility. Pt demonstrating room level mobility without AD overall at supervision level. He currently requires minguard-minA for LB ADL, is mod independent with UB ADL. Educated pt re: cervical precautions, safety and compensatory techniques for completing ADL and functional transfers while maintaining precautions with pt verbalizing understanding. Pt reports plans to return home with intermittent assist from his fiance' with ADL/iADL PRN. Questions answered throughout with no further acute OT needs identified. Feel pt is safe to return home once medically ready given available assist from sig other. Acute OT to sign off, thank you for this referral.     Follow Up Recommendations  Home health OT;Supervision - Intermittent    Equipment Recommendations  None recommended by OT           Precautions / Restrictions Precautions Precautions: Fall;Cervical Precaution Booklet Issued: Yes (comment) Precaution Comments: issued and reviewed with pt Required Braces or Orthoses: Cervical Brace Cervical Brace: Soft collar Restrictions Weight Bearing Restrictions: No      Mobility Bed Mobility               General bed mobility comments: pt received OOB in recliner, verbally reviewed log roll technique however pt reports he typically sleeps in recliner   Transfers Overall transfer level: Modified independent Equipment used: None                  Balance Overall balance assessment: Mild deficits observed, not formally tested                                         ADL either performed or assessed with clinical judgement   ADL Overall ADL's :  Needs assistance/impaired Eating/Feeding: Modified independent;Sitting   Grooming: Min guard;Supervision/safety;Standing   Upper Body Bathing: Set up;Sitting   Lower Body Bathing: Min guard;Sit to/from stand   Upper Body Dressing : Set up;Sitting   Lower Body Dressing: Min guard;Minimal assistance;Sit to/from stand Lower Body Dressing Details (indicate cue type and reason): educated to perform from sit<>stand level (vs standing alone) for increased safety  Toilet Transfer: Supervision/safety;Ambulation Toilet Transfer Details (indicate cue type and reason): simulated via transfer to/from recliner Toileting- Clothing Manipulation and Hygiene: Supervision/safety;Sit to/from Nurse, children's Details (indicate cue type and reason): discussed option of shower seat this session, pt declines needing one; discussed additional safety strategies during task completion  Functional mobility during ADLs: Supervision/safety General ADL Comments: edcuated in cervical precautions, safety and compensatory techniques for completing ADL and functional transfers; additional focus on safety/compensatory techniques for completing iADL tasks including having sig other assist with pet care, pt verbalizing understanding      Vision         Perception     Praxis      Pertinent Vitals/Pain Pain Assessment: Faces Faces Pain Scale: Hurts even more Pain Location: generalized discomfort in throat given recent sx, incisional Pain Descriptors / Indicators: Grimacing;Discomfort;Sore Pain Intervention(s): Limited activity within patient's tolerance;Monitored during session;Repositioned     Hand Dominance Right   Extremity/Trunk Assessment Upper Extremity Assessment Upper Extremity Assessment: Overall WFL for  tasks assessed   Lower Extremity Assessment Lower Extremity Assessment: Overall WFL for tasks assessed   Cervical / Trunk Assessment Cervical / Trunk Assessment: Other  exceptions Cervical / Trunk Exceptions: s/p cervical sx   Communication Communication Communication: HOH   Cognition Arousal/Alertness: Awake/alert Behavior During Therapy: WFL for tasks assessed/performed Overall Cognitive Status: Within Functional Limits for tasks assessed                                 General Comments: sleepy at the start but more awake/alert as session progressed    General Comments       Exercises     Shoulder Instructions      Home Living Family/patient expects to be discharged to:: Private residence Living Arrangements: Alone Available Help at Discharge: Other (Comment);Available PRN/intermittently(fiance) Type of Home: House Home Access: Stairs to enter Entrance Stairs-Number of Steps: 1   Home Layout: Two level;Able to live on main level with bedroom/bathroom(reports doesnt need to go upstairs)     Bathroom Shower/Tub: Teacher, early years/pre: Standard     Home Equipment: Environmental consultant - 2 wheels          Prior Functioning/Environment Level of Independence: Independent        Comments: driving        OT Problem List: Decreased range of motion;Impaired balance (sitting and/or standing);Decreased knowledge of use of DME or AE;Decreased knowledge of precautions;Pain      OT Treatment/Interventions:      OT Goals(Current goals can be found in the care plan section) Acute Rehab OT Goals Patient Stated Goal: home today OT Goal Formulation: All assessment and education complete, DC therapy  OT Frequency:     Barriers to D/C:            Co-evaluation              AM-PAC OT "6 Clicks" Daily Activity     Outcome Measure Help from another person eating meals?: None Help from another person taking care of personal grooming?: None Help from another person toileting, which includes using toliet, bedpan, or urinal?: None Help from another person bathing (including washing, rinsing, drying)?: None Help from  another person to put on and taking off regular upper body clothing?: None Help from another person to put on and taking off regular lower body clothing?: A Little 6 Click Score: 23   End of Session Equipment Utilized During Treatment: Cervical collar Nurse Communication: Mobility status  Activity Tolerance: Patient tolerated treatment well Patient left: in chair;with call bell/phone within reach  OT Visit Diagnosis: Other abnormalities of gait and mobility (R26.89);Pain Pain - part of body: (cervical region)                Time: [id number] OT Time Calculation (min): 22 min Charges:  OT General Charges $OT Visit: 1 Visit OT Evaluation $OT Eval Low Complexity: [place], OT Supplemental Rehabilitation Services Pager [phone] Office [phone]   Dennis Mitchell 12/27/2018, 1:10 PM

## 2018-12-27 NOTE — Discharge Summary (Signed)
Physician Discharge Summary  Patient ID: Dennis Mitchell MRN: [id number] DOB/AGE: [birth year] 68 y.o.  Admit date: 12/26/2018 Discharge date: 12/27/2018  Admission Diagnoses: Cervical spondylosis with myelopathy and radiculopathy  Discharge Diagnoses:  Active Problems:   Cervical spondylosis with myelopathy and radiculopathy   Discharged Condition: good  Hospital Course: Patient was admitted to [facility] following uncomplicated [id number] ACDF. The patient tolerated the procedure well. He worked with occupational therapy. His pain is well controlled. He is ready for discharge home.  Consults: rehabilitation medicine  Significant Diagnostic Studies: radiology: DG Cervical Spine 2-3 Views  Result Date: 12/26/2018 CLINICAL DATA:  Followup cervical fusion EXAM: CERVICAL SPINE - 2-3 VIEW; DG C-ARM 1-60 MIN COMPARISON:  12/07/2018 FINDINGS: C-arm images show ACDF from C4 through C7 with anterior plate and screw fixation. Components appear well positioned as demonstrated. IMPRESSION: ACDF C4 through C7. Electronically Signed   By: Nelson Chimes M.D.   On: 12/26/2018 14:15   DG C-Arm 1-60 Min  Result Date: 12/26/2018 CLINICAL DATA:  Followup cervical fusion EXAM: CERVICAL SPINE - 2-3 VIEW; DG C-ARM 1-60 MIN COMPARISON:  12/07/2018 FINDINGS: C-arm images show ACDF from C4 through C7 with anterior plate and screw fixation. Components appear well positioned as demonstrated. IMPRESSION: ACDF C4 through C7. Electronically Signed   By: Nelson Chimes M.D.   On: 12/26/2018 14:15     Treatments: surgery: C4-5, C5-6, C6-7 anterior cervical discectomy with interbody fusion utilizing interbody peek cages ([employer]), locally harvested autograft, and anterior plate instrumentation ([employer]antis translational plate)  Discharge Exam: Blood pressure (!) 141/72, pulse 74, temperature 97.7 F (36.5 C), resp. rate 18, height 5\' 7"  (1.702 m), SpO2 93 %.   Alert and oriented x 4 PERRLA CN II-XII grossly intact MAE, Strength  and sensation intact Incision is covered with Honeycomb dressing and Steri Strips; Dressing is clean, dry, and intact   Disposition: Discharge disposition: 01-Home or Self Care        Allergies as of 12/27/2018      Reactions   Bee Venom Shortness Of Breath   Aspirin Other (See Comments)   Lip swelling   Lisinopril Other (See Comments)   unknown   Penicillins Hives   .Marland KitchenHas patient had a PCN reaction causing immediate rash, facial/tongue/throat swelling, SOB or lightheadedness with hypotension: Yes -Lips Has patient had a PCN reaction causing severe rash involving mucus membranes or skin necrosis: No Has patient had a PCN reaction that required hospitalization No Has patient had a PCN reaction occurring within the last 10 years: No If all of the above answers are "NO", then may proceed with Cephalosporin use.       Medication List    STOP taking these medications   fluorouracil 5 % cream Commonly known as: EFUDEX   montelukast 10 MG tablet Commonly known as: SINGULAIR   oxyCODONE-acetaminophen 5-325 MG tablet Commonly known as: PERCOCET/ROXICET   potassium chloride SA 20 MEQ tablet Commonly known as: KLOR-CON   predniSONE 10 MG tablet Commonly known as: DELTASONE     TAKE these medications   amLODipine 5 MG tablet Commonly known as: NORVASC Take one tablet po each morning What changed:   how much to take  how to take this  when to take this  additional instructions   citalopram 40 MG tablet Commonly known as: CELEXA Take 1 tablet (40 mg total) by mouth daily.   cyclobenzaprine 10 MG tablet Commonly known as: FLEXERIL Take 1 tablet (10 mg total) by mouth 3 (three) times daily as  needed for muscle spasms. What changed:   how much to take  how to take this  when to take this  reasons to take this  additional instructions   diclofenac sodium 1 % Gel Commonly known as: Voltaren APPLY TOPICALLY TWICE DAILY TO AFFECTED AREA AS NEEDED FOR  PAIN What changed:   how much to take  how to take this  when to take this  reasons to take this  additional instructions   EPINEPHrine 0.3 mg/0.3 mL Devi Commonly known as: EPI-PEN Inject 0.3 mLs (0.3 mg total) into the muscle once.   hydrochlorothiazide 25 MG tablet Commonly known as: HYDRODIURIL Take 1 tablet (25 mg total) by mouth daily.   HYDROcodone-acetaminophen 10-325 MG tablet Commonly known as: NORCO Take 1-2 tablets by mouth every 4 (four) hours as needed for severe pain ((score 7 to 10)).   hydrOXYzine 25 MG capsule Commonly known as: VISTARIL TAKE ONE CAPSULE BY MOUTH EVERY 4 TO 6 HOURS AS NEEDED FOR  ITCHING What changed:   how much to take  how to take this  when to take this  reasons to take this   LORazepam 2 MG tablet Commonly known as: ATIVAN TAKE 1 TABLET BY MOUTH AT BEDTIME AS NEEDED FOR SLEEP What changed:   reasons to take this  additional instructions   meclizine 25 MG tablet Commonly known as: ANTIVERT TAKE 1 TABLET BY MOUTH THREE TIMES DAILY AS NEEDED FOR DIZZINESS What changed: See the new instructions.   Potassium 99 MG Tabs Take 99 mg by mouth daily.   sildenafil 20 MG tablet Commonly known as: REVATIO TAKE 5 TABLETS BY MOUTH EVERY DAY AS NEEDED 2 HOURS BEFORE SEX What changed: See the new instructions.      Follow-up Information    Earnie Larsson, MD. Go in 2 week(s).   Specialty: Neurosurgery Contact information: [address] [place] [address] [phone]           Signed: Patricia Nettle 12/27/2018, 9:52 AM

## 2018-12-27 NOTE — Plan of Care (Signed)
Patient alert and oriented, mae's well, voiding adequate amount of urine, swallowing without difficulty, no c/o pain at time of discharge. Patient discharged home with family. Script and discharged instructions given to patient. Patient and family stated understanding of instructions given. Patient has an appointment with Dr. Pool  

## 2018-12-27 NOTE — Discharge Instructions (Signed)

## 2018-12-31 LAB — SPECIMEN STATUS REPORT

## 2018-12-31 LAB — PSA: Prostate Specific Ag, Serum: 0.2 ng/mL (ref 0.0–4.0)

## 2019-01-01 ENCOUNTER — Encounter: Payer: Self-pay | Admitting: *Deleted

## 2019-01-05 ENCOUNTER — Other Ambulatory Visit: Payer: Self-pay | Admitting: Family Medicine

## 2019-01-19 ENCOUNTER — Telehealth: Payer: Self-pay | Admitting: Family Medicine

## 2019-01-19 ENCOUNTER — Other Ambulatory Visit: Payer: Self-pay | Admitting: Family Medicine

## 2019-01-19 MED ORDER — OXYCODONE-ACETAMINOPHEN 5-325 MG PO TABS: ORAL_TABLET | ORAL | 0 refills | Status: DC

## 2019-01-19 NOTE — Telephone Encounter (Signed)
The prescription was refilled please notify patient he needs to schedule a virtual follow-up visit for pain management thank you

## 2019-01-19 NOTE — Telephone Encounter (Signed)
Left message to return call. Oxycodone is not on med list. Med list has hydrocodone. Need to clarify what he is taking

## 2019-01-19 NOTE — Telephone Encounter (Signed)
Patient states he has been on oxycodone for years and the hydrocodone on the list was given after a surgery recently- last oxycodone written to be filled 11/2018- med pended as last written

## 2019-01-19 NOTE — Telephone Encounter (Signed)
Pt is needing new script on oxyCODONE-acetaminophen (PERCOCET/ROXICET) 5-325 MG per tablet. He went to pharmacy and his script has expired.   [facility], [place] - [address]

## 2019-01-19 NOTE — Telephone Encounter (Signed)
Patient notified and scheduled virtual visit for pain management with Dr Richardson Landry

## 2019-01-31 ENCOUNTER — Ambulatory Visit (INDEPENDENT_AMBULATORY_CARE_PROVIDER_SITE_OTHER): Payer: Medicare Other | Admitting: Family Medicine

## 2019-01-31 ENCOUNTER — Other Ambulatory Visit: Payer: Self-pay

## 2019-01-31 DIAGNOSIS — Z79899 Other long term (current) drug therapy: Secondary | ICD-10-CM

## 2019-01-31 DIAGNOSIS — G8929 Other chronic pain: Secondary | ICD-10-CM

## 2019-01-31 DIAGNOSIS — M5440 Lumbago with sciatica, unspecified side: Secondary | ICD-10-CM | POA: Diagnosis not present

## 2019-01-31 MED ORDER — OXYCODONE-ACETAMINOPHEN 5-325 MG PO TABS: ORAL_TABLET | ORAL | 0 refills | Status: DC

## 2019-01-31 NOTE — Progress Notes (Signed)
   Subjective:  Audio  Patient ID: Dennis Mitchell, male    DOB: 09-Sep-1950, 69 y.o.   MRN: [id number]  HPI This patient was seen today for chronic pain  The medication list was reviewed and updated.   -Compliance with medication: hydrocodone 10-325  - Number patient states they take daily: 1 each morning; sometimes 2 depending on activity  -when was the last dose patient took? This morning  The patient was advised the importance of maintaining medication and not using illegal substances with these.  Here for refills and follow up  The patient was educated that we can provide 3 monthly scripts for their medication, it is their responsibility to follow the instructions.  Side effects or complications from medications: none  Patient is aware that pain medications are meant to minimize the severity of the pain to allow their pain levels to improve to allow for better function. They are aware of that pain medications cannot totally remove their pain.  Due for UDT ( at least once per year) :   Virtual Visit via Telephone Note  I connected with Dennis Mitchell on 01/31/19 at 10:00 AM EST by telephone and verified that I am speaking with the correct person using two identifiers.  Location: Patient: home Provider: office   I discussed the limitations, risks, security and privacy concerns of performing an evaluation and management service by telephone and the availability of in person appointments. I also discussed with the patient that there may be a patient responsible charge related to this service. The patient expressed understanding and agreed to proceed.   History of Present Illness:    Observations/Objective:   Assessment and Plan:   Follow Up Instructions:    I discussed the assessment and treatment plan with the patient. The patient was provided an opportunity to ask questions and all were answered. The patient agreed with the plan and demonstrated an understanding of  the instructions.   The patient was advised to call back or seek an in-person evaluation if the symptoms worsen or if the condition fails to improve as anticipated.  I provide80minutes of non-face-to-face time during this encounter.  Patient compliant with pain medication. Continues to experience the pain which led to initiation of analgesic intervention. No significant negative side effects. States definitely needs the pain medication to maintain current level of functioning. Does not receive controlled substance pain medication elsewhere.        Review of Systems No cough no shortness of breath no abdominal pain no fever    Objective:   Physical Exam   Virtual     Assessment & Plan:  Impression: Chronic pain. Patient compliant with medication. No substantial side effects. [place] controlled substance registry reviewed to ensure compliance and proper use of medication. Patient aware goal of medicine is not complete resolution of pain but to control his symptoms to improve his functional capacity. Aware of potential adverse side effects  Follow-up in 3 months for physical plus regular follow-up

## 2019-02-08 DIAGNOSIS — Z981 Arthrodesis status: Secondary | ICD-10-CM | POA: Diagnosis not present

## 2019-02-16 ENCOUNTER — Encounter: Payer: Self-pay | Admitting: Family Medicine

## 2019-03-16 DIAGNOSIS — R69 Illness, unspecified: Secondary | ICD-10-CM | POA: Diagnosis not present

## 2019-04-04 ENCOUNTER — Other Ambulatory Visit: Payer: Self-pay | Admitting: Family Medicine

## 2019-04-04 ENCOUNTER — Telehealth: Payer: Self-pay | Admitting: Family Medicine

## 2019-04-04 MED ORDER — HYDROXYZINE PAMOATE 25 MG PO CAPS: 25.0000 mg | ORAL_CAPSULE | Freq: Four times a day (QID) | ORAL | 0 refills | Status: DC | PRN

## 2019-04-04 MED ORDER — EPINEPHRINE 0.3 MG/0.3ML IJ SOAJ: 0.3000 mg | INTRAMUSCULAR | 0 refills | Status: DC | PRN

## 2019-04-04 NOTE — Telephone Encounter (Signed)
Prescription sent electronically to pharmacy. Telephone call- voicemail full.

## 2019-04-04 NOTE — Telephone Encounter (Signed)
Pt is wanting to know if epi pen is safe to use even though it is expired. He would like a new script sent to [facility] in [place] if that is needed.

## 2019-04-04 NOTE — Telephone Encounter (Signed)
Six mo worth 

## 2019-04-04 NOTE — Telephone Encounter (Signed)
Shold refill when expires, do generic form

## 2019-04-07 NOTE — Telephone Encounter (Signed)
Telephone call- voicemail full

## 2019-04-10 NOTE — Telephone Encounter (Signed)
Pt voicemail is full. Called [facility] and pt has picked up EpiPen

## 2019-04-21 ENCOUNTER — Other Ambulatory Visit: Payer: Self-pay | Admitting: Family Medicine

## 2019-04-28 ENCOUNTER — Other Ambulatory Visit: Payer: Self-pay

## 2019-04-28 ENCOUNTER — Ambulatory Visit (INDEPENDENT_AMBULATORY_CARE_PROVIDER_SITE_OTHER): Payer: Medicare HMO | Admitting: Family Medicine

## 2019-04-28 ENCOUNTER — Encounter: Payer: Self-pay | Admitting: Family Medicine

## 2019-04-28 VITALS — BP 138/80 | Temp 98.3°F | Wt 223.8 lb

## 2019-04-28 DIAGNOSIS — G542 Cervical root disorders, not elsewhere classified: Secondary | ICD-10-CM | POA: Diagnosis not present

## 2019-04-28 DIAGNOSIS — I10 Essential (primary) hypertension: Secondary | ICD-10-CM

## 2019-04-28 DIAGNOSIS — E1100 Type 2 diabetes mellitus with hyperosmolarity without nonketotic hyperglycemic-hyperosmolar coma (NKHHC): Secondary | ICD-10-CM

## 2019-04-28 DIAGNOSIS — F1190 Opioid use, unspecified, uncomplicated: Secondary | ICD-10-CM

## 2019-04-28 DIAGNOSIS — F5101 Primary insomnia: Secondary | ICD-10-CM

## 2019-04-28 DIAGNOSIS — Z79891 Long term (current) use of opiate analgesic: Secondary | ICD-10-CM | POA: Diagnosis not present

## 2019-04-28 LAB — POCT GLYCOSYLATED HEMOGLOBIN (HGB A1C): Hemoglobin A1C: 5.5 % (ref 4.0–5.6)

## 2019-04-28 MED ORDER — OXYCODONE-ACETAMINOPHEN 5-325 MG PO TABS: ORAL_TABLET | ORAL | 0 refills | Status: DC

## 2019-04-28 MED ORDER — HYDROCHLOROTHIAZIDE 25 MG PO TABS: 25.0000 mg | ORAL_TABLET | Freq: Every day | ORAL | 1 refills | Status: DC

## 2019-04-28 MED ORDER — AMLODIPINE BESYLATE 5 MG PO TABS: ORAL_TABLET | ORAL | 1 refills | Status: DC

## 2019-04-28 MED ORDER — CITALOPRAM HYDROBROMIDE 40 MG PO TABS: 40.0000 mg | ORAL_TABLET | Freq: Every day | ORAL | 1 refills | Status: DC

## 2019-04-28 NOTE — Progress Notes (Signed)
   Subjective:    Patient ID: Dennis Mitchell, male    DOB: 18-Sep-1950, 69 y.o.   MRN: [id number]  HPI This patient was seen today for chronic pain  The medication list was reviewed and updated.   -Compliance with medication: oxycodone 5-325  - Number patient states they take daily: 1-2; sometimes pt has to take 3 but pt states that is very rare  -when was the last dose patient took? This morning   The patient was advised the importance of maintaining medication and not using illegal substances with these.  Here for refills and follow up  The patient was educated that we can provide 3 monthly scripts for their medication, it is their responsibility to follow the instructions.  Side effects or complications from medications: none  Patient is aware that pain medications are meant to minimize the severity of the pain to allow their pain levels to improve to allow for better function. They are aware of that pain medications cannot totally remove their pain.  Due for UDT ( at least once per year) : will do today  Patient claims compliance with diabetes medication. No obvious side effects. Reports no substantial low sugar spells. Most numbers are generally in good range when checked fasting. Generally does not miss a dose of medication. Watching diabetic diet closely  Blood pressure medicine and blood pressure levels reviewed today with patient. Compliant with blood pressure medicine. States does not miss a dose. No obvious side effects. Blood pressure generally good when checked elsewhere. Watching salt intake.        Review of Systems No headache, no major weight loss or weight gain, no chest pain no back pain abdominal pain no change in bowel habits complete ROS otherwise negative     Objective:   Physical Exam Alert and oriented, vitals reviewed and stable, NAD ENT-TM's and ext canals WNL bilat via otoscopic exam Soft palate, tonsils and post pharynx WNL via oropharyngeal  exam Neck-symmetric, no masses; thyroid nonpalpable and nontender Pulmonary-no tachypnea or accessory muscle use; Clear without wheezes via auscultation Card--no abnrml murmurs, rhythm reg and rate WNL Carotid pulses symmetric, without bruits Prostate exam within normal limits  Feet bilateral intact pulses slight diminished sensation plantar surfaces some venous stasis changes  Results for orders placed or performed in visit on 04/28/19  ToxASSURE Select 13 (MW), Urine  Result Value Ref Range   Summary Note   POCT HgB A1C  Result Value Ref Range   Hemoglobin A1C 5.5 4.0 - 5.6 %   HbA1c POC (<> result, manual entry)     HbA1c, POC (prediabetic range)     HbA1c, POC (controlled diabetic range)          Assessment & Plan:  Impression courtesy prevention discussion.  Next colon due in four yrs.  Diet exercise discussed.  Vaccines discussed  2.  Chronic pain  Impression: Chronic pain. Patient compliant with medication. No substantial side effects. [place] controlled substance registry reviewed to ensure compliance and proper use of medication. Patient aware goal of medicine is not complete resolution of pain but to control his symptoms to improve his functional capacity. Aware of potential adverse side effects  #3 type 2 diabetes.  Excellent control A1c 5.5  3.  Hypertension good control discussed  3.  Depression.  Clinically stable.  Patient states current meds working refill  Follow-up in 3 months.  Pain medicines refilled.  Chronic meds refilled.

## 2019-05-02 LAB — TOXASSURE SELECT 13 (MW), URINE

## 2019-06-27 DIAGNOSIS — T1501XA Foreign body in cornea, right eye, initial encounter: Secondary | ICD-10-CM | POA: Diagnosis not present

## 2019-07-21 ENCOUNTER — Other Ambulatory Visit: Payer: Self-pay | Admitting: Family Medicine

## 2019-08-22 ENCOUNTER — Other Ambulatory Visit: Payer: Self-pay

## 2019-08-22 ENCOUNTER — Encounter: Payer: Self-pay | Admitting: Family Medicine

## 2019-08-22 ENCOUNTER — Ambulatory Visit (INDEPENDENT_AMBULATORY_CARE_PROVIDER_SITE_OTHER): Payer: Medicare HMO | Admitting: Family Medicine

## 2019-08-22 VITALS — BP 140/80 | Temp 98.0°F | Wt 219.2 lb

## 2019-08-22 DIAGNOSIS — E1100 Type 2 diabetes mellitus with hyperosmolarity without nonketotic hyperglycemic-hyperosmolar coma (NKHHC): Secondary | ICD-10-CM | POA: Diagnosis not present

## 2019-08-22 DIAGNOSIS — G8929 Other chronic pain: Secondary | ICD-10-CM | POA: Diagnosis not present

## 2019-08-22 DIAGNOSIS — Z125 Encounter for screening for malignant neoplasm of prostate: Secondary | ICD-10-CM | POA: Diagnosis not present

## 2019-08-22 DIAGNOSIS — F5101 Primary insomnia: Secondary | ICD-10-CM

## 2019-08-22 DIAGNOSIS — I10 Essential (primary) hypertension: Secondary | ICD-10-CM

## 2019-08-22 DIAGNOSIS — F1190 Opioid use, unspecified, uncomplicated: Secondary | ICD-10-CM

## 2019-08-22 DIAGNOSIS — M5440 Lumbago with sciatica, unspecified side: Secondary | ICD-10-CM | POA: Diagnosis not present

## 2019-08-22 LAB — POCT GLYCOSYLATED HEMOGLOBIN (HGB A1C): Hemoglobin A1C: 5.3 % (ref 4.0–5.6)

## 2019-08-22 NOTE — Progress Notes (Signed)
Patient ID: Dennis Mitchell, male    DOB: September 07, 1950, 69 y.o.   MRN: [id number]   Chief Complaint  Patient presents with  . Encounter for long-term opiate analgesic use  . Neck Pain    chronic   Subjective:    HPI This patient was seen today for chronic pain, pt stating it's in his neck and chronic back pain.  The medication list was reviewed and updated.   -Compliance with medication: yes  - Number patient states they take daily: typically takes 1 per day. Had a bad fall a couple weeks ago and for a few days took 2-3 per day PRN.  -when was the last dose patient took? This AM  The patient was advised the importance of maintaining medication and not using illegal substances with these.  The patient was educated that we can provide 3 monthly scripts for their medication, it is their responsibility to follow the instructions.  Side effects or complications from medications: none  Patient is aware that pain medications are meant to minimize the severity of the pain to allow their pain levels to improve to allow for better function. They are aware of that pain medications cannot totally remove their pain.  Due for UDT ( at least once per year) : 04/28/19   Results for orders placed or performed in visit on 08/22/19  POCT glycosylated hemoglobin (Hb A1C)  Result Value Ref Range   Hemoglobin A1C 5.3 4.0 - 5.6 %   HbA1c POC (<> result, manual entry)     HbA1c, POC (prediabetic range)     HbA1c, POC (controlled diabetic range)         Medical History Dennis Mitchell has a past medical history of Anxiety, Arthritis, Back pain, Depression, Diabetes mellitus, Dysphagia, Fatigue, History of hiatal hernia, Hypercholesteremia, Hypertension, Neuropathic pain, Skin cancer of face, Sleep apnea, and Trauma (2010).   Outpatient Encounter Medications as of 08/22/2019  Medication Sig  . amLODipine (NORVASC) 5 MG tablet Take one tablet po each morning  . citalopram (CELEXA) 40 MG tablet Take 1  tablet (40 mg total) by mouth daily.  . cyclobenzaprine (FLEXERIL) 10 MG tablet Take 1 tablet (10 mg total) by mouth 3 (three) times daily as needed for muscle spasms.  . diclofenac sodium (VOLTAREN) 1 % GEL APPLY TOPICALLY TWICE DAILY TO AFFECTED AREA AS NEEDED FOR PAIN (Patient taking differently: Apply 2 g topically 2 (two) times daily as needed (pain knee). )  . EPINEPHrine 0.3 mg/0.3 mL IJ SOAJ injection Inject 0.3 mLs (0.3 mg total) into the muscle as needed for anaphylaxis.  . hydrochlorothiazide (HYDRODIURIL) 25 MG tablet Take 1 tablet (25 mg total) by mouth daily.  . hydrOXYzine (VISTARIL) 25 MG capsule Take 1 capsule (25 mg total) by mouth every 6 (six) hours as needed for itching. TAKE ONE CAPSULE BY MOUTH EVERY 4 TO 6 HOURS AS NEEDED FOR  ITCHING  . LORazepam (ATIVAN) 2 MG tablet Take 1 tablet (2 mg total) by mouth at bedtime as needed for sleep. TAKE 1 TABLET BY MOUTH AT BEDTIME AS NEEDED FOR SLEEP  . meclizine (ANTIVERT) 25 MG tablet TAKE 1 TABLET BY MOUTH THREE TIMES DAILY AS NEEDED FOR DIZZINESS  . oxyCODONE-acetaminophen (PERCOCET/ROXICET) 5-325 MG tablet TAKE ONE UP TO BID PRN PAIN  . oxyCODONE-acetaminophen (PERCOCET/ROXICET) 5-325 MG tablet Take one tablet po up to BID prn pain  . oxyCODONE-acetaminophen (PERCOCET/ROXICET) 5-325 MG tablet Take one tablet po up to BID prn pain  . Potassium 99 MG  TABS Take 99 mg by mouth daily.  . sildenafil (REVATIO) 20 MG tablet TAKE 5 TABLETS BY MOUTH EVERY DAY AS NEEDED 2 HOURS BEFORE SEX (Patient taking differently: Take 100 mg by mouth as needed (ED). AS NEEDED 2 HOURS BEFORE SEX)   No facility-administered encounter medications on file as of 08/22/2019.     Review of Systems  Constitutional: Negative for chills and fever.  HENT: Negative for congestion, rhinorrhea and sore throat.   Respiratory: Negative for cough, shortness of breath and wheezing.   Cardiovascular: Negative for chest pain and leg swelling.  Gastrointestinal: Negative  for abdominal pain, diarrhea, nausea and vomiting.  Genitourinary: Negative for dysuria and frequency.  Musculoskeletal:       +chronic neck and back pain.  Skin: Negative for rash.  Neurological: Negative for dizziness, weakness and headaches.     Vitals BP 140/80   Temp 98 F (36.7 C)   Wt 219 lb 3.2 oz (99.4 kg)   BMI 34.33 kg/m   Objective:   Physical Exam Constitutional:      General: He is not in acute distress.    Appearance: Normal appearance. He is not ill-appearing.  Cardiovascular:     Rate and Rhythm: Normal rate and regular rhythm.     Pulses: Normal pulses.     Heart sounds: Normal heart sounds.  Pulmonary:     Effort: Pulmonary effort is normal. No respiratory distress.     Breath sounds: Normal breath sounds.  Musculoskeletal:        General: No swelling, tenderness or deformity.     Comments: +dec rom with rotation to left and right of neck.  Normal rom with back and arms.   Skin:    General: Skin is warm and dry.     Findings: No rash.  Neurological:     General: No focal deficit present.     Mental Status: He is alert.     Cranial Nerves: No cranial nerve deficit.  Psychiatric:        Mood and Affect: Mood normal.        Behavior: Behavior normal.        Thought Content: Thought content normal.        Judgment: Judgment normal.      Assessment and Plan   1. Chronic low back pain with sciatica, sciatica laterality unspecified, unspecified back pain laterality - Ambulatory referral to Pain Clinic  2. Type 2 diabetes mellitus with hyperosmolarity without coma, without long-term current use of insulin (HCC) - POCT glycosylated hemoglobin (Hb A1C) - CMP14+EGFR - Hemoglobin A1c  3. Essential hypertension, benign - CBC - CMP14+EGFR - Lipid panel  4. Screening PSA (prostate specific antigen) - PSA  5. Chronic, continuous use of opioids - Ambulatory referral to Pain Clinic  6. Primary insomnia - Ambulatory referral to Psychiatry    HTN-suboptimal.  Will cont current meds.  Dec salt.  pt to obtain labs and f/u in 66mo DM2- cont diet modifications and obtain labs.  I had a long discussion with the patient about the safety concerns of taking opiates and anxiety medications together over long periods of time.  I don't typically prescribe these medications chronically in patients over 684yrs old.   It increases risk for memory concerns, early dementia and increased risk of falls.  Combining sedatives, anxiety medications, and pain medications puts patients at risk for respiratory depression and possibly death.   In addition, there is newer guidelines about treating patients with opiate  medications and sedatives/anxiety meds (like Xanax, ativan, klonapin and ambien).    It is no longer safe to continue these medications together and we would need to start to taper off some of these medications. The goal is to decrease or change these medications over time to something safer in elderly patients.   I offered to help patient with alternative medications or a referral to pain or psychiatry specialist if patient is not able to taper off the medications or does not desire to try to taper off the medications or try alternatives.   Pt was given 30 day supply of medications and referral to pain management and psychiatry.  Pt voiced understanding.  F/u 94mofor f/u dm and htn or prn.

## 2019-08-22 NOTE — Patient Instructions (Signed)
Get your fasting labs 1 wk prior to next appt.  Follow up with psychiatrist for sleep medications and with pain management for your chronic neck pain.

## 2019-08-29 ENCOUNTER — Encounter: Payer: Self-pay | Admitting: Family Medicine

## 2019-09-07 ENCOUNTER — Encounter: Payer: Self-pay | Admitting: Family Medicine

## 2019-10-04 ENCOUNTER — Telehealth: Payer: Self-pay | Admitting: Family Medicine

## 2019-10-04 NOTE — Telephone Encounter (Signed)
Yes, pls order 60 tab of citalopram 40mg  tab.  No refills, pt to follow up at next visit. Thx. Dr. Lovena Le

## 2019-10-04 NOTE — Telephone Encounter (Signed)
[facility] requesting refill on Citalopram 40 mg tablet. Take one tablet po once daily. Pt last seen 08/22/19 for low back pain. Please advise. Thank you

## 2019-10-05 MED ORDER — CITALOPRAM HYDROBROMIDE 40 MG PO TABS: 40.0000 mg | ORAL_TABLET | Freq: Every day | ORAL | 0 refills | Status: DC

## 2019-10-05 NOTE — Telephone Encounter (Signed)
Medication sent to pharmacy  

## 2019-10-05 NOTE — Addendum Note (Signed)
Addended by: Vicente Males on: 10/05/2019 08:30 AM   Modules accepted: Orders

## 2019-10-24 ENCOUNTER — Other Ambulatory Visit: Payer: Self-pay

## 2019-10-24 ENCOUNTER — Ambulatory Visit (INDEPENDENT_AMBULATORY_CARE_PROVIDER_SITE_OTHER): Payer: Medicare HMO | Admitting: Nurse Practitioner

## 2019-10-24 ENCOUNTER — Encounter (INDEPENDENT_AMBULATORY_CARE_PROVIDER_SITE_OTHER): Payer: Self-pay | Admitting: Nurse Practitioner

## 2019-10-24 VITALS — BP 143/71 | HR 78 | Temp 97.1°F | Resp 18 | Ht 67.0 in | Wt 221.0 lb

## 2019-10-24 DIAGNOSIS — I491 Atrial premature depolarization: Secondary | ICD-10-CM | POA: Diagnosis not present

## 2019-10-24 DIAGNOSIS — Z23 Encounter for immunization: Secondary | ICD-10-CM

## 2019-10-24 DIAGNOSIS — R5383 Other fatigue: Secondary | ICD-10-CM

## 2019-10-24 DIAGNOSIS — F321 Major depressive disorder, single episode, moderate: Secondary | ICD-10-CM

## 2019-10-24 DIAGNOSIS — I10 Essential (primary) hypertension: Secondary | ICD-10-CM

## 2019-10-24 DIAGNOSIS — E559 Vitamin D deficiency, unspecified: Secondary | ICD-10-CM | POA: Diagnosis not present

## 2019-10-24 DIAGNOSIS — Z8639 Personal history of other endocrine, nutritional and metabolic disease: Secondary | ICD-10-CM | POA: Diagnosis not present

## 2019-10-24 NOTE — Progress Notes (Addendum)
Subjective:  Patient ID: Dennis Mitchell, male    DOB: 06/27/1950  Age: 69 y.o. MRN: [id number]  CC:  Chief Complaint  Patient presents with  . Establish Care      HPI  This patient arrives today for the above.  He is coming to this office because his previous medical doctor retired. He does have a history significant for hypertension, depression, back injury with resultant back pain, obesity, and type 2 diabetes which seems to be much better controlled since losing weight. He tells me his biggest concern is that he is quite tired and is not sure what could be causing this. He would like to be evaluated for that today.   Past Medical History:  Diagnosis Date  . Anxiety   . Arthritis   . Back pain   . Depression   . Diabetes mellitus    due to weight loss, lost 70 lbs. due to gastric bypass   . Dysphagia   . Fatigue   . History of hiatal hernia   . Hypercholesteremia   . Hypertension   . Neuropathic pain   . Skin cancer of face   . Sleep apnea    Cpap  . Trauma 2010   multiple traumas- neck, back, ribs, collar bones, - resulted in prolonged hosp. & rehab stay , with trach      No family history on file.  Social History   Social History Narrative  . Not on file   Social History   Tobacco Use  . Smoking status: Current Every Day Smoker    Packs/day: 0.50    Years: 40.00    Pack years: 20.00    Types: Cigarettes  . Smokeless tobacco: Never Used  . Tobacco comment: Using electric cigarettes  Substance Use Topics  . Alcohol use: Yes    Alcohol/week: 10.0 standard drinks    Types: 10 Cans of beer per week     Current Meds  Medication Sig  . amLODipine (NORVASC) 5 MG tablet Take one tablet po each morning  . citalopram (CELEXA) 40 MG tablet Take 1 tablet (40 mg total) by mouth daily.  . cyclobenzaprine (FLEXERIL) 10 MG tablet Take 1 tablet (10 mg total) by mouth 3 (three) times daily as needed for muscle spasms.  . diclofenac sodium (VOLTAREN) 1 %  GEL APPLY TOPICALLY TWICE DAILY TO AFFECTED AREA AS NEEDED FOR PAIN (Patient taking differently: Apply 2 g topically 2 (two) times daily as needed (pain knee). )  . EPINEPHrine 0.3 mg/0.3 mL IJ SOAJ injection Inject 0.3 mLs (0.3 mg total) into the muscle as needed for anaphylaxis.  . hydrochlorothiazide (HYDRODIURIL) 25 MG tablet Take 1 tablet (25 mg total) by mouth daily.  . hydrOXYzine (VISTARIL) 25 MG capsule Take 1 capsule (25 mg total) by mouth every 6 (six) hours as needed for itching. TAKE ONE CAPSULE BY MOUTH EVERY 4 TO 6 HOURS AS NEEDED FOR  ITCHING  . meclizine (ANTIVERT) 25 MG tablet TAKE 1 TABLET BY MOUTH THREE TIMES DAILY AS NEEDED FOR DIZZINESS  . Potassium 99 MG TABS Take 99 mg by mouth daily.  . sildenafil (REVATIO) 20 MG tablet TAKE 5 TABLETS BY MOUTH EVERY DAY AS NEEDED 2 HOURS BEFORE SEX (Patient taking differently: Take 100 mg by mouth as needed (ED). AS NEEDED 2 HOURS BEFORE SEX)    ROS:  Review of Systems  Constitutional: Positive for malaise/fatigue. Negative for fever.  Eyes: Negative for blurred vision.  Respiratory: Negative for  shortness of breath.   Cardiovascular: Negative for chest pain.  Gastrointestinal: Negative for blood in stool.  Neurological: Positive for dizziness (With position changes). Negative for loss of consciousness and headaches.  Psychiatric/Behavioral: Negative for suicidal ideas.     Objective:   Today's Vitals: BP (!) 143/71 (BP Location: Left Arm, Patient Position: Sitting, Cuff Size: Normal)   Pulse 78   Temp (!) 97.1 F (36.2 C) (Temporal)   Resp 18   Ht 5' 7"  (1.702 m)   Wt 221 lb (100.2 kg)   BMI 34.61 kg/m  Vitals with BMI 10/24/2019 08/22/2019 04/28/2019  Height 5' 7"  - -  Weight 221 lbs 219 lbs 3 oz 223 lbs 13 oz  BMI 07.37 - -  Systolic 106 269 485  Diastolic 71 80 80  Pulse 78 - -     Physical Exam Vitals reviewed.  Constitutional:      Appearance: Normal appearance.  HENT:     Head: Normocephalic and atraumatic.    Cardiovascular:     Rate and Rhythm: Normal rate. Rhythm irregular.  Pulmonary:     Effort: Pulmonary effort is normal.     Breath sounds: Normal breath sounds.  Musculoskeletal:     Cervical back: Neck supple.  Skin:    General: Skin is warm and dry.  Neurological:     Mental Status: He is alert and oriented to person, place, and time.  Psychiatric:        Mood and Affect: Mood normal.        Behavior: Behavior normal.        Thought Content: Thought content normal.        Judgment: Judgment normal.        EKG: Sinus rhythm with PAC bigeminy  Assessment and Plan   1. Fatigue, unspecified type   2. Essential hypertension, benign   3. History of diabetes mellitus   4. Current moderate episode of major depressive disorder without prior episode ([place])   5. Flu vaccine need   6. Need for influenza vaccination   7. PAC (premature atrial contraction)      Plan: 1., 3. We will collect blood work for further evaluation. Further recommendations were made based upon these results. 2. He will continue on his current antihypertensives, may need to consider adjusting medications if blood pressure remains above goal at next office visit. 4. He will continue on his medications for depression as currently prescribed. 5., 6. We will administer flu shot today. 7. Reviewed EKG with Dr. Anastasio Champion, because previous EKG did not show any PACs specially not bigeminy we decided it would be best to refer him to cardiology for evaluation.   Tests ordered Orders Placed This Encounter  Procedures  . Flu Vaccine QUAD High Dose([facility])  . CMP with eGFR([facility])  . CBC with Differential/Platelets  . Lipid Panel  . Hemoglobin A1c  . TSH  . Vitamin D, 25-hydroxy  . Urinalysis with Reflex Microscopic  . T3, Free  . T4, Free      No orders of the defined types were placed in this encounter.   Patient to follow-up in 1 month or sooner as needed.  Ailene Ards, NP

## 2019-10-24 NOTE — Addendum Note (Signed)
Addended by: Jeralyn Ruths E on: 10/24/2019 10:39 AM   Modules accepted: Orders

## 2019-10-25 LAB — COMPLETE METABOLIC PANEL WITH GFR
AG Ratio: 1.3 (calc) (ref 1.0–2.5)
ALT: 13 U/L (ref 9–46)
AST: 16 U/L (ref 10–35)
Albumin: 4.1 g/dL (ref 3.6–5.1)
Alkaline phosphatase (APISO): 68 U/L (ref 35–144)
BUN: 22 mg/dL (ref 7–25)
CO2: 32 mmol/L (ref 20–32)
Calcium: 9.7 mg/dL (ref 8.6–10.3)
Chloride: 99 mmol/L (ref 98–110)
Creat: 1.17 mg/dL (ref 0.70–1.25)
GFR, Est African American: 73 mL/min/{1.73_m2} (ref >=60)
GFR, Est Non African American: 63 mL/min/{1.73_m2} (ref >=60)
Globulin: 3.2 g/dL (calc) (ref 1.9–3.7)
Glucose, Bld: 117 mg/dL — ABNORMAL HIGH (ref 65–99)
Potassium: 4.9 mmol/L (ref 3.5–5.3)
Sodium: 140 mmol/L (ref 135–146)
Total Bilirubin: 0.8 mg/dL (ref 0.2–1.2)
Total Protein: 7.3 g/dL (ref 6.1–8.1)

## 2019-10-25 LAB — CBC WITH DIFFERENTIAL/PLATELET
Absolute Monocytes: 611 cells/uL (ref 200–950)
Basophils Absolute: 43 cells/uL (ref 0–200)
Basophils Relative: 0.6 %
Eosinophils Absolute: 128 cells/uL (ref 15–500)
Eosinophils Relative: 1.8 %
HCT: 44.8 % (ref 38.5–50.0)
Hemoglobin: 15.6 g/dL (ref 13.2–17.1)
Lymphs Abs: 1051 cells/uL (ref 850–3900)
MCH: 32.1 pg (ref 27.0–33.0)
MCHC: 34.8 g/dL (ref 32.0–36.0)
MCV: 92.2 fL (ref 80.0–100.0)
MPV: 9.9 fL (ref 7.5–12.5)
Monocytes Relative: 8.6 %
Neutro Abs: 5268 cells/uL (ref 1500–7800)
Neutrophils Relative %: 74.2 %
Platelets: 182 10*3/uL (ref 140–400)
RBC: 4.86 10*6/uL (ref 4.20–5.80)
RDW: 12.9 % (ref 11.0–15.0)
Total Lymphocyte: 14.8 %
WBC: 7.1 10*3/uL (ref 3.8–10.8)

## 2019-10-25 LAB — HEMOGLOBIN A1C
Hgb A1c MFr Bld: 6.1 % of total Hgb — ABNORMAL HIGH (ref <5.7)
Mean Plasma Glucose: 128 (calc)
eAG (mmol/L): 7.1 (calc)

## 2019-10-25 LAB — URINALYSIS, ROUTINE W REFLEX MICROSCOPIC
Bacteria, UA: NONE SEEN /HPF
Bilirubin Urine: NEGATIVE
Glucose, UA: NEGATIVE
Hgb urine dipstick: NEGATIVE
Hyaline Cast: NONE SEEN /LPF
Ketones, ur: NEGATIVE
Leukocytes,Ua: NEGATIVE
Nitrite: NEGATIVE
RBC / HPF: NONE SEEN /HPF (ref 0–2)
Specific Gravity, Urine: 1.013 (ref 1.001–1.03)
Squamous Epithelial / HPF: NONE SEEN /HPF (ref <=5)
WBC, UA: NONE SEEN /HPF (ref 0–5)
pH: 7.5 (ref 5.0–8.0)

## 2019-10-25 LAB — T3, FREE: T3, Free: 3.4 pg/mL (ref 2.3–4.2)

## 2019-10-25 LAB — TSH: TSH: 2.39 mIU/L (ref 0.40–4.50)

## 2019-10-25 LAB — LIPID PANEL
Cholesterol: 195 mg/dL (ref <200)
HDL: 73 mg/dL (ref >=40)
LDL Cholesterol (Calc): 102 mg/dL (calc) — ABNORMAL HIGH
Non-HDL Cholesterol (Calc): 122 mg/dL (calc) (ref <130)
Total CHOL/HDL Ratio: 2.7 (calc) (ref <5.0)
Triglycerides: 100 mg/dL (ref <150)

## 2019-10-25 LAB — T4, FREE: Free T4: 1.3 ng/dL (ref 0.8–1.8)

## 2019-10-25 LAB — VITAMIN D 25 HYDROXY (VIT D DEFICIENCY, FRACTURES): Vit D, 25-Hydroxy: 26 ng/mL — ABNORMAL LOW (ref 30–100)

## 2019-11-06 ENCOUNTER — Ambulatory Visit (INDEPENDENT_AMBULATORY_CARE_PROVIDER_SITE_OTHER): Payer: Medicare HMO | Admitting: Interventional Cardiology

## 2019-11-06 ENCOUNTER — Encounter: Payer: Self-pay | Admitting: Interventional Cardiology

## 2019-11-06 ENCOUNTER — Other Ambulatory Visit: Payer: Self-pay

## 2019-11-06 VITALS — BP 160/68 | HR 57 | Ht 67.0 in | Wt 224.6 lb

## 2019-11-06 DIAGNOSIS — I491 Atrial premature depolarization: Secondary | ICD-10-CM | POA: Diagnosis not present

## 2019-11-06 DIAGNOSIS — I10 Essential (primary) hypertension: Secondary | ICD-10-CM | POA: Diagnosis not present

## 2019-11-06 DIAGNOSIS — Z720 Tobacco use: Secondary | ICD-10-CM | POA: Diagnosis not present

## 2019-11-06 NOTE — Progress Notes (Signed)
Cardiology Office Note   Date:  11/06/2019   ID:  Dennis Mitchell, DOB 1950-09-26, MRN [id number]  PCP:  Doree Albee, MD    No chief complaint on file.  PAC  Wt Readings from Last 3 Encounters:  11/06/19 224 lb 9.6 oz (101.9 kg)  10/24/19 221 lb (100.2 kg)  08/22/19 219 lb 3.2 oz (99.4 kg)       History of Present Illness: Dennis Mitchell is a 69 y.o. male who is being seen today for the evaluation of PAC at the request of Ailene Ards, NP.  PACs noted in the past with his PMD.  He was referred for further eval.    Denies : Chest pain. Dizziness. Leg edema. Nitroglycerin use. Orthopnea. Palpitations. Paroxysmal nocturnal dyspnea. Shortness of breath. Syncope.   Uses a vape-pen to stop smoking.    He had a stress test many years ago- he did fine with this test. "Myoview scan. Normal perfusion. LVF on gating calculated at 62%" in 2006.  He has had PACs noted, although he is asymptomatic.   E does drink 4-5 cups of caffeinated coffee.  He was told about an irregularity in 2006 at the time of stress test, and was told to drink decaf.  He has more recently gone back to regular caffeine.    BP high in the MDs office.  At home it is better- 140/70 range.   Past Medical History:  Diagnosis Date  . Anxiety   . Arthritis   . Back pain   . Depression   . Diabetes mellitus    due to weight loss, lost 70 lbs. due to gastric bypass   . Dysphagia   . Fatigue   . History of hiatal hernia   . Hypercholesteremia   . Hypertension   . Neuropathic pain   . Skin cancer of face   . Sleep apnea    Cpap  . Trauma 2010   multiple traumas- neck, back, ribs, collar bones, - resulted in prolonged hosp. & rehab stay , with trach    Past Surgical History:  Procedure Laterality Date  . ANTERIOR CERVICAL DECOMP/DISCECTOMY FUSION N/A 12/26/2018   Procedure: Anterior Cervical Decompression Fusion - Cervical Four - Cervical Five - Cervical Five - Cervical Six - Cervical Six -  Cervical Seven;  Surgeon: Earnie Larsson, MD;  Location: [facility];  Service: Neurosurgery;  Laterality: N/A;  Anterior Cervical Decompression Fusion - Cervical Four - Cervical Five - Cervical Five - Cervical Six - Cervical Six - Cervical Seven  . BREATH TEK H PYLORI N/A 03/01/2014   Procedure: BREATH TEK H PYLORI;  Surgeon: Alphonsa Overall, MD;  Location: [facility];  Service: General;  Laterality: N/A;  . COLONOSCOPY N/A 09/20/2013   Procedure: COLONOSCOPY;  Surgeon: Rogene Houston, MD;  [facility];  Service: Endoscopy;  Laterality: N/A;  930  . DOG BITE    . LAPAROSCOPIC GASTRIC SLEEVE RESECTION WITH HIATAL HERNIA REPAIR N/A 03/19/2015   Procedure: LAPAROSCOPIC GASTRIC SLEEVE RESECTION WITH  HIATAL HERNIA REPAIR;  Surgeon: Alphonsa Overall, MD;  Location: [facility];  Service: General;  Laterality: N/A;  . TRACHEOSTOMY  2010  . UPPER GI ENDOSCOPY  03/19/2015   Procedure: UPPER GI ENDOSCOPY;  Surgeon: Alphonsa Overall, MD;  Location: [facility];  Service: General;;     Current Outpatient Medications  Medication Sig Dispense Refill  . amLODipine (NORVASC) 5 MG tablet Take one tablet po each morning 90 tablet 1  .  citalopram (CELEXA) 40 MG tablet Take 1 tablet (40 mg total) by mouth daily. 60 tablet 0  . cyclobenzaprine (FLEXERIL) 10 MG tablet Take 1 tablet (10 mg total) by mouth 3 (three) times daily as needed for muscle spasms. 30 tablet 0  . diclofenac sodium (VOLTAREN) 1 % GEL APPLY TOPICALLY TWICE DAILY TO AFFECTED AREA AS NEEDED FOR PAIN (Patient taking differently: Apply 2 g topically 2 (two) times daily as needed (pain knee). ) 100 g 11  . EPINEPHrine 0.3 mg/0.3 mL IJ SOAJ injection Inject 0.3 mLs (0.3 mg total) into the muscle as needed for anaphylaxis. 1 each 0  . hydrochlorothiazide (HYDRODIURIL) 25 MG tablet Take 1 tablet (25 mg total) by mouth daily. 90 tablet 1  . hydrOXYzine (VISTARIL) 25 MG capsule Take 1 capsule (25 mg total) by mouth every 6 (six) hours as needed for itching. TAKE ONE CAPSULE  BY MOUTH EVERY 4 TO 6 HOURS AS NEEDED FOR  ITCHING 36 capsule 0  . meclizine (ANTIVERT) 25 MG tablet TAKE 1 TABLET BY MOUTH THREE TIMES DAILY AS NEEDED FOR DIZZINESS 90 tablet 0  . Potassium 99 MG TABS Take 99 mg by mouth daily.    . sildenafil (REVATIO) 20 MG tablet TAKE 5 TABLETS BY MOUTH EVERY DAY AS NEEDED 2 HOURS BEFORE SEX (Patient taking differently: Take 100 mg by mouth as needed (ED). AS NEEDED 2 HOURS BEFORE SEX) 600 tablet 0   No current facility-administered medications for this visit.    Allergies:   Bee venom, Aspirin, Lisinopril, and Penicillins    Social History:  The patient  reports that he has been smoking cigarettes. He has a 20.00 pack-year smoking history. He has never used smokeless tobacco. He reports current alcohol use of about 10.0 standard drinks of alcohol per week. He reports that he does not use drugs.   Family History:  The patient's family history includes Hypertension in his brother, father, and sister.    ROS:  Please see the history of present illness.   Otherwise, review of systems are positive for .   All other systems are reviewed and negative.    PHYSICAL EXAM: VS:  BP (!) 160/68   Pulse (!) 57   Ht 5\' 7"  (1.702 m)   Wt 224 lb 9.6 oz (101.9 kg)   SpO2 98%   BMI 35.18 kg/m  , BMI Body mass index is 35.18 kg/m. GEN: Well nourished, well developed, in no acute distress  HEENT: normal  Neck: no JVD, carotid bruits, or masses Cardiac: RRR, premature beats; no murmurs, rubs, or gallops,no edema  Respiratory:  clear to auscultation bilaterally, normal work of breathing GI: soft, nontender, nondistended, + BS MS: no deformity or atrophy  Skin: warm and dry, no rash Neuro:  Strength and sensation are intact Psych: euthymic mood, full affect   EKG:   The ekg ordered today demonstrates NSR, PACs, no ST segment changes   Recent Labs: 10/24/2019: ALT 13; BUN 22; Creat 1.17; Hemoglobin 15.6; Platelets 182; Potassium 4.9; Sodium 140; TSH 2.39    Lipid Panel    Component Value Date/Time   CHOL 195 10/24/2019 1027   CHOL 159 12/06/2018 0814   TRIG 100 10/24/2019 1027   HDL 73 10/24/2019 1027   HDL 65 12/06/2018 0814   CHOLHDL 2.7 10/24/2019 1027   VLDL 16 08/07/2014 0829   LDLCALC 102 (H) 10/24/2019 1027   LDLDIRECT  01/03/2009 0555    87 (NOTE) ATP III Classification (LDL):      <  100        mg/dL         Optimal     100 - 129     mg/dL         Near or Above Optimal     130 - 159     mg/dL         Borderline High     160 - 189     mg/dL         High      > 190        mg/dL         Very  High      Other studies Reviewed: Additional studies/ records that were reviewed today with results demonstrating: 2006 stress test reviewed.   ASSESSMENT AND PLAN:  1. PAC: Decrease caffeine intake.  Asymptomatic.  Would not use beta blocker for PACs since he is not having sx.  No CHF signs.  Continue preventive therapy.  2. HTN: Borderline.  Decreased caffeine may help.  Regular exercise.  Low salt.  WOuld need to consider increase in amlodipine if BP does not stay below target.  3. Tobacco abuse: Now using vape.  Not smoking.  I asked him to try to wean of the vape pen.   4. He has been vaccinated against COVID.    Current medicines are reviewed at length with the patient today.  The patient concerns regarding his medicines were addressed.  The following changes have been made:  No change  Labs/ tests ordered today include:  No orders of the defined types were placed in this encounter.   Recommend 150 minutes/week of aerobic exercise Low fat, low carb, high fiber diet recommended  Disposition:   FU as needed   Signed, Larae Grooms, MD  11/06/2019 10:12 AM    [address], [place], [place]  [address] Phone: [phone]; Fax: [phone]

## 2019-11-06 NOTE — Patient Instructions (Signed)
Medication Instructions:  Your physician recommends that you continue on your current medications as directed. Please refer to the Current Medication list given to you today.  *If you need a refill on your cardiac medications before your next appointment, please call your pharmacy*   Lab Work: None  If you have labs (blood work) drawn today and your tests are completely normal, you will receive your results only by: Marland Kitchen MyChart Message (if you have MyChart) OR . A paper copy in the mail If you have any lab test that is abnormal or we need to change your treatment, we will call you to review the results.   Testing/Procedures: None  Follow-Up: AS NEEDED   Other Instructions Decrease your caffeine intake

## 2019-11-15 ENCOUNTER — Other Ambulatory Visit: Payer: Self-pay | Admitting: Family Medicine

## 2019-11-21 ENCOUNTER — Other Ambulatory Visit: Payer: Self-pay | Admitting: Family Medicine

## 2019-11-22 ENCOUNTER — Ambulatory Visit: Payer: Medicare HMO | Admitting: Family Medicine

## 2019-11-23 ENCOUNTER — Other Ambulatory Visit (INDEPENDENT_AMBULATORY_CARE_PROVIDER_SITE_OTHER): Payer: Self-pay | Admitting: Internal Medicine

## 2019-11-23 ENCOUNTER — Telehealth (INDEPENDENT_AMBULATORY_CARE_PROVIDER_SITE_OTHER): Payer: Self-pay | Admitting: Internal Medicine

## 2019-11-23 MED ORDER — MECLIZINE HCL 25 MG PO TABS: 25.0000 mg | ORAL_TABLET | Freq: Three times a day (TID) | ORAL | 0 refills | Status: DC | PRN

## 2019-11-27 NOTE — Telephone Encounter (Signed)
done

## 2019-12-11 ENCOUNTER — Other Ambulatory Visit: Payer: Self-pay

## 2019-12-11 ENCOUNTER — Ambulatory Visit (INDEPENDENT_AMBULATORY_CARE_PROVIDER_SITE_OTHER): Payer: Medicare HMO | Admitting: Internal Medicine

## 2019-12-11 ENCOUNTER — Encounter (INDEPENDENT_AMBULATORY_CARE_PROVIDER_SITE_OTHER): Payer: Self-pay | Admitting: Internal Medicine

## 2019-12-11 VITALS — BP 140/82 | HR 89 | Temp 97.6°F | Resp 18 | Ht 67.0 in | Wt 245.0 lb

## 2019-12-11 DIAGNOSIS — Z125 Encounter for screening for malignant neoplasm of prostate: Secondary | ICD-10-CM

## 2019-12-11 DIAGNOSIS — I10 Essential (primary) hypertension: Secondary | ICD-10-CM | POA: Diagnosis not present

## 2019-12-11 DIAGNOSIS — E559 Vitamin D deficiency, unspecified: Secondary | ICD-10-CM

## 2019-12-11 DIAGNOSIS — R7303 Prediabetes: Secondary | ICD-10-CM | POA: Diagnosis not present

## 2019-12-11 DIAGNOSIS — N529 Male erectile dysfunction, unspecified: Secondary | ICD-10-CM | POA: Diagnosis not present

## 2019-12-11 NOTE — Progress Notes (Signed)
Metrics: Intervention Frequency ACO  Documented Smoking Status Yearly  Screened one or more times in 24 months  Cessation Counseling or  Active cessation medication Past 24 months  Past 24 months   Guideline developer: UpToDate (See UpToDate for funding source) Date Released: 2014       Wellness Office Visit  Subjective:  Patient ID: Dennis Mitchell, male    DOB: 1950/06/20  Age: 69 y.o. MRN: [id number]  CC: This man comes in for follow-up of hypertension, morbid obesity, vitamin D deficiency, erectile dysfunction.  HPI He tells me that he had bariatric surgery a long time ago and previously was diabetic on several medications.  The bariatric surgery helped him cure his diabetes but his last hemoglobin A1c in October when he saw Judson Roch was 6.1%.  He is now considered prediabetic.  He lost apparently 70 pounds with gastric bypass surgery. He had a history of chronic back pain and problems and was taking Percocet.  He has not been taking this for the last 1 month at least and uses Tylenol and ibuprofen apparently which seems to be working for him. He does describe erectile dysfunction and decreased libido.  He has had a girlfriend for the last 68 to 12 years.  He has previously asked his previous physician to check testosterone levels but I do not see record of this.  He uses sildenafil for erectile dysfunction.  Past Medical History:  Diagnosis Date  . Anxiety   . Arthritis   . Back pain   . Depression   . Diabetes mellitus    due to weight loss, lost 70 lbs. due to gastric bypass   . Dysphagia   . Fatigue   . History of hiatal hernia   . Hypercholesteremia   . Hypertension   . Neuropathic pain   . Skin cancer of face   . Sleep apnea    Cpap  . Trauma 2010   multiple traumas- neck, back, ribs, collar bones, - resulted in prolonged hosp. & rehab stay , with trach   Past Surgical History:  Procedure Laterality Date  . ANTERIOR CERVICAL DECOMP/DISCECTOMY FUSION N/A 12/26/2018    Procedure: Anterior Cervical Decompression Fusion - Cervical Four - Cervical Five - Cervical Five - Cervical Six - Cervical Six - Cervical Seven;  Surgeon: Earnie Larsson, MD;  Location: [facility];  Service: Neurosurgery;  Laterality: N/A;  Anterior Cervical Decompression Fusion - Cervical Four - Cervical Five - Cervical Five - Cervical Six - Cervical Six - Cervical Seven  . BREATH TEK H PYLORI N/A 03/01/2014   Procedure: BREATH TEK H PYLORI;  Surgeon: Alphonsa Overall, MD;  Location: [facility];  Service: General;  Laterality: N/A;  . COLONOSCOPY N/A 09/20/2013   Procedure: COLONOSCOPY;  Surgeon: Rogene Houston, MD;  [facility];  Service: Endoscopy;  Laterality: N/A;  930  . DOG BITE    . LAPAROSCOPIC GASTRIC SLEEVE RESECTION WITH HIATAL HERNIA REPAIR N/A 03/19/2015   Procedure: LAPAROSCOPIC GASTRIC SLEEVE RESECTION WITH  HIATAL HERNIA REPAIR;  Surgeon: Alphonsa Overall, MD;  [facility];  Service: General;  Laterality: N/A;  . TRACHEOSTOMY  2010  . UPPER GI ENDOSCOPY  03/19/2015   Procedure: UPPER GI ENDOSCOPY;  Surgeon: Alphonsa Overall, MD;  Location: [facility];  Service: General;;     Family History  Problem Relation Age of Onset  . Hypertension Father   . Hypertension Sister   . Hypertension Brother     Social History   Social History  Narrative   Divorced since 2004,second marriage.Lives alone.Part time maintainence of rental properties.   Social History   Tobacco Use  . Smoking status: Former Smoker    Years: 40.00    Types: Cigarettes  . Smokeless tobacco: Never Used  . Tobacco comment: Using electric cigarettes  Substance Use Topics  . Alcohol use: Yes    Alcohol/week: 20.0 standard drinks    Types: 20 Cans of beer per week    Current Meds  Medication Sig  . amLODipine (NORVASC) 5 MG tablet Take one tablet po each morning  . citalopram (CELEXA) 40 MG tablet Take 1 tablet (40 mg total) by mouth daily.  . cyclobenzaprine (FLEXERIL) 10 MG tablet Take 1 tablet (10 mg total)  by mouth 3 (three) times daily as needed for muscle spasms.  . diclofenac sodium (VOLTAREN) 1 % GEL APPLY TOPICALLY TWICE DAILY TO AFFECTED AREA AS NEEDED FOR PAIN (Patient taking differently: Apply 2 g topically 2 (two) times daily as needed (pain knee). )  . EPINEPHrine 0.3 mg/0.3 mL IJ SOAJ injection Inject 0.3 mLs (0.3 mg total) into the muscle as needed for anaphylaxis.  . hydrochlorothiazide (HYDRODIURIL) 25 MG tablet Take 1 tablet (25 mg total) by mouth daily.  . hydrOXYzine (VISTARIL) 25 MG capsule Take 1 capsule (25 mg total) by mouth every 6 (six) hours as needed for itching. TAKE ONE CAPSULE BY MOUTH EVERY 4 TO 6 HOURS AS NEEDED FOR  ITCHING  . meclizine (ANTIVERT) 25 MG tablet Take 1 tablet (25 mg total) by mouth 3 (three) times daily as needed for dizziness.  . Potassium 99 MG TABS Take 99 mg by mouth daily.  . sildenafil (REVATIO) 20 MG tablet TAKE 5 TABLETS BY MOUTH EVERY DAY AS NEEDED 2 HOURS BEFORE SEX (Patient taking differently: Take 100 mg by mouth as needed (ED). AS NEEDED 2 HOURS BEFORE SEX)      Depression screen [facility] 2/9 12/11/2019 08/22/2019 05/26/2017 08/20/2016 05/14/2016  Decreased Interest 2 0 0 1 0  Down, Depressed, Hopeless 0 0 0 1 0  PHQ - 2 Score 2 0 0 2 0  Altered sleeping 3 0 0 1 -  Tired, decreased energy 2 0 0 1 -  Change in appetite 3 0 0 0 -  Feeling bad or failure about yourself  0 0 0 0 -  Trouble concentrating 2 0 0 0 -  Moving slowly or fidgety/restless 0 0 0 0 -  Suicidal thoughts 0 0 0 0 -  PHQ-9 Score 12 0 0 4 -  Difficult doing work/chores - Not difficult at all Not difficult at all Not difficult at all -     Objective:   Today's Vitals: BP 140/82 (BP Location: Right Arm, Patient Position: Sitting, Cuff Size: Normal)   Pulse 89   Temp 97.6 F (36.4 C) (Temporal)   Resp 18   Ht 5\' 7"  (1.702 m)   Wt 245 lb (111.1 kg)   SpO2 96%   BMI 38.37 kg/m  Vitals with BMI 12/11/2019 11/06/2019 10/24/2019  Height 5\' 7"  5\' 7"  5\' 7"   Weight 245 lbs  224 lbs 10 oz 221 lbs  BMI 38.36 24.82 50.03  Systolic 704 888 916  Diastolic 82 68 71  Pulse 89 57 78     Physical Exam  He is morbidly obese.  Blood pressure is acceptable for his age.  He is alert and orientated.     Assessment   1. Essential hypertension, benign   2. Morbid obesity (  [place])   3. Prediabetes   4. Vitamin D deficiency disease   5. Erectile dysfunction, unspecified erectile dysfunction type   6. Special screening for malignant neoplasm of prostate       Tests ordered Orders Placed This Encounter  Procedures  . Testosterone Total,Free,Bio, Males  . PSA, Total with Reflex to PSA, Free     Plan: 1. He will continue with amlodipine and hydrochlorothiazide for his hypertension. 2. I will check testosterone and PSA levels. 3. I recommended he take vitamin D3 10,000 units daily based on previous vitamin D levels that were done in October. 4. We discussed nutrition very briefly and I introduced to him the concept of intermittent fasting and if he can fast for 16 hours every day, emphasizing the importance of hydration, I think you will start to see results in terms of weight loss. 5. Follow-up in January and we will discuss more his testosterone and PSA levels.  I spent 30 minutes with this patient discussing all his blood work and further recommendations.   No orders of the defined types were placed in this encounter.   Doree Albee, MD

## 2019-12-31 ENCOUNTER — Other Ambulatory Visit: Payer: Self-pay | Admitting: Family Medicine

## 2020-01-02 ENCOUNTER — Other Ambulatory Visit: Payer: Self-pay | Admitting: Family Medicine

## 2020-01-18 ENCOUNTER — Ambulatory Visit (INDEPENDENT_AMBULATORY_CARE_PROVIDER_SITE_OTHER): Payer: Medicare HMO | Admitting: Internal Medicine

## 2020-01-31 ENCOUNTER — Other Ambulatory Visit (INDEPENDENT_AMBULATORY_CARE_PROVIDER_SITE_OTHER): Payer: Self-pay

## 2020-01-31 MED ORDER — AMLODIPINE BESYLATE 5 MG PO TABS
5.0000 mg | ORAL_TABLET | Freq: Every day | ORAL | 0 refills | Status: DC
Start: 2020-01-31 — End: 2020-06-03

## 2020-02-08 ENCOUNTER — Other Ambulatory Visit (INDEPENDENT_AMBULATORY_CARE_PROVIDER_SITE_OTHER): Payer: Self-pay | Admitting: Internal Medicine

## 2020-02-08 ENCOUNTER — Telehealth (INDEPENDENT_AMBULATORY_CARE_PROVIDER_SITE_OTHER): Payer: Self-pay

## 2020-02-08 MED ORDER — HYDROCHLOROTHIAZIDE 25 MG PO TABS
25.0000 mg | ORAL_TABLET | Freq: Every day | ORAL | 0 refills | Status: DC
Start: 2020-02-08 — End: 2020-06-03

## 2020-02-08 NOTE — Telephone Encounter (Signed)
Called patient and scheduled him for a lab appointment on 02/12/2020 at [facility] and scheduled a follow up appointment with Sarah on 03/06/2020 at 10am. Patient verbalized an understanding.

## 2020-02-08 NOTE — Telephone Encounter (Signed)
Okay, I have sent a 90-day supply to [facility] in [place] but he does not have a follow-up appointment for some reason.  Please call and schedule him an appointment to follow-up with me.  Before he does that, there is blood work that he has not done so he needs to come in and do the blood work in the next week or so before the appointment which should be about a week later.  Thanks.

## 2020-02-08 NOTE — Telephone Encounter (Signed)
Received a refill request from [facility] for the following prescription:  hydrochlorothiazide (HYDRODIURIL) 25 MG tablet  Last filled 04/28/2019, # 90 with 1 refill by Dr. Mikey Kirschner, MD  Last OV 12/11/2019

## 2020-02-12 ENCOUNTER — Other Ambulatory Visit (INDEPENDENT_AMBULATORY_CARE_PROVIDER_SITE_OTHER): Payer: Medicare HMO

## 2020-02-12 ENCOUNTER — Other Ambulatory Visit: Payer: Self-pay

## 2020-02-12 DIAGNOSIS — Z125 Encounter for screening for malignant neoplasm of prostate: Secondary | ICD-10-CM | POA: Diagnosis not present

## 2020-02-12 DIAGNOSIS — N529 Male erectile dysfunction, unspecified: Secondary | ICD-10-CM | POA: Diagnosis not present

## 2020-02-13 LAB — TESTOSTERONE TOTAL,FREE,BIO, MALES
Albumin: 3.9 g/dL (ref 3.6–5.1)
Sex Hormone Binding: 80 nmol/L — ABNORMAL HIGH (ref 22–77)
Testosterone, Bioavailable: 59.7 ng/dL — ABNORMAL LOW (ref >=110.0)
Testosterone, Free: 33.3 pg/mL — ABNORMAL LOW (ref 46.0–224.0)
Testosterone: 537 ng/dL (ref 250–827)

## 2020-02-13 LAB — PSA, TOTAL WITH REFLEX TO PSA, FREE: PSA, Total: 0.5 ng/mL (ref <=4.0)

## 2020-02-21 ENCOUNTER — Other Ambulatory Visit (INDEPENDENT_AMBULATORY_CARE_PROVIDER_SITE_OTHER): Payer: Self-pay | Admitting: Internal Medicine

## 2020-03-06 ENCOUNTER — Ambulatory Visit (INDEPENDENT_AMBULATORY_CARE_PROVIDER_SITE_OTHER): Payer: Medicare HMO | Admitting: Nurse Practitioner

## 2020-03-13 ENCOUNTER — Ambulatory Visit (INDEPENDENT_AMBULATORY_CARE_PROVIDER_SITE_OTHER): Payer: Medicare HMO | Admitting: Internal Medicine

## 2020-04-02 ENCOUNTER — Other Ambulatory Visit: Payer: Self-pay

## 2020-04-02 ENCOUNTER — Encounter (INDEPENDENT_AMBULATORY_CARE_PROVIDER_SITE_OTHER): Payer: Self-pay | Admitting: Internal Medicine

## 2020-04-02 ENCOUNTER — Ambulatory Visit (INDEPENDENT_AMBULATORY_CARE_PROVIDER_SITE_OTHER): Payer: Medicare HMO | Admitting: Internal Medicine

## 2020-04-02 DIAGNOSIS — I10 Essential (primary) hypertension: Secondary | ICD-10-CM

## 2020-04-02 DIAGNOSIS — N529 Male erectile dysfunction, unspecified: Secondary | ICD-10-CM

## 2020-04-02 DIAGNOSIS — R7303 Prediabetes: Secondary | ICD-10-CM

## 2020-04-02 MED ORDER — TADALAFIL 20 MG PO TABS: 10.0000 mg | ORAL_TABLET | ORAL | 3 refills | Status: DC | PRN

## 2020-04-02 NOTE — Patient Instructions (Signed)
[facility] Dietary Recommendations for Weight Loss What to Avoid . Avoid added sugars o Often added sugar can be found in processed foods such as many condiments, dry cereals, cakes, cookies, chips, crisps, crackers, candies, sweetened drinks, etc.  o Read labels and AVOID/DECREASE use of foods with the following in their ingredient list: Sugar, fructose, high fructose corn syrup, sucrose, glucose, maltose, dextrose, molasses, cane sugar, brown sugar, any type of syrup, agave nectar, etc.   . Avoid snacking in between meals . Avoid foods made with flour o If you are going to eat food made with flour, choose those made with whole-grains; and, minimize your consumption as much as is tolerable . Avoid processed foods o These foods are generally stocked in the middle of the grocery store. Focus on shopping on the perimeter of the grocery.  . Avoid Meat  o We recommend following a plant-based diet at [facility]. Thus, we recommend avoiding meat as a general rule. Consider eating beans, legumes, eggs, and/or dairy products for regular protein sources o If you plan on eating meat limit to 4 ounces of meat at a time and choose lean options such as Fish, chicken, turkey. Avoid red meat intake such as pork and/or steak What to Include . Vegetables o GREEN LEAFY VEGETABLES: Kale, spinach, mustard greens, collard greens, cabbage, broccoli, etc. o OTHER: Asparagus, cauliflower, eggplant, carrots, peas, Brussel sprouts, tomatoes, bell peppers, zucchini, beets, cucumbers, etc. . Grains, seeds, and legumes o Beans: kidney beans, black eyed peas, garbanzo beans, black beans, pinto beans, etc. o Whole, unrefined grains: brown rice, barley, bulgur, oatmeal, etc. . Healthy fats  o Avoid highly processed fats such as vegetable oil o Examples of healthy fats: avocado, olives, virgin olive oil, dark chocolate (?72% Cocoa), nuts (peanuts, almonds, walnuts, cashews, pecans, etc.) . None to Low  Intake of Animal Sources of Protein o Meat sources: chicken, turkey, salmon, tuna. Limit to 4 ounces of meat at one time. o Consider limiting dairy sources, but when choosing dairy focus on: PLAIN Greek yogurt, cottage cheese, high-protein milk . Fruit o Choose berries  When to Eat . Intermittent Fasting: o Choosing not to eat for a specific time period, but DO FOCUS ON HYDRATION when fasting o Multiple Techniques: - Time Restricted Eating: eat 3 meals in a day, each meal lasting no more than 60 minutes, no snacks between meals - 16-18 hour fast: fast for 16 to 18 hours up to 7 days a week. Often suggested to start with 2-3 nonconsecutive days per week.  . Remember the time you sleep is counted as fasting.  . Examples of eating schedule: Fast from 7:00pm-11:00am. Eat between 11:00am-7:00pm.  - 24-hour fast: fast for 24 hours up to every other day. Often suggested to start with 1 day per week . Remember the time you sleep is counted as fasting . Examples of eating schedule:  o Eating day: eat 2-3 meals on your eating day. If doing 2 meals, each meal should last no more than 90 minutes. If doing 3 meals, each meal should last no more than 60 minutes. Finish last meal by 7:00pm. o Fasting day: Fast until 7:00pm.  o IF YOU FEEL UNWELL FOR ANY REASON/IN ANY WAY WHEN FASTING, STOP FASTING BY EATING A NUTRITIOUS SNACK OR LIGHT MEAL o ALWAYS FOCUS ON HYDRATION DURING FASTS - Acceptable Hydration sources: water, broths, tea/coffee (black tea/coffee is best but using a small amount of whole-fat dairy products in coffee/tea is acceptable).  -   Poor Hydration Sources: anything with sugar or artificial sweeteners added to it  These recommendations have been developed for patients that are actively receiving medical care from either Dr. [facility] or Sarah Gray, DNP, NP-C at [facility]. These recommendations are developed for patients with specific medical conditions and are not meant to be  distributed or used by others that are not actively receiving care from either provider listed above at [facility]. It is not appropriate to participate in the above eating plans without proper medical supervision.   Reference: Fung, J. The obesity code. [place]: [place]; 2016.   

## 2020-04-02 NOTE — Progress Notes (Signed)
Metrics: Intervention Frequency ACO  Documented Smoking Status Yearly  Screened one or more times in 24 months  Cessation Counseling or  Active cessation medication Past 24 months  Past 24 months   Guideline developer: UpToDate (See UpToDate for funding source) Date Released: 2014       Wellness Office Visit  Subjective:  Patient ID: JAK HAGGAR, male    DOB: 23-Nov-1950  Age: 70 y.o. MRN: [id number]  CC: This man comes in for follow-up of prediabetes, obesity, hypertension. HPI  Since last time I saw him, unfortunately, he has not really changed his nutrition.  We did check testosterone levels and his total testosterone was normal but his free testosterone was low.  He is focused on erectile dysfunction medications today.  He wonders if there is another option which would be more cost effective. In the past, he had diabetes and eventually had gastric bypass surgery which resulted in approximately 70 pound weight loss and cured his diabetes but the last hemoglobin A1c in October of last year showed that he was once again prediabetic.  Hemoglobin A1c was 6.1%. Past Medical History:  Diagnosis Date  . Anxiety   . Arthritis   . Back pain   . Depression   . Diabetes mellitus    due to weight loss, lost 70 lbs. due to gastric bypass   . Dysphagia   . Fatigue   . History of hiatal hernia   . Hypercholesteremia   . Hypertension   . Neuropathic pain   . Skin cancer of face   . Sleep apnea    Cpap  . Trauma 2010   multiple traumas- neck, back, ribs, collar bones, - resulted in prolonged hosp. & rehab stay , with trach   Past Surgical History:  Procedure Laterality Date  . ANTERIOR CERVICAL DECOMP/DISCECTOMY FUSION N/A 12/26/2018   Procedure: Anterior Cervical Decompression Fusion - Cervical Four - Cervical Five - Cervical Five - Cervical Six - Cervical Six - Cervical Seven;  Surgeon: Earnie Larsson, MD;  Location: [facility];  Service: Neurosurgery;  Laterality: N/A;  Anterior Cervical  Decompression Fusion - Cervical Four - Cervical Five - Cervical Five - Cervical Six - Cervical Six - Cervical Seven  . BREATH TEK H PYLORI N/A 03/01/2014   Procedure: BREATH TEK H PYLORI;  Surgeon: Alphonsa Overall, MD;  Location: [facility];  Service: General;  Laterality: N/A;  . COLONOSCOPY N/A 09/20/2013   Procedure: COLONOSCOPY;  Surgeon: Rogene Houston, MD;  Location: [facility];  Service: Endoscopy;  Laterality: N/A;  930  . DOG BITE    . LAPAROSCOPIC GASTRIC SLEEVE RESECTION WITH HIATAL HERNIA REPAIR N/A 03/19/2015   Procedure: LAPAROSCOPIC GASTRIC SLEEVE RESECTION WITH  HIATAL HERNIA REPAIR;  Surgeon: Alphonsa Overall, MD;  Location: [facility];  Service: General;  Laterality: N/A;  . TRACHEOSTOMY  2010  . UPPER GI ENDOSCOPY  03/19/2015   Procedure: UPPER GI ENDOSCOPY;  Surgeon: Alphonsa Overall, MD;  Location: [facility];  Service: General;;     Family History  Problem Relation Age of Onset  . Hypertension Father   . Hypertension Sister   . Hypertension Brother     Social History   Social History Narrative   Divorced since 2004,second marriage.Lives alone.Part time maintainence of rental properties.   Social History   Tobacco Use  . Smoking status: Former Smoker    Years: 40.00    Types: Cigarettes  . Smokeless tobacco: Never Used  . Tobacco comment: Using electric cigarettes  Substance Use Topics  . Alcohol use: Yes    Alcohol/week: 20.0 standard drinks    Types: 20 Cans of beer per week    Current Meds  Medication Sig  . amLODipine (NORVASC) 5 MG tablet Take 1 tablet (5 mg total) by mouth daily. Take one tablet po each morning  . citalopram (CELEXA) 40 MG tablet Take 1 tablet (40 mg total) by mouth daily.  . cyclobenzaprine (FLEXERIL) 10 MG tablet Take 1 tablet (10 mg total) by mouth 3 (three) times daily as needed for muscle spasms.  . diclofenac sodium (VOLTAREN) 1 % GEL APPLY TOPICALLY TWICE DAILY TO AFFECTED AREA AS NEEDED FOR PAIN (Patient taking differently: Apply 2 g  topically 2 (two) times daily as needed (pain knee).)  . EPINEPHrine 0.3 mg/0.3 mL IJ SOAJ injection Inject 0.3 mLs (0.3 mg total) into the muscle as needed for anaphylaxis.  . hydrochlorothiazide (HYDRODIURIL) 25 MG tablet Take 1 tablet (25 mg total) by mouth daily.  . hydrOXYzine (VISTARIL) 25 MG capsule Take 1 capsule (25 mg total) by mouth every 6 (six) hours as needed for itching. TAKE ONE CAPSULE BY MOUTH EVERY 4 TO 6 HOURS AS NEEDED FOR  ITCHING  . meclizine (ANTIVERT) 25 MG tablet TAKE 1 TABLET BY MOUTH THREE TIMES DAILY AS NEEDED FOR DIZZINESS  . Potassium 99 MG TABS Take 99 mg by mouth daily.  . sildenafil (REVATIO) 20 MG tablet TAKE 5 TABLETS BY MOUTH EVERY DAY AS NEEDED 2 HOURS BEFORE SEX (Patient taking differently: Take 100 mg by mouth as needed (ED). AS NEEDED 2 HOURS BEFORE SEX)  . tadalafil (CIALIS) 20 MG tablet Take 0.5-1 tablets (10-20 mg total) by mouth every other day as needed for erectile dysfunction.     [facility] Office Visit from 12/11/2019 in [facility]  PHQ-9 Total Score 12      Objective:   Today's Vitals: BP 128/69 (BP Location: Right Arm, Patient Position: Sitting, Cuff Size: Normal)   Pulse 76   Temp 98.4 F (36.9 C) (Temporal)   Resp 18   Ht 5\' 6"  (1.676 m)   Wt 222 lb (100.7 kg)   SpO2 97%   BMI 35.83 kg/m  Vitals with BMI 04/02/2020 12/11/2019 11/06/2019  Height 5\' 6"  5\' 7"  5\' 7"   Weight 222 lbs 245 lbs 224 lbs 10 oz  BMI 35.85 85.27 78.24  Systolic 235 361 443  Diastolic 69 82 68  Pulse 76 89 57     Physical Exam  He remains obese.  Blood pressure is good for his age.  He is alert and orientated without any focal neurological signs.     Assessment   1. Morbid obesity ([place])   2. Essential hypertension, benign   3. Prediabetes   4. Erectile dysfunction, unspecified erectile dysfunction type       Tests ordered No orders of the defined types were placed in this encounter.    Plan: 1. We discussed nutrition in  a bit more detail and the concept of intermittent fasting combined with a plant-based diet.  I stressed the importance of adequate hydration. 2. He will continue with amlodipine and hydrochlorthiazide for his hypertension. 3. I will try him on tadalafil for his erectile dysfunction.  Hopefully his insurance will pay for it. 4. Follow-up with Judson Roch in 3 months when all the blood work will need to be done.   Meds ordered this encounter  Medications  . tadalafil (CIALIS) 20 MG tablet    Sig: Take  0.5-1 tablets (10-20 mg total) by mouth every other day as needed for erectile dysfunction.    Dispense:  10 tablet    Refill:  3    Nimish Luther Parody, MD

## 2020-06-02 ENCOUNTER — Other Ambulatory Visit (INDEPENDENT_AMBULATORY_CARE_PROVIDER_SITE_OTHER): Payer: Self-pay | Admitting: Internal Medicine

## 2020-06-02 ENCOUNTER — Other Ambulatory Visit (INDEPENDENT_AMBULATORY_CARE_PROVIDER_SITE_OTHER): Payer: Self-pay | Admitting: Nurse Practitioner

## 2020-07-03 ENCOUNTER — Other Ambulatory Visit: Payer: Self-pay

## 2020-07-03 ENCOUNTER — Ambulatory Visit (INDEPENDENT_AMBULATORY_CARE_PROVIDER_SITE_OTHER): Payer: Medicare HMO | Admitting: Nurse Practitioner

## 2020-07-03 ENCOUNTER — Encounter (INDEPENDENT_AMBULATORY_CARE_PROVIDER_SITE_OTHER): Payer: Self-pay | Admitting: Nurse Practitioner

## 2020-07-03 VITALS — BP 152/66 | HR 40 | Temp 96.5°F | Ht 66.0 in | Wt 224.2 lb

## 2020-07-03 DIAGNOSIS — Z87892 Personal history of anaphylaxis: Secondary | ICD-10-CM

## 2020-07-03 DIAGNOSIS — E559 Vitamin D deficiency, unspecified: Secondary | ICD-10-CM | POA: Diagnosis not present

## 2020-07-03 DIAGNOSIS — G4730 Sleep apnea, unspecified: Secondary | ICD-10-CM | POA: Diagnosis not present

## 2020-07-03 DIAGNOSIS — I491 Atrial premature depolarization: Secondary | ICD-10-CM

## 2020-07-03 DIAGNOSIS — I10 Essential (primary) hypertension: Secondary | ICD-10-CM | POA: Diagnosis not present

## 2020-07-03 DIAGNOSIS — G8929 Other chronic pain: Secondary | ICD-10-CM | POA: Diagnosis not present

## 2020-07-03 DIAGNOSIS — R001 Bradycardia, unspecified: Secondary | ICD-10-CM | POA: Diagnosis not present

## 2020-07-03 DIAGNOSIS — Z8639 Personal history of other endocrine, nutritional and metabolic disease: Secondary | ICD-10-CM

## 2020-07-03 DIAGNOSIS — M5440 Lumbago with sciatica, unspecified side: Secondary | ICD-10-CM | POA: Diagnosis not present

## 2020-07-03 MED ORDER — CYCLOBENZAPRINE HCL 10 MG PO TABS: 10.0000 mg | ORAL_TABLET | Freq: Three times a day (TID) | ORAL | 0 refills | Status: DC | PRN

## 2020-07-03 MED ORDER — EPINEPHRINE 0.3 MG/0.3ML IJ SOAJ: 0.3000 mg | INTRAMUSCULAR | 0 refills | Status: AC | PRN

## 2020-07-03 NOTE — Progress Notes (Signed)
Subjective:  Patient ID: Dennis Mitchell, male    DOB: 15-Apr-1950  Age: 70 y.o. MRN: [id number]  CC:  Chief Complaint  Patient presents with   Follow-up    Doing okay, needs a refill of Cyclobenzaprine 10 mg to have in case he needs it sent to [facility] in [place], needs a new Epipen   Hypertension   Diabetes   Other    Vitamin D deficiency   Fatigue      HPI  This patient arrives today for the above.  Hypertension: He continues on amlodipine and hydrochlorothiazide.  He used to check his blood pressure at home but does not do regularly anymore.  He does have an at home blood pressure cuff that he could utilize.  History of diabetes: He has a history of diabetes which is now well controlled with lifestyle.  Last A1c was 6.1.  He is due to have A1c rechecked today.  Vitamin D deficiency: He was told to start vitamin D3 supplementation.  Last serum check was collected about 6 months ago and was 26.  He tells me he is taking a supplement he is not exactly sure of the dose but thinks it is 5000 IUs daily.  Fatigue: He does report fatigue.  He denies any significant depressed mood.  We have ruled out anemia as well as significant kidney or liver dysfunction.  As stated above A1c is well controlled.  He does have a history of obstructive sleep apnea but he tells me he has not used his CPAP machine about 5 to 6 years.  He tells me no one ever told him to stop taking he to stop taking on his own.  He tells me he used to wake himself up snoring before losing weight, but no longer does this.  Past Medical History:  Diagnosis Date   Anxiety    Arthritis    Back pain    Depression    Diabetes mellitus    due to weight loss, lost 70 lbs. due to gastric bypass    Dysphagia    Fatigue    History of hiatal hernia    Hypercholesteremia    Hypertension    Neuropathic pain    Skin cancer of face    Sleep apnea    Cpap   Trauma 2010   multiple traumas- neck, back, ribs, collar  bones, - resulted in prolonged hosp. & rehab stay , with trach      Family History  Problem Relation Age of Onset   Hypertension Father    Hypertension Sister    Hypertension Brother     Social History   Social History Narrative   Divorced since 2004,second marriage.Lives alone.Part time maintainence of rental properties.   Social History   Tobacco Use   Smoking status: Former    Years: 40.00    Pack years: 0.00    Types: Cigarettes   Smokeless tobacco: Never   Tobacco comments:    Using electric cigarettes  Substance Use Topics   Alcohol use: Yes    Alcohol/week: 20.0 standard drinks    Types: 20 Cans of beer per week     Current Meds  Medication Sig   Acetaminophen (TYLENOL ARTHRITIS PAIN PO) Take by mouth. Takes as needed   amLODipine (NORVASC) 5 MG tablet TAKE 1 TABLET BY MOUTH ONCE DAILY IN THE MORNING   Cholecalciferol 125 MCG (5000 UT) TABS Take 1 tablet by mouth daily.   citalopram (CELEXA) 40  MG tablet Take 1 tablet (40 mg total) by mouth daily.   diclofenac sodium (VOLTAREN) 1 % GEL APPLY TOPICALLY TWICE DAILY TO AFFECTED AREA AS NEEDED FOR PAIN (Patient taking differently: Apply 2 g topically 2 (two) times daily as needed (pain knee).)   hydrochlorothiazide (HYDRODIURIL) 25 MG tablet Take 1 tablet by mouth once daily   hydrOXYzine (VISTARIL) 25 MG capsule Take 1 capsule (25 mg total) by mouth every 6 (six) hours as needed for itching. TAKE ONE CAPSULE BY MOUTH EVERY 4 TO 6 HOURS AS NEEDED FOR  ITCHING   meclizine (ANTIVERT) 25 MG tablet TAKE 1 TABLET BY MOUTH THREE TIMES DAILY AS NEEDED FOR DIZZINESS   MELATONIN PO Take by mouth. Takes a gummy at night   Potassium 99 MG TABS Take 99 mg by mouth daily.   tadalafil (CIALIS) 20 MG tablet Take 0.5-1 tablets (10-20 mg total) by mouth every other day as needed for erectile dysfunction.   [DISCONTINUED] cyclobenzaprine (FLEXERIL) 10 MG tablet Take 1 tablet (10 mg total) by mouth 3 (three) times daily as needed for  muscle spasms.   [DISCONTINUED] EPINEPHrine 0.3 mg/0.3 mL IJ SOAJ injection Inject 0.3 mLs (0.3 mg total) into the muscle as needed for anaphylaxis.    ROS:  Review of Systems  Constitutional:  Positive for malaise/fatigue.  Respiratory:  Negative for shortness of breath.   Cardiovascular:  Negative for chest pain and palpitations.  Gastrointestinal:  Negative for abdominal pain, blood in stool and melena.    Objective:   Today's Vitals: BP (!) 152/66   Pulse (!) 40   Temp (!) 96.5 F (35.8 C) (Temporal)   Ht 5' 6"  (1.676 m)   Wt 224 lb 3.2 oz (101.7 kg)   SpO2 98%   BMI 36.19 kg/m  Vitals with BMI 07/03/2020 04/02/2020 12/11/2019  Height 5' 6"  5' 6"  5' 7"   Weight 224 lbs 3 oz 222 lbs 245 lbs  BMI 36.2 62.26 33.35  Systolic 456 256 389  Diastolic 66 69 82  Pulse 40 76 89     Physical Exam Vitals reviewed.  Constitutional:      Appearance: Normal appearance.  HENT:     Head: Normocephalic and atraumatic.  Cardiovascular:     Rate and Rhythm: Normal rate. Rhythm irregular.  Pulmonary:     Effort: Pulmonary effort is normal.     Breath sounds: Normal breath sounds.  Musculoskeletal:     Cervical back: Neck supple.  Skin:    General: Skin is warm and dry.  Neurological:     Mental Status: He is alert and oriented to person, place, and time.  Psychiatric:        Mood and Affect: Mood normal.        Behavior: Behavior normal.        Thought Content: Thought content normal.        Judgment: Judgment normal.     EKG: Heart rate of 71, sinus rhythm with frequent PACs (atrial bigeminy)    Assessment and Plan   1. Bradycardia   2. Sleep apnea, unspecified type   3. Vitamin D deficiency disease   4. History of diabetes mellitus, type II   5. Chronic low back pain with sciatica, sciatica laterality unspecified, unspecified back pain laterality   6. History of anaphylaxis   7. PAC (premature atrial contraction)   8. Essential hypertension, benign       Plan: 1.,  7.  Upon rooming his heart rate was only registering  at 40 on the pulse oximeter.  EKG does show heart rate of 71 and atrial bigeminy.  I did review the EKG with Dr. Anastasio Champion and patient does have a history of PACs in the past.  Patient is asymptomatic.  No further work-up or recommendations made today, but we will continue to monitor this.  I did tell the patient if he starts to experience any signs of chest pain, shortness of breath, or leg swelling to notify us and he expresses understanding. 2.  Wondering if his sleep apnea may be related to his fatigue.  I am going to refer him to neurology so he can undergo a new sleep study for further evaluation. 3.  We will check serum vitamin D level today. 4.  We will check A1c as well as check urine for albuminuria. 5.  He is requesting for refill of his Flexeril so I will refer this today. 6.  He is requesting refill for his EpiPen I will refill this today. 8.  Blood pressure is above goal today but has been better in previous visits.  I have recommended he check his blood pressure couple times a week at home on his at home cuff and bring the log of these readings to the office in 6 weeks for close monitoring of blood pressure.  May consider increasing dose of amlodipine if blood pressure remains above goal.   Tests ordered Orders Placed This Encounter  Procedures   Hemoglobin A1c   Microalbumin/Creatinine Ratio, Urine   Vitamin D, 25-hydroxy   CMP with eGFR([facility])   Ambulatory referral to Neurology   EKG 12-Lead      Meds ordered this encounter  Medications   cyclobenzaprine (FLEXERIL) 10 MG tablet    Sig: Take 1 tablet (10 mg total) by mouth 3 (three) times daily as needed for muscle spasms.    Dispense:  30 tablet    Refill:  0    Order Specific Question:   Supervising Provider    Answer:   Doree Albee [[id number]]   EPINEPHrine 0.3 mg/0.3 mL IJ SOAJ injection    Sig: Inject 0.3 mg into the muscle as needed for  anaphylaxis.    Dispense:  1 each    Refill:  0    Order Specific Question:   Supervising Provider    Answer:   Doree Albee [[id number]]     Patient to follow-up in 6 weeks or sooner as needed.  Ailene Ards, NP

## 2020-07-04 ENCOUNTER — Other Ambulatory Visit (INDEPENDENT_AMBULATORY_CARE_PROVIDER_SITE_OTHER): Payer: Self-pay | Admitting: Nurse Practitioner

## 2020-07-04 DIAGNOSIS — R809 Proteinuria, unspecified: Secondary | ICD-10-CM

## 2020-07-04 LAB — COMPLETE METABOLIC PANEL WITH GFR
AG Ratio: 1.3 (calc) (ref 1.0–2.5)
ALT: 17 U/L (ref 9–46)
AST: 23 U/L (ref 10–35)
Albumin: 3.8 g/dL (ref 3.6–5.1)
Alkaline phosphatase (APISO): 66 U/L (ref 35–144)
BUN: 19 mg/dL (ref 7–25)
CO2: 30 mmol/L (ref 20–32)
Calcium: 9 mg/dL (ref 8.6–10.3)
Chloride: 96 mmol/L — ABNORMAL LOW (ref 98–110)
Creat: 1 mg/dL (ref 0.70–1.18)
GFR, Est African American: 88 mL/min/{1.73_m2} (ref >=60)
GFR, Est Non African American: 76 mL/min/{1.73_m2} (ref >=60)
Globulin: 3 g/dL (calc) (ref 1.9–3.7)
Glucose, Bld: 100 mg/dL (ref 65–139)
Potassium: 3.6 mmol/L (ref 3.5–5.3)
Sodium: 134 mmol/L — ABNORMAL LOW (ref 135–146)
Total Bilirubin: 0.5 mg/dL (ref 0.2–1.2)
Total Protein: 6.8 g/dL (ref 6.1–8.1)

## 2020-07-04 LAB — MICROALBUMIN / CREATININE URINE RATIO
Creatinine, Urine: 32 mg/dL (ref 20–320)
Microalb Creat Ratio: 516 mcg/mg creat — ABNORMAL HIGH (ref <30)
Microalb, Ur: 16.5 mg/dL

## 2020-07-04 LAB — HEMOGLOBIN A1C
Hgb A1c MFr Bld: 5.8 % of total Hgb — ABNORMAL HIGH (ref <5.7)
Mean Plasma Glucose: 120 mg/dL
eAG (mmol/L): 6.6 mmol/L

## 2020-07-04 LAB — VITAMIN D 25 HYDROXY (VIT D DEFICIENCY, FRACTURES): Vit D, 25-Hydroxy: 40 ng/mL (ref 30–100)

## 2020-07-31 ENCOUNTER — Telehealth (INDEPENDENT_AMBULATORY_CARE_PROVIDER_SITE_OTHER): Payer: Self-pay

## 2020-07-31 DIAGNOSIS — T1502XA Foreign body in cornea, left eye, initial encounter: Secondary | ICD-10-CM | POA: Diagnosis not present

## 2020-07-31 NOTE — Telephone Encounter (Signed)
Received a 90 day refill request from [facility] for the following medication:  citalopram (CELEXA) 40 MG tablet  Last filled 10/05/2019, #60 with 0 refills  Last OV 07/03/2020  Next OV 08/14/2020

## 2020-08-01 ENCOUNTER — Other Ambulatory Visit (INDEPENDENT_AMBULATORY_CARE_PROVIDER_SITE_OTHER): Payer: Self-pay | Admitting: Internal Medicine

## 2020-08-01 MED ORDER — CITALOPRAM HYDROBROMIDE 40 MG PO TABS: 40.0000 mg | ORAL_TABLET | Freq: Every day | ORAL | 0 refills | Status: DC

## 2020-08-14 ENCOUNTER — Ambulatory Visit (INDEPENDENT_AMBULATORY_CARE_PROVIDER_SITE_OTHER): Payer: Medicare HMO | Admitting: Nurse Practitioner

## 2020-08-28 ENCOUNTER — Telehealth (INDEPENDENT_AMBULATORY_CARE_PROVIDER_SITE_OTHER): Payer: Self-pay

## 2020-08-28 DIAGNOSIS — F331 Major depressive disorder, recurrent, moderate: Secondary | ICD-10-CM

## 2020-08-28 DIAGNOSIS — G8929 Other chronic pain: Secondary | ICD-10-CM

## 2020-08-28 DIAGNOSIS — H3562 Retinal hemorrhage, left eye: Secondary | ICD-10-CM | POA: Diagnosis not present

## 2020-08-28 DIAGNOSIS — I10 Essential (primary) hypertension: Secondary | ICD-10-CM

## 2020-08-28 DIAGNOSIS — H35033 Hypertensive retinopathy, bilateral: Secondary | ICD-10-CM | POA: Diagnosis not present

## 2020-08-28 DIAGNOSIS — M5440 Lumbago with sciatica, unspecified side: Secondary | ICD-10-CM

## 2020-08-28 DIAGNOSIS — H25813 Combined forms of age-related cataract, bilateral: Secondary | ICD-10-CM | POA: Diagnosis not present

## 2020-08-28 DIAGNOSIS — N529 Male erectile dysfunction, unspecified: Secondary | ICD-10-CM

## 2020-08-28 DIAGNOSIS — H353122 Nonexudative age-related macular degeneration, left eye, intermediate dry stage: Secondary | ICD-10-CM | POA: Diagnosis not present

## 2020-08-28 DIAGNOSIS — I700 Atherosclerosis of aorta: Secondary | ICD-10-CM | POA: Diagnosis not present

## 2020-08-28 DIAGNOSIS — R42 Dizziness and giddiness: Secondary | ICD-10-CM

## 2020-08-28 DIAGNOSIS — H353211 Exudative age-related macular degeneration, right eye, with active choroidal neovascularization: Secondary | ICD-10-CM | POA: Diagnosis not present

## 2020-08-28 DIAGNOSIS — L299 Pruritus, unspecified: Secondary | ICD-10-CM

## 2020-08-28 NOTE — Telephone Encounter (Signed)
Patient came into the office today and requested a refill on all of his medications to be sent to [facility] in [place]. He has been reaching out to new providers and all have let him know they are full and I gave him some other offices to try. Can you send in refills?

## 2020-08-29 MED ORDER — HYDROXYZINE PAMOATE 25 MG PO CAPS: 25.0000 mg | ORAL_CAPSULE | Freq: Three times a day (TID) | ORAL | 2 refills | Status: AC | PRN

## 2020-08-29 MED ORDER — CYCLOBENZAPRINE HCL 10 MG PO TABS: 10.0000 mg | ORAL_TABLET | Freq: Three times a day (TID) | ORAL | 2 refills | Status: AC | PRN

## 2020-08-29 MED ORDER — CITALOPRAM HYDROBROMIDE 40 MG PO TABS: 40.0000 mg | ORAL_TABLET | Freq: Every day | ORAL | 0 refills | Status: AC

## 2020-08-29 MED ORDER — HYDROCHLOROTHIAZIDE 25 MG PO TABS
25.0000 mg | ORAL_TABLET | Freq: Every day | ORAL | 0 refills | Status: DC
Start: 2020-08-29 — End: 2022-06-28

## 2020-08-29 MED ORDER — MECLIZINE HCL 25 MG PO TABS: 25.0000 mg | ORAL_TABLET | Freq: Three times a day (TID) | ORAL | 1 refills | Status: AC | PRN

## 2020-08-29 MED ORDER — AMLODIPINE BESYLATE 5 MG PO TABS
5.0000 mg | ORAL_TABLET | Freq: Every morning | ORAL | 0 refills | Status: DC
Start: 2020-08-29 — End: 2022-06-28

## 2020-08-29 MED ORDER — TADALAFIL 20 MG PO TABS: 10.0000 mg | ORAL_TABLET | ORAL | 3 refills | Status: DC | PRN

## 2020-08-29 NOTE — Telephone Encounter (Signed)
Called patient and he stated that he does need a refill on the following medications: hydroxyzine and meclizine and tadalafil and he stated that he is okay on everything else, even the epi. Patient verbalized an understanding and thanked [place]. Please send in those refills also.

## 2020-08-29 NOTE — Telephone Encounter (Signed)
Additional refills approved and sent to pharmacy

## 2020-08-29 NOTE — Telephone Encounter (Signed)
I will approve refill for his amlodipine, citalopram, cyclobenzaprine, and hydrochlorothiazide.  I do see some as needed medications that do not appear to have been filled in quite some time.  These include hydroxyzine and meclizine.  Please let me know if the patient needs a refill on these.  In addition he has both sildenafil and tadalafil on his medication list that he can take as needed for erectile dysfunction.  I would prefer to only fill 1 of these medication as opposed to both and please call him and ask him which when he would prefer.  Lastly, I see he has epinephrine on his list and this was ordered about 2 months ago, please let me know if he needs an additional refill of his epinephrine.  Thank you.

## 2020-08-29 NOTE — Addendum Note (Signed)
Addended by: Ailene Ards on: 08/29/2020 03:57 PM   Modules accepted: Orders

## 2020-09-19 ENCOUNTER — Institutional Professional Consult (permissible substitution): Payer: Medicare HMO | Admitting: Neurology

## 2020-09-19 ENCOUNTER — Telehealth: Payer: Self-pay | Admitting: Neurology

## 2020-09-19 NOTE — Telephone Encounter (Signed)
Noted  

## 2020-09-19 NOTE — Telephone Encounter (Signed)
Pt cancelled appt due not being able to find the office. Pt requested to reschedule appt.  Attempted to guide pt to the office. He was frustrated and could not get his bearings.

## 2020-09-20 ENCOUNTER — Telehealth: Payer: Self-pay | Admitting: Neurology

## 2020-09-20 NOTE — Telephone Encounter (Signed)
Called pt lm to inform provider will be out of the office on 09/23/2020.

## 2020-09-23 ENCOUNTER — Institutional Professional Consult (permissible substitution): Payer: Medicare HMO | Admitting: Neurology

## 2020-09-27 DIAGNOSIS — H35031 Hypertensive retinopathy, right eye: Secondary | ICD-10-CM | POA: Diagnosis not present

## 2020-09-27 DIAGNOSIS — H25813 Combined forms of age-related cataract, bilateral: Secondary | ICD-10-CM | POA: Diagnosis not present

## 2020-09-27 DIAGNOSIS — H353211 Exudative age-related macular degeneration, right eye, with active choroidal neovascularization: Secondary | ICD-10-CM | POA: Diagnosis not present

## 2020-09-27 DIAGNOSIS — H353122 Nonexudative age-related macular degeneration, left eye, intermediate dry stage: Secondary | ICD-10-CM | POA: Diagnosis not present

## 2020-09-30 DIAGNOSIS — Z1389 Encounter for screening for other disorder: Secondary | ICD-10-CM | POA: Diagnosis not present

## 2020-09-30 DIAGNOSIS — R319 Hematuria, unspecified: Secondary | ICD-10-CM | POA: Diagnosis not present

## 2020-10-07 DIAGNOSIS — R319 Hematuria, unspecified: Secondary | ICD-10-CM | POA: Diagnosis not present

## 2020-10-18 DIAGNOSIS — H25813 Combined forms of age-related cataract, bilateral: Secondary | ICD-10-CM | POA: Diagnosis not present

## 2020-10-18 DIAGNOSIS — Z01818 Encounter for other preprocedural examination: Secondary | ICD-10-CM | POA: Diagnosis not present

## 2020-10-18 DIAGNOSIS — H25811 Combined forms of age-related cataract, right eye: Secondary | ICD-10-CM | POA: Diagnosis not present

## 2020-10-18 DIAGNOSIS — H25812 Combined forms of age-related cataract, left eye: Secondary | ICD-10-CM | POA: Diagnosis not present

## 2020-11-04 DIAGNOSIS — G4733 Obstructive sleep apnea (adult) (pediatric): Secondary | ICD-10-CM | POA: Insufficient documentation

## 2020-11-04 DIAGNOSIS — H3530 Unspecified macular degeneration: Secondary | ICD-10-CM | POA: Diagnosis not present

## 2020-11-04 DIAGNOSIS — E119 Type 2 diabetes mellitus without complications: Secondary | ICD-10-CM | POA: Diagnosis not present

## 2020-11-04 DIAGNOSIS — F32A Depression, unspecified: Secondary | ICD-10-CM | POA: Diagnosis not present

## 2020-11-04 DIAGNOSIS — F5221 Male erectile disorder: Secondary | ICD-10-CM | POA: Diagnosis not present

## 2020-11-04 DIAGNOSIS — Z01810 Encounter for preprocedural cardiovascular examination: Secondary | ICD-10-CM | POA: Diagnosis not present

## 2020-11-04 DIAGNOSIS — I10 Essential (primary) hypertension: Secondary | ICD-10-CM | POA: Diagnosis not present

## 2020-11-04 DIAGNOSIS — E559 Vitamin D deficiency, unspecified: Secondary | ICD-10-CM | POA: Diagnosis not present

## 2020-11-04 DIAGNOSIS — R42 Dizziness and giddiness: Secondary | ICD-10-CM | POA: Diagnosis not present

## 2020-11-04 DIAGNOSIS — Z23 Encounter for immunization: Secondary | ICD-10-CM | POA: Diagnosis not present

## 2020-11-06 DIAGNOSIS — H25811 Combined forms of age-related cataract, right eye: Secondary | ICD-10-CM | POA: Diagnosis not present

## 2020-11-15 DIAGNOSIS — E119 Type 2 diabetes mellitus without complications: Secondary | ICD-10-CM | POA: Diagnosis not present

## 2020-11-15 DIAGNOSIS — I10 Essential (primary) hypertension: Secondary | ICD-10-CM | POA: Diagnosis not present

## 2020-11-15 DIAGNOSIS — E559 Vitamin D deficiency, unspecified: Secondary | ICD-10-CM | POA: Diagnosis not present

## 2020-11-26 DIAGNOSIS — E7800 Pure hypercholesterolemia, unspecified: Secondary | ICD-10-CM | POA: Diagnosis not present

## 2020-11-26 DIAGNOSIS — E559 Vitamin D deficiency, unspecified: Secondary | ICD-10-CM | POA: Diagnosis not present

## 2020-11-26 DIAGNOSIS — I10 Essential (primary) hypertension: Secondary | ICD-10-CM | POA: Diagnosis not present

## 2020-11-26 DIAGNOSIS — E119 Type 2 diabetes mellitus without complications: Secondary | ICD-10-CM | POA: Diagnosis not present

## 2020-11-26 DIAGNOSIS — E8809 Other disorders of plasma-protein metabolism, not elsewhere classified: Secondary | ICD-10-CM | POA: Diagnosis not present

## 2020-11-27 DIAGNOSIS — H25812 Combined forms of age-related cataract, left eye: Secondary | ICD-10-CM | POA: Diagnosis not present

## 2020-11-28 ENCOUNTER — Other Ambulatory Visit (INDEPENDENT_AMBULATORY_CARE_PROVIDER_SITE_OTHER): Payer: Self-pay | Admitting: Nurse Practitioner

## 2020-11-28 ENCOUNTER — Ambulatory Visit: Payer: Medicare HMO | Admitting: Neurology

## 2020-11-28 ENCOUNTER — Encounter: Payer: Self-pay | Admitting: Neurology

## 2020-11-28 VITALS — BP 168/84 | HR 75 | Ht 66.0 in | Wt 220.8 lb

## 2020-11-28 DIAGNOSIS — R600 Localized edema: Secondary | ICD-10-CM

## 2020-11-28 DIAGNOSIS — G4733 Obstructive sleep apnea (adult) (pediatric): Secondary | ICD-10-CM | POA: Diagnosis not present

## 2020-11-28 DIAGNOSIS — R351 Nocturia: Secondary | ICD-10-CM | POA: Diagnosis not present

## 2020-11-28 DIAGNOSIS — I499 Cardiac arrhythmia, unspecified: Secondary | ICD-10-CM | POA: Diagnosis not present

## 2020-11-28 DIAGNOSIS — R5383 Other fatigue: Secondary | ICD-10-CM

## 2020-11-28 DIAGNOSIS — E669 Obesity, unspecified: Secondary | ICD-10-CM

## 2020-11-28 DIAGNOSIS — R030 Elevated blood-pressure reading, without diagnosis of hypertension: Secondary | ICD-10-CM | POA: Diagnosis not present

## 2020-11-28 DIAGNOSIS — R011 Cardiac murmur, unspecified: Secondary | ICD-10-CM

## 2020-11-28 DIAGNOSIS — I10 Essential (primary) hypertension: Secondary | ICD-10-CM

## 2020-11-28 NOTE — Patient Instructions (Signed)

## 2020-11-28 NOTE — Progress Notes (Signed)
Subjective:    Patient ID: Dennis Mitchell is a 70 y.o. male.  HPI    Star Age, MD, PhD [facility] [address] [place], [place] [address]  I saw patient, Dennis Mitchell, at the request of his previous PCP, in my sleep clinic today for initial consultation of his sleep disorder, in particular, evaluation of his prior diagnosis of obstructive sleep apnea.  The patient is unaccompanied today. Dennis Mitchell is a 70 year old right-handed gentleman with an underlying medical history of arthritis, anxiety, back pain, diabetes, bradycardia, PACs, vitamin D deficiency, hypertension, hyperlipidemia, neuropathy, history of multiple injuries in 2010 with s/p surgeries including neck surgery, history of tracheostomy, and obesity with status post bariatric surgery (gastric sleeve), who was previously diagnosed with obstructive sleep apnea and placed on CPAP therapy.  Prior sleep study results are not available for my review today, testing was approximately 12 years ago per patient.  He lost about 70 pounds after his bariatric surgery some 4 to 5 years ago.  He has had some weight gain since then.  He no longer has a CPAP machine.  He has not used his machine in years.  He was recently noted to have bradycardia.  His Epworth sleepiness score is 7 out of 24, fatigue severity score is 17 out of 63.  He is separated, he lives alone, 1 dog and 1 cat in the household.  He has trouble sleeping in his bed since his neck surgery and sleeps in a recliner in his family room, he has a TV on at night typically.  He has nocturia about once per average night and denies recurrent morning headaches. He is not aware of any family history of sleep apnea.  He would be willing to get retested and consider positive airway pressure treatment again.  He smokes cigarettes occasionally, uses nicotine vapor.  He drinks alcohol in the form of beer, up to 2/day, he drinks decaf coffee, several cups  per day.  He is a retired Clinical biochemist.  His new primary care physician is Dr. Wende Neighbors.  His Past Medical History Is Significant For: Past Medical History:  Diagnosis Date   Anxiety    Arthritis    Back pain    Depression    Diabetes mellitus    due to weight loss, lost 70 lbs. due to gastric bypass    Dysphagia    Fatigue    History of hiatal hernia    Hypercholesteremia    Hypertension    Neuropathic pain    Skin cancer of face    Sleep apnea    Cpap   Trauma 2010   multiple traumas- neck, back, ribs, collar bones, - resulted in prolonged hosp. & rehab stay , with trach    His Past Surgical History Is Significant For: Past Surgical History:  Procedure Laterality Date   ANTERIOR CERVICAL DECOMP/DISCECTOMY FUSION N/A 12/26/2018   Procedure: Anterior Cervical Decompression Fusion - Cervical Four - Cervical Five - Cervical Five - Cervical Six - Cervical Six - Cervical Seven;  Surgeon: Earnie Larsson, MD;  Location: [facility];  Service: Neurosurgery;  Laterality: N/A;  Anterior Cervical Decompression Fusion - Cervical Four - Cervical Five - Cervical Five - Cervical Six - Cervical Six - Cervical Seven   BREATH TEK H PYLORI N/A 03/01/2014   Procedure: BREATH TEK H PYLORI;  Surgeon: Alphonsa Overall, MD;  Location: [facility];  Service: General;  Laterality: N/A;   COLONOSCOPY N/A 09/20/2013  Procedure: COLONOSCOPY;  Surgeon: Rogene Houston, MD;  [facility];  Service: Endoscopy;  Laterality: N/A;  [place] GASTRIC SLEEVE RESECTION WITH HIATAL HERNIA REPAIR N/A 03/19/2015   Procedure: LAPAROSCOPIC GASTRIC SLEEVE RESECTION WITH  HIATAL HERNIA REPAIR;  Surgeon: Alphonsa Overall, MD;  Location: [facility];  Service: General;  Laterality: N/A;   TRACHEOSTOMY  2010   UPPER GI ENDOSCOPY  03/19/2015   Procedure: UPPER GI ENDOSCOPY;  Surgeon: Alphonsa Overall, MD;  Location: [facility];  Service: General;;    His Family History Is Significant For: Family History  Problem Relation  Age of Onset   Hypertension Father    Hypertension Sister    Hypertension Brother    Sleep apnea Neg Hx     His Social History Is Significant For: Social History   Socioeconomic History   Marital status: Single    Spouse name: Not on file   Number of children: Not on file   Years of education: Not on file   Highest education level: Not on file  Occupational History   Not on file  Tobacco Use   Smoking status: Some Days    Years: 40.00    Types: E-cigarettes, Cigarettes   Smokeless tobacco: Never   Tobacco comments:    Using electric cigarettes  Vaping Use   Vaping Use: Every day   Substances: Nicotine  Substance and Sexual Activity   Alcohol use: Yes    Alcohol/week: 8.0 standard drinks    Types: 8 Cans of beer per week   Drug use: No   Sexual activity: Not on file  Other Topics Concern   Not on file  Social History Narrative   Divorced since 70,second marriage.Lives alone.Part time maintainence of rental properties.   Social Determinants of Health   Financial Resource Strain: Not on file  Food Insecurity: Not on file  Transportation Needs: Not on file  Physical Activity: Not on file  Stress: Not on file  Social Connections: Not on file    His Allergies Are:  Allergies  Allergen Reactions   Bee Venom Shortness Of Breath   Aspirin Other (See Comments)    Lip swelling   Lisinopril Other (See Comments)    unknown   Penicillins Hives    .Marland KitchenHas patient had a PCN reaction causing immediate rash, facial/tongue/throat swelling, SOB or lightheadedness with hypotension: Yes -Lips Has patient had a PCN reaction causing severe rash involving mucus membranes or skin necrosis: No Has patient had a PCN reaction that required hospitalization No Has patient had a PCN reaction occurring within the last 10 years: No If all of the above answers are "NO", then may proceed with Cephalosporin use.    :   His Current Medications Are:  Outpatient Encounter Medications as of  11/28/2020  Medication Sig   Acetaminophen (TYLENOL ARTHRITIS PAIN PO) Take by mouth. Takes as needed   amLODipine (NORVASC) 5 MG tablet Take 1 tablet (5 mg total) by mouth every morning.   Cholecalciferol 125 MCG (5000 UT) TABS Take 1 tablet by mouth daily.   citalopram (CELEXA) 40 MG tablet Take 1 tablet (40 mg total) by mouth daily.   cyclobenzaprine (FLEXERIL) 10 MG tablet Take 1 tablet (10 mg total) by mouth 3 (three) times daily as needed for muscle spasms.   diclofenac sodium (VOLTAREN) 1 % GEL APPLY TOPICALLY TWICE DAILY TO AFFECTED AREA AS NEEDED FOR PAIN (Patient taking differently: Apply 2 g topically 2 (  two) times daily as needed (pain knee).)   EPINEPHrine 0.3 mg/0.3 mL IJ SOAJ injection Inject 0.3 mg into the muscle as needed for anaphylaxis.   hydrochlorothiazide (HYDRODIURIL) 25 MG tablet Take 1 tablet (25 mg total) by mouth daily.   hydrOXYzine (VISTARIL) 25 MG capsule Take 1 capsule (25 mg total) by mouth every 8 (eight) hours as needed for itching.   meclizine (ANTIVERT) 25 MG tablet Take 1 tablet (25 mg total) by mouth 3 (three) times daily as needed for dizziness.   MELATONIN PO Take by mouth. Takes a gummy at night   Potassium 99 MG TABS Take 99 mg by mouth daily.   sildenafil (REVATIO) 20 MG tablet TAKE 5 TABLETS BY MOUTH EVERY DAY AS NEEDED 2 HOURS BEFORE SEX   tadalafil (CIALIS) 20 MG tablet Take 0.5-1 tablets (10-20 mg total) by mouth every other day as needed for erectile dysfunction.   No facility-administered encounter medications on file as of 11/28/2020.  :   Review of Systems:  Out of a complete 14 point review of systems, all are reviewed and negative with the exception of these symptoms as listed below:  Review of Systems  Neurological:        Pt is here for sleep consult Pt denies  snoring , headaches . Pt has hypertension and fatigue throughout the day. Pt states he had a sleep study done 12 years ago . He did have a CPAP machine but he threw away .    ESS:7 FSS:17   Objective:  Neurological Exam  Physical Exam Physical Examination:   Vitals:   11/28/20 1356  BP: (!) 168/84  Pulse: 75   General Examination: The patient is a very pleasant 70 y.o. male in no acute distress. He appears well-developed and well-nourished and well groomed.   HEENT: Normocephalic, atraumatic, pupils are equal, round and reactive to light, extraocular tracking is good without limitation to gaze excursion or nystagmus noted. Hearing is grossly intact. Face is symmetric with normal facial animation. Speech is clear with no dysarthria noted. There is no hypophonia. There is no lip, neck/head, jaw or voice tremor. Neck with decreased range of motion, no carotid bruits.  Airway examination reveals moderate mouth dryness, adequate dental hygiene, moderate airway crowding secondary to wider uvula, small airway entry.  Tongue protrudes centrally and palate elevates symmetrically, tonsils on the smaller side, neck circumference of 18-3/8 inches.  He has a minimal overbite.  Tracheostomy scar.  Chest: Clear to auscultation without wheezing, rhonchi or crackles noted.  Heart: S1+S2+0, irregular, systolic murmur noted, 2/6.   Abdomen: Soft, non-tender and non-distended.  Extremities: There is chronic appearing discoloration in both distal lower extremities, large scar right shin from a previous dog bite, swelling both lower extremities, left 2+, right 1+.    Skin: Warm and dry without trophic changes noted.   Musculoskeletal: exam reveals no obvious joint deformities.   Neurologically:  Mental status: The patient is awake, alert and oriented in all 4 spheres. His immediate and remote memory, attention, language skills and fund of knowledge are appropriate. There is no evidence of aphasia, agnosia, apraxia or anomia. Speech is clear with normal prosody and enunciation. Thought process is linear. Mood is normal and affect is normal.  Cranial nerves II - XII are as  described above under HEENT exam.  Motor exam: Normal bulk, strength and tone is noted. There is no tremor, fine motor skills and coordination: grossly intact.  Cerebellar testing: No dysmetria or intention tremor. There  is no truncal or gait ataxia.  Sensory exam: intact to light touch in the upper and lower extremities.  Gait, station and balance: He stands easily. No veering to one side is noted. No leaning to one side is noted. Posture is age-appropriate and stance is narrow based. Gait shows normal stride length and normal pace. No problems turning are noted.   Assessment and Plan:  In summary, Dennis Mitchell is a very pleasant 70 y.o.-year old male with an underlying medical history of arthritis, anxiety, back pain, diabetes, bradycardia, PACs, vitamin D deficiency, hypertension, hyperlipidemia, neuropathy, history of multiple injuries in 2010 with s/p surgeries including neck surgery, history of tracheostomy, and obesity with status post bariatric surgery (gastric sleeve), who presents for evaluation of his previous diagnosis of obstructive sleep apnea.  He had a CPAP machine in the past but achieved significant weight loss after his gastric sleeve in 2017.  He does report daytime tiredness and nocturia.  He is advised to proceed with a sleep study for reevaluation.  If he has residual sleep apnea, I would be happy to treat him with a positive airway pressure device.  He would be willing to consider CPAP therapy again.  We will pick up our discussion after testing and keep him posted as to his test results by phone call as well.  We will plan a follow-up in sleep clinic accordingly.  I answered all his questions today and he was in agreement.  Of note, he sleeps in a recliner, feels like he cannot sleep in a bed although he bought an adjustable bed.  We will try to accommodate as best as possible if we pursue an attended sleep study.  I explained the difference between a laboratory attended sleep  study versus home sleep test.

## 2020-12-02 ENCOUNTER — Institutional Professional Consult (permissible substitution): Payer: Medicare HMO | Admitting: Neurology

## 2020-12-24 ENCOUNTER — Telehealth: Payer: Self-pay | Admitting: Neurology

## 2020-12-24 NOTE — Telephone Encounter (Signed)
LVM for pt to call me back to schedule sleep study  

## 2020-12-28 ENCOUNTER — Other Ambulatory Visit (INDEPENDENT_AMBULATORY_CARE_PROVIDER_SITE_OTHER): Payer: Self-pay | Admitting: Nurse Practitioner

## 2020-12-28 DIAGNOSIS — F331 Major depressive disorder, recurrent, moderate: Secondary | ICD-10-CM

## 2021-01-07 ENCOUNTER — Ambulatory Visit (INDEPENDENT_AMBULATORY_CARE_PROVIDER_SITE_OTHER): Payer: Medicare HMO | Admitting: Neurology

## 2021-01-07 DIAGNOSIS — E669 Obesity, unspecified: Secondary | ICD-10-CM

## 2021-01-07 DIAGNOSIS — R030 Elevated blood-pressure reading, without diagnosis of hypertension: Secondary | ICD-10-CM

## 2021-01-07 DIAGNOSIS — R351 Nocturia: Secondary | ICD-10-CM

## 2021-01-07 DIAGNOSIS — G4733 Obstructive sleep apnea (adult) (pediatric): Secondary | ICD-10-CM

## 2021-01-07 DIAGNOSIS — I499 Cardiac arrhythmia, unspecified: Secondary | ICD-10-CM

## 2021-01-07 DIAGNOSIS — R011 Cardiac murmur, unspecified: Secondary | ICD-10-CM

## 2021-01-07 DIAGNOSIS — R5383 Other fatigue: Secondary | ICD-10-CM

## 2021-01-07 DIAGNOSIS — R600 Localized edema: Secondary | ICD-10-CM

## 2021-01-09 NOTE — Progress Notes (Signed)
[facility]  HOME SLEEP TEST (Watch PAT) REPORT  STUDY DATE: 01/07/2021  DOB: May 20, 1950  MRN: [id number]  ORDERING CLINICIAN: Star Age, MD, PhD   REFERRING CLINICIAN: PCP  CLINICAL INFORMATION/HISTORY: 70 year old right-handed gentleman with an underlying medical history of arthritis, anxiety, back pain, diabetes, bradycardia, PACs, vitamin D deficiency, hypertension, hyperlipidemia, neuropathy, history of multiple injuries in 2010 with s/p surgeries including neck surgery, history of tracheostomy, and obesity with status post bariatric surgery (gastric sleeve), who was previously diagnosed with obstructive sleep apnea and placed on CPAP therapy. He no longer has a CPAP machine.    Epworth sleepiness score: 7/24.  BMI: 35.4 kg/m  FINDINGS:   Sleep Summary:   Total Recording Time (hours, min): 8 hours, 50 minutes  Total Sleep Time (hours, min):  7 hours, 32 minutes   Percent REM (%):    29.7%   Respiratory Indices:   Calculated pAHI (per hour):  49.3/hour         REM pAHI:    54.6/hour       NREM pAHI: 47/hour  Oxygen Saturation Statistics:    Oxygen Saturation (%) Mean: 93%   Minimum oxygen saturation (%):                 77%   O2 Saturation Range (%): 77-99%    O2 Saturation (minutes) <=88%: 7.4 min  Pulse Rate Statistics:   Pulse Mean (bpm):    66/min    Pulse Range (47-104/min)   IMPRESSION: OSA (obstructive sleep apnea), severe  RECOMMENDATION:  This home sleep test demonstrates severe obstructive sleep apnea with a total AHI of 49.3/hour and O2 nadir of 77%.  Mild to moderate snoring was detected intermittently. Treatment with positive airway pressure is highly recommended. This will require - ideally - a full night CPAP titration study for proper treatment settings, O2 monitoring and mask fitting. For now, the patient will be advised to proceed with an autoPAP titration/trial at home.  A laboratory attended titration study can  be considered in the future for optimization of his treatment and better tolerance of therapy.  Alternative treatment options are limited secondary to the severity of the patient's sleep disordered breathing.  Concomitant and additional weight loss may help reduce the severity of his sleep disordered breathing.  Please note, that untreated obstructive sleep apnea may carry additional perioperative morbidity. Patients with significant obstructive sleep apnea should receive perioperative PAP therapy and the surgeons and particularly the anesthesiologist should be informed of the diagnosis and the severity of the sleep disordered breathing. The patient should be cautioned not to drive, work at heights, or operate dangerous or heavy equipment when tired or sleepy. Review and reiteration of good sleep hygiene measures should be pursued with any patient. Other causes of the patient's symptoms, including circadian rhythm disturbances, an underlying mood disorder, medication effect and/or an underlying medical problem cannot be ruled out based on this test. Clinical correlation is recommended. The patient and his referring provider will be notified of the test results. The patient will be seen in follow up in sleep clinic at [facility].  I certify that I have reviewed the raw data recording prior to the issuance of this report in accordance with the standards of the American Academy of Sleep Medicine (AASM).  INTERPRETING PHYSICIAN:   Star Age, MD, PhD  Board Certified in Neurology and Sleep Medicine  [facility] [address], [place], [place] [address] [phone]

## 2021-01-14 NOTE — Addendum Note (Signed)
Addended by: Star Age on: 01/14/2021 12:24 PM   Modules accepted: Orders

## 2021-01-14 NOTE — Procedures (Signed)
[facility]  HOME SLEEP TEST (Watch PAT) REPORT  STUDY DATE: 01/07/2021  DOB: Jan 27, 1950  MRN: [id number]  ORDERING CLINICIAN: Star Age, MD, PhD   REFERRING CLINICIAN: PCP  CLINICAL INFORMATION/HISTORY: 71 year old right-handed gentleman with an underlying medical history of arthritis, anxiety, back pain, diabetes, bradycardia, PACs, vitamin D deficiency, hypertension, hyperlipidemia, neuropathy, history of multiple injuries in 2010 with s/p surgeries including neck surgery, history of tracheostomy, and obesity with status post bariatric surgery (gastric sleeve), who was previously diagnosed with obstructive sleep apnea and placed on CPAP therapy. He no longer has a CPAP machine.    Epworth sleepiness score: 7/24.  BMI: 35.4 kg/m  FINDINGS:   Sleep Summary:   Total Recording Time (hours, min): 8 hours, 50 minutes  Total Sleep Time (hours, min):  7 hours, 32 minutes   Percent REM (%):    29.7%   Respiratory Indices:   Calculated pAHI (per hour):  49.3/hour         REM pAHI:    54.6/hour       NREM pAHI: 47/hour  Oxygen Saturation Statistics:    Oxygen Saturation (%) Mean: 93%   Minimum oxygen saturation (%):                 77%   O2 Saturation Range (%): 77-99%    O2 Saturation (minutes) <=88%: 7.4 min  Pulse Rate Statistics:   Pulse Mean (bpm):    66/min    Pulse Range (47-104/min)   IMPRESSION: OSA (obstructive sleep apnea), severe  RECOMMENDATION:  This home sleep test demonstrates severe obstructive sleep apnea with a total AHI of 49.3/hour and O2 nadir of 77%.  Mild to moderate snoring was detected intermittently. Treatment with positive airway pressure is highly recommended. This will require - ideally - a full night CPAP titration study for proper treatment settings, O2 monitoring and mask fitting. For now, the patient will be advised to proceed with an autoPAP titration/trial at home.  A laboratory attended titration study can  be considered in the future for optimization of his treatment and better tolerance of therapy.  Alternative treatment options are limited secondary to the severity of the patient's sleep disordered breathing.  Concomitant and additional weight loss may help reduce the severity of his sleep disordered breathing.  Please note, that untreated obstructive sleep apnea may carry additional perioperative morbidity. Patients with significant obstructive sleep apnea should receive perioperative PAP therapy and the surgeons and particularly the anesthesiologist should be informed of the diagnosis and the severity of the sleep disordered breathing. The patient should be cautioned not to drive, work at heights, or operate dangerous or heavy equipment when tired or sleepy. Review and reiteration of good sleep hygiene measures should be pursued with any patient. Other causes of the patient's symptoms, including circadian rhythm disturbances, an underlying mood disorder, medication effect and/or an underlying medical problem cannot be ruled out based on this test. Clinical correlation is recommended. The patient and his referring provider will be notified of the test results. The patient will be seen in follow up in sleep clinic at [facility].  I certify that I have reviewed the raw data recording prior to the issuance of this report in accordance with the standards of the American Academy of Sleep Medicine (AASM).  INTERPRETING PHYSICIAN:   Star Age, MD, PhD  Board Certified in Neurology and Sleep Medicine  [facility] [address] [place], [place] [address] [phone]

## 2021-01-16 ENCOUNTER — Telehealth: Payer: Self-pay

## 2021-01-16 NOTE — Telephone Encounter (Signed)
-----   Message from Star Age, MD sent at 01/14/2021 12:24 PM EST ----- Patient referred by his previous PCP, seen by me on 11/28/2020, patient had a HST on 01/07/2021.    Please call and notify the patient that the recent home sleep test showed obstructive sleep apnea in the severe range. I recommend treatment for this in the form of autoPAP, which means, that we don't have to bring him in for a sleep study with CPAP, but will let him start using a so called autoPAP machine at home, through a DME company (of his choice, or as per insurance requirement). The DME representative will fit the patient with a mask of choice, educate him on how to use the machine, how to put the mask on, etc. I have placed an order in the chart. Please send the order to a local DME, talk to patient, send report to referring MD. Please also reinforce the need for compliance with treatment. We will need a FU in sleep clinic for 10 weeks post-PAP set up, please arrange that with me or one of our NPs. Thanks,   Star Age, MD, PhD [facility])

## 2021-01-16 NOTE — Telephone Encounter (Signed)
I called pt. I advised pt that Dr. Rexene Alberts reviewed their sleep study results and found that pt has severe OSA. Dr. Rexene Alberts recommends that pt start on an autopap for treatment. I reviewed PAP compliance expectations with the pt. Pt is agreeable to starting an auto-PAP. I advised pt that an order will be sent to a DME, [facility], and [facility] will call the pt within about one week after they file with the pt's insurance. [facility] will show the pt how to use the machine, fit for masks, and troubleshoot the auto-PAP if needed. A follow up appt was made for insurance purposes with Janett Billow, NP on 04/21/2021 at 1245 pm. Pt verbalized understanding to arrive 15 minutes early and bring their auto-PAP. A letter with all of this information in it will be mailed to the pt as a reminder. I verified with the pt that the address we have on file is correct. Pt verbalized understanding of results. Pt had no questions at this time but was encouraged to call back if questions arise. I have sent the order to [facility] and have received confirmation that they have received the order.

## 2021-03-04 DIAGNOSIS — I10 Essential (primary) hypertension: Secondary | ICD-10-CM | POA: Diagnosis not present

## 2021-03-04 DIAGNOSIS — E119 Type 2 diabetes mellitus without complications: Secondary | ICD-10-CM | POA: Diagnosis not present

## 2021-03-04 DIAGNOSIS — E7800 Pure hypercholesterolemia, unspecified: Secondary | ICD-10-CM | POA: Diagnosis not present

## 2021-03-11 DIAGNOSIS — Z1211 Encounter for screening for malignant neoplasm of colon: Secondary | ICD-10-CM | POA: Diagnosis not present

## 2021-03-11 DIAGNOSIS — E7800 Pure hypercholesterolemia, unspecified: Secondary | ICD-10-CM | POA: Diagnosis not present

## 2021-03-11 DIAGNOSIS — R944 Abnormal results of kidney function studies: Secondary | ICD-10-CM | POA: Diagnosis not present

## 2021-03-11 DIAGNOSIS — R809 Proteinuria, unspecified: Secondary | ICD-10-CM | POA: Diagnosis not present

## 2021-03-11 DIAGNOSIS — E119 Type 2 diabetes mellitus without complications: Secondary | ICD-10-CM | POA: Diagnosis not present

## 2021-03-11 DIAGNOSIS — E559 Vitamin D deficiency, unspecified: Secondary | ICD-10-CM | POA: Diagnosis not present

## 2021-03-11 DIAGNOSIS — Z125 Encounter for screening for malignant neoplasm of prostate: Secondary | ICD-10-CM | POA: Diagnosis not present

## 2021-03-11 DIAGNOSIS — I10 Essential (primary) hypertension: Secondary | ICD-10-CM | POA: Diagnosis not present

## 2021-03-18 ENCOUNTER — Encounter (INDEPENDENT_AMBULATORY_CARE_PROVIDER_SITE_OTHER): Payer: Self-pay | Admitting: *Deleted

## 2021-04-16 NOTE — Telephone Encounter (Signed)
Late entry: received an update from [facility] that patient had no showed their appt for CPAP back on 01/30/21 and hadn't responded to their calls either. The order was set to be voided on 03/28/21.  ?

## 2021-04-16 NOTE — Telephone Encounter (Signed)
Looks like the patient still has an initial AutoPap follow-up scheduled with the nurse practitioner next week.  I called the patient and left a message asking for call back to discuss.  If he is not going to do AutoPap, discuss risk of untreated sleep apnea.  May or may not need this appointment.  ?

## 2021-04-17 DIAGNOSIS — R82998 Other abnormal findings in urine: Secondary | ICD-10-CM | POA: Diagnosis not present

## 2021-04-17 DIAGNOSIS — E1129 Type 2 diabetes mellitus with other diabetic kidney complication: Secondary | ICD-10-CM | POA: Diagnosis not present

## 2021-04-17 DIAGNOSIS — R809 Proteinuria, unspecified: Secondary | ICD-10-CM | POA: Diagnosis not present

## 2021-04-17 DIAGNOSIS — Z716 Tobacco abuse counseling: Secondary | ICD-10-CM | POA: Diagnosis not present

## 2021-04-17 DIAGNOSIS — Z6835 Body mass index (BMI) 35.0-35.9, adult: Secondary | ICD-10-CM | POA: Diagnosis not present

## 2021-04-17 DIAGNOSIS — E1122 Type 2 diabetes mellitus with diabetic chronic kidney disease: Secondary | ICD-10-CM | POA: Diagnosis not present

## 2021-04-17 DIAGNOSIS — N189 Chronic kidney disease, unspecified: Secondary | ICD-10-CM | POA: Diagnosis not present

## 2021-04-17 DIAGNOSIS — E559 Vitamin D deficiency, unspecified: Secondary | ICD-10-CM | POA: Diagnosis not present

## 2021-04-17 DIAGNOSIS — I129 Hypertensive chronic kidney disease with stage 1 through stage 4 chronic kidney disease, or unspecified chronic kidney disease: Secondary | ICD-10-CM | POA: Diagnosis not present

## 2021-04-17 NOTE — Telephone Encounter (Signed)
Contacted pt regarding appt Monday 04/21/21, no answer.  If he is not going to do AutoPap, appt will need to be canceled.  ?

## 2021-04-21 ENCOUNTER — Ambulatory Visit: Payer: Medicare HMO | Admitting: Adult Health

## 2021-04-29 ENCOUNTER — Other Ambulatory Visit (HOSPITAL_COMMUNITY): Payer: Self-pay | Admitting: Nephrology

## 2021-04-29 ENCOUNTER — Other Ambulatory Visit: Payer: Self-pay | Admitting: Nephrology

## 2021-04-29 DIAGNOSIS — E1122 Type 2 diabetes mellitus with diabetic chronic kidney disease: Secondary | ICD-10-CM

## 2021-04-29 DIAGNOSIS — I129 Hypertensive chronic kidney disease with stage 1 through stage 4 chronic kidney disease, or unspecified chronic kidney disease: Secondary | ICD-10-CM

## 2021-05-05 ENCOUNTER — Ambulatory Visit (HOSPITAL_COMMUNITY)
Admission: RE | Admit: 2021-05-05 | Discharge: 2021-05-05 | Disposition: A | Discharge status: Home / Self Care | Payer: Medicare HMO | Source: Ambulatory Visit | Attending: Nephrology | Admitting: Nephrology

## 2021-05-05 DIAGNOSIS — I129 Hypertensive chronic kidney disease with stage 1 through stage 4 chronic kidney disease, or unspecified chronic kidney disease: Secondary | ICD-10-CM | POA: Diagnosis not present

## 2021-05-05 DIAGNOSIS — E1122 Type 2 diabetes mellitus with diabetic chronic kidney disease: Secondary | ICD-10-CM | POA: Diagnosis not present

## 2021-05-05 DIAGNOSIS — R3129 Other microscopic hematuria: Secondary | ICD-10-CM | POA: Diagnosis not present

## 2021-05-08 ENCOUNTER — Other Ambulatory Visit (HOSPITAL_COMMUNITY)
Admission: RE | Admit: 2021-05-08 | Discharge: 2021-05-08 | Disposition: A | Discharge status: Home / Self Care | Payer: Medicare HMO | Source: Ambulatory Visit | Attending: Nephrology | Admitting: Nephrology

## 2021-05-08 DIAGNOSIS — J449 Chronic obstructive pulmonary disease, unspecified: Secondary | ICD-10-CM | POA: Diagnosis not present

## 2021-05-08 DIAGNOSIS — N189 Chronic kidney disease, unspecified: Secondary | ICD-10-CM | POA: Diagnosis not present

## 2021-05-08 DIAGNOSIS — I129 Hypertensive chronic kidney disease with stage 1 through stage 4 chronic kidney disease, or unspecified chronic kidney disease: Secondary | ICD-10-CM | POA: Diagnosis not present

## 2021-05-08 DIAGNOSIS — N1831 Chronic kidney disease, stage 3a: Secondary | ICD-10-CM | POA: Diagnosis not present

## 2021-05-08 DIAGNOSIS — R809 Proteinuria, unspecified: Secondary | ICD-10-CM | POA: Insufficient documentation

## 2021-05-08 DIAGNOSIS — E876 Hypokalemia: Secondary | ICD-10-CM | POA: Diagnosis not present

## 2021-05-08 DIAGNOSIS — E1122 Type 2 diabetes mellitus with diabetic chronic kidney disease: Secondary | ICD-10-CM | POA: Diagnosis not present

## 2021-05-08 DIAGNOSIS — E1129 Type 2 diabetes mellitus with other diabetic kidney complication: Secondary | ICD-10-CM | POA: Diagnosis not present

## 2021-05-08 DIAGNOSIS — N3289 Other specified disorders of bladder: Secondary | ICD-10-CM | POA: Diagnosis not present

## 2021-05-08 DIAGNOSIS — E871 Hypo-osmolality and hyponatremia: Secondary | ICD-10-CM | POA: Diagnosis not present

## 2021-05-08 LAB — RENAL FUNCTION PANEL
Albumin: 3.6 g/dL (ref 3.5–5.0)
Anion gap: 7 (ref 5–15)
BUN: 17 mg/dL (ref 8–23)
CO2: 30 mmol/L (ref 22–32)
Calcium: 8.6 mg/dL — ABNORMAL LOW (ref 8.9–10.3)
Chloride: 95 mmol/L — ABNORMAL LOW (ref 98–111)
Creatinine, Ser: 0.93 mg/dL (ref 0.61–1.24)
GFR, Estimated: >60 mL/min (ref >60)
Glucose, Bld: 122 mg/dL — ABNORMAL HIGH (ref 70–99)
Phosphorus: 3.2 mg/dL (ref 2.5–4.6)
Potassium: 3.2 mmol/L — ABNORMAL LOW (ref 3.5–5.1)
Sodium: 132 mmol/L — ABNORMAL LOW (ref 135–145)

## 2021-05-12 DIAGNOSIS — Z6835 Body mass index (BMI) 35.0-35.9, adult: Secondary | ICD-10-CM | POA: Diagnosis not present

## 2021-05-12 DIAGNOSIS — D511 Vitamin B12 deficiency anemia due to selective vitamin B12 malabsorption with proteinuria: Secondary | ICD-10-CM | POA: Diagnosis not present

## 2021-05-12 DIAGNOSIS — R809 Proteinuria, unspecified: Secondary | ICD-10-CM | POA: Diagnosis not present

## 2021-05-12 DIAGNOSIS — E1122 Type 2 diabetes mellitus with diabetic chronic kidney disease: Secondary | ICD-10-CM | POA: Diagnosis not present

## 2021-05-12 DIAGNOSIS — N189 Chronic kidney disease, unspecified: Secondary | ICD-10-CM | POA: Diagnosis not present

## 2021-05-12 DIAGNOSIS — E559 Vitamin D deficiency, unspecified: Secondary | ICD-10-CM | POA: Diagnosis not present

## 2021-05-12 DIAGNOSIS — I129 Hypertensive chronic kidney disease with stage 1 through stage 4 chronic kidney disease, or unspecified chronic kidney disease: Secondary | ICD-10-CM | POA: Diagnosis not present

## 2021-05-12 DIAGNOSIS — R82998 Other abnormal findings in urine: Secondary | ICD-10-CM | POA: Diagnosis not present

## 2021-05-12 DIAGNOSIS — E1129 Type 2 diabetes mellitus with other diabetic kidney complication: Secondary | ICD-10-CM | POA: Diagnosis not present

## 2021-05-15 ENCOUNTER — Ambulatory Visit: Payer: Medicare HMO | Admitting: Urology

## 2021-05-15 ENCOUNTER — Encounter: Payer: Self-pay | Admitting: Urology

## 2021-05-15 VITALS — BP 139/72 | HR 83 | Wt 225.0 lb

## 2021-05-15 DIAGNOSIS — R310 Gross hematuria: Secondary | ICD-10-CM | POA: Diagnosis not present

## 2021-05-15 DIAGNOSIS — D494 Neoplasm of unspecified behavior of bladder: Secondary | ICD-10-CM | POA: Insufficient documentation

## 2021-05-15 DIAGNOSIS — N3289 Other specified disorders of bladder: Secondary | ICD-10-CM

## 2021-05-15 LAB — MICROSCOPIC EXAMINATION
Epithelial Cells (non renal): NONE SEEN /hpf (ref 0–10)
RBC, Urine: NONE SEEN /hpf (ref 0–2)
Renal Epithel, UA: NONE SEEN /hpf
WBC, UA: NONE SEEN /hpf (ref 0–5)

## 2021-05-15 LAB — URINALYSIS, ROUTINE W REFLEX MICROSCOPIC
Bilirubin, UA: NEGATIVE
Glucose, UA: NEGATIVE
Ketones, UA: NEGATIVE
Leukocytes,UA: NEGATIVE
Nitrite, UA: NEGATIVE
RBC, UA: NEGATIVE
Specific Gravity, UA: 1.02 (ref 1.005–1.030)
Urobilinogen, Ur: 2 mg/dL — ABNORMAL HIGH (ref 0.2–1.0)
pH, UA: 6 (ref 5.0–7.5)
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
## 2021-05-23 DIAGNOSIS — N3289 Other specified disorders of bladder: Secondary | ICD-10-CM | POA: Diagnosis not present

## 2021-05-23 DIAGNOSIS — Z6834 Body mass index (BMI) 34.0-34.9, adult: Secondary | ICD-10-CM | POA: Diagnosis not present

## 2021-05-23 DIAGNOSIS — E871 Hypo-osmolality and hyponatremia: Secondary | ICD-10-CM | POA: Diagnosis not present

## 2021-05-23 DIAGNOSIS — D841 Defects in the complement system: Secondary | ICD-10-CM | POA: Diagnosis not present

## 2021-05-23 DIAGNOSIS — I129 Hypertensive chronic kidney disease with stage 1 through stage 4 chronic kidney disease, or unspecified chronic kidney disease: Secondary | ICD-10-CM | POA: Diagnosis not present

## 2021-05-23 DIAGNOSIS — E1122 Type 2 diabetes mellitus with diabetic chronic kidney disease: Secondary | ICD-10-CM | POA: Diagnosis not present

## 2021-05-23 DIAGNOSIS — N189 Chronic kidney disease, unspecified: Secondary | ICD-10-CM | POA: Diagnosis not present

## 2021-05-23 DIAGNOSIS — E876 Hypokalemia: Secondary | ICD-10-CM | POA: Diagnosis not present

## 2021-05-23 DIAGNOSIS — R809 Proteinuria, unspecified: Secondary | ICD-10-CM | POA: Diagnosis not present

## 2021-05-29 ENCOUNTER — Other Ambulatory Visit: Payer: Medicare HMO | Admitting: Urology

## 2021-06-06 ENCOUNTER — Ambulatory Visit (HOSPITAL_COMMUNITY)
Admission: RE | Admit: 2021-06-06 | Discharge: 2021-06-06 | Disposition: A | Discharge status: Home / Self Care | Payer: Medicare HMO | Source: Ambulatory Visit | Attending: Urology | Admitting: Urology

## 2021-06-06 DIAGNOSIS — R310 Gross hematuria: Secondary | ICD-10-CM | POA: Insufficient documentation

## 2021-06-06 DIAGNOSIS — N281 Cyst of kidney, acquired: Secondary | ICD-10-CM | POA: Diagnosis not present

## 2021-06-06 MED ORDER — IOHEXOL 300 MG/ML  SOLN
100.0000 mL | Freq: Once | INTRAMUSCULAR | Status: AC | PRN
  Administered 2021-06-06: 100 mL via INTRAVENOUS

## 2021-06-12 ENCOUNTER — Telehealth: Payer: Self-pay

## 2021-06-12 NOTE — Telephone Encounter (Signed)
I contacted patient to schedule his cysto for 2-3 weeks. Patient advised he would like for you to contact him to go over the results of the CT before scheduling Cysto. I advised patient I would advised you of same.   Thank you.

## 2021-06-16 DIAGNOSIS — E119 Type 2 diabetes mellitus without complications: Secondary | ICD-10-CM | POA: Diagnosis not present

## 2021-06-19 ENCOUNTER — Telehealth: Payer: Self-pay

## 2021-06-19 ENCOUNTER — Ambulatory Visit (INDEPENDENT_AMBULATORY_CARE_PROVIDER_SITE_OTHER): Payer: Medicare HMO | Admitting: Urology

## 2021-06-19 VITALS — BP 162/75 | HR 92

## 2021-06-19 DIAGNOSIS — M545 Low back pain, unspecified: Secondary | ICD-10-CM | POA: Diagnosis not present

## 2021-06-19 DIAGNOSIS — E1165 Type 2 diabetes mellitus with hyperglycemia: Secondary | ICD-10-CM | POA: Diagnosis not present

## 2021-06-19 DIAGNOSIS — E559 Vitamin D deficiency, unspecified: Secondary | ICD-10-CM | POA: Diagnosis not present

## 2021-06-19 DIAGNOSIS — E876 Hypokalemia: Secondary | ICD-10-CM | POA: Diagnosis not present

## 2021-06-19 DIAGNOSIS — R310 Gross hematuria: Secondary | ICD-10-CM

## 2021-06-19 DIAGNOSIS — I10 Essential (primary) hypertension: Secondary | ICD-10-CM | POA: Diagnosis not present

## 2021-06-19 DIAGNOSIS — N3289 Other specified disorders of bladder: Secondary | ICD-10-CM | POA: Diagnosis not present

## 2021-06-19 DIAGNOSIS — Z125 Encounter for screening for malignant neoplasm of prostate: Secondary | ICD-10-CM | POA: Diagnosis not present

## 2021-06-19 DIAGNOSIS — R944 Abnormal results of kidney function studies: Secondary | ICD-10-CM | POA: Diagnosis not present

## 2021-06-19 DIAGNOSIS — R809 Proteinuria, unspecified: Secondary | ICD-10-CM | POA: Diagnosis not present

## 2021-06-19 DIAGNOSIS — E7800 Pure hypercholesterolemia, unspecified: Secondary | ICD-10-CM | POA: Diagnosis not present

## 2021-06-19 LAB — URINALYSIS, ROUTINE W REFLEX MICROSCOPIC
Bilirubin, UA: NEGATIVE
Glucose, UA: NEGATIVE
Leukocytes,UA: NEGATIVE
Nitrite, UA: NEGATIVE
Specific Gravity, UA: 1.025 (ref 1.005–1.030)
Urobilinogen, Ur: 4 mg/dL — ABNORMAL HIGH (ref 0.2–1.0)
pH, UA: 5.5 (ref 5.0–7.5)

## 2021-06-19 LAB — MICROSCOPIC EXAMINATION
Renal Epithel, UA: NONE SEEN /hpf
WBC, UA: NONE SEEN /hpf (ref 0–5)

## 2021-06-19 MED ORDER — CIPROFLOXACIN HCL 500 MG PO TABS
500.0000 mg | ORAL_TABLET | Freq: Once | ORAL | Status: AC
  Administered 2021-06-19: 500 mg via ORAL
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
## 2021-06-19 NOTE — Telephone Encounter (Signed)
I spoke with Mr. Gens. We have discussed possible surgery dates and 07/02/2021 was agreed upon by all parties. Patient given information about surgery date, what to expect pre-operatively and post operatively.    We discussed that a pre-op nurse will be calling to set up the pre-op visit that will take place prior to surgery. Informed patient that our office will communicate any additional care to be provided after surgery.    Patients questions or concerns were discussed during our call. Advised to call our office should there be any additional information, questions or concerns that arise. Patient verbalized understanding.
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
## 2021-06-27 NOTE — Patient Instructions (Addendum)
Dennis Mitchell  06/27/2021     '@PREFPERIOPPHARMACY'$ @   Your procedure is scheduled on  07/02/2021.   Report to [facility] at  0730  A.M.   Call this number if you have problems the morning of surgery:  [phone]   Remember:  Do not eat or drink after midnight.       DO NOT take any medications for diabetes the morning of your procedure.    Take these medicines the morning of surgery with A SIP OF WATER       norvasc, celexa, flexeril(if needed), vistaril, antivert (if needed).     Do not wear jewelry, make-up or nail polish.  Do not wear lotions, powders, or perfumes, or deodorant.  Do not shave 48 hours prior to surgery.  Men may shave face and neck.  Do not bring valuables to the hospital.  [facility] is not responsible for any belongings or valuables.  Contacts, dentures or bridgework may not be worn into surgery.  Leave your suitcase in the car.  After surgery it may be brought to your room.  For patients admitted to the hospital, discharge time will be determined by your treatment team.  Patients discharged the day of surgery will not be allowed to drive home and must have someone with them for 24 hours.    Special instructions:   DO NOT smoke tobacco or vape for 24 hours before your procedure.  Please read over the following fact sheets that you were given. Coughing and Deep Breathing, Surgical Site Infection Prevention, Anesthesia Post-op Instructions, and Care and Recovery After Surgery      Transurethral Resection of Bladder Tumor, Care After The following information offers guidance on how to care for yourself after your procedure. Your health care provider may also give you more specific instructions. If you have problems or questions, contact your health care provider. What can I expect after the procedure? After the procedure, it is common to have: A small amount of blood or small blood clots in your urine for up to 2 weeks. Soreness or  mild pain from your catheter. After your catheter is removed, you may have mild soreness, especially when urinating. A need to urinate often. Pain in your lower abdomen. Follow these instructions at home: Medicines  Take over-the-counter and prescription medicines only as told by your health care provider. If you were prescribed an antibiotic medicine, take it as told by your health care provider. Do not stop taking the antibiotic even if you start to feel better. Ask your health care provider if the medicine prescribed to you: Requires you to avoid driving or using machinery. Can cause constipation. You may need to take these actions to prevent or treat constipation: Drink enough fluid to keep your urine pale yellow. Take over-the-counter or prescription medicines. Eat foods that are high in fiber, such as beans, whole grains, and fresh fruits and vegetables. Limit foods that are high in fat and processed sugars, such as fried or sweet foods. Activity  If you were given a sedative during the procedure, it can affect you for several hours. Do not drive or operate machinery until your health care provider says that it is safe. Rest as told by your health care provider. Avoid sitting for a long time without moving. Get up to take short walks every 1-2 hours. This is important to improve blood flow and breathing. Ask for help if you feel weak or unsteady.  Do not lift anything that is heavier than 10 lb (4.5 kg), or the limit that you are told, until your health care provider says that it is safe. Avoid intense physical activity for as long as told by your health care provider. Do not have sex until your health care provider approves. Return to your normal activities as told by your health care provider. Ask your health care provider what activities are safe for you. General instructions If you have a catheter, follow instructions from your health care provider about caring for your catheter and  your drainage bag. Do not drink alcohol for as long as told by your health care provider. This is especially important if you are taking prescription pain medicines. Do not use any products that contain nicotine or tobacco. These products include cigarettes, chewing tobacco, and vaping devices, such as e-cigarettes. If you need help quitting, ask your health care provider. Wear compression stockings as told by your health care provider. These stockings help to prevent blood clots and reduce swelling in your legs. Keep all follow-up visits. This is important. You will need to be followed closely with regular checks of your bladder and urethra (cystoscopies) to make sure that the cancer does not come back. Contact a health care provider if: You have blood in your urine for more than 2 weeks. You become constipated. Signs of constipation may include: Having fewer than three bowel movements in a week. Difficulty having a bowel movement. Stools that are dry, hard, or larger than normal. You have a urinary catheter in place, and you have: Spasms or pain. Problems with your catheter or your catheter is blocked. Your catheter has been taken out but you are unable to urinate. You have signs of infection, such as: Fever or chills. Cloudy or bad-smelling urine. Get help right away if: You have severe abdominal pain that gets worse or does not improve with medicine. You have a lot of large blood clots in your urine. You develop swelling or pain in your leg. You have difficulty breathing. These symptoms may be an emergency. Get help right away. Call 911. Do not wait to see if the symptoms will go away. Do not drive yourself to the hospital. Summary After your procedure, it is common to have a small amount of blood or small blood clots in your urine, soreness or mild pain from your catheter, and pain in your lower abdomen. Take over-the-counter and prescription medicines only as told by your health  care provider. Rest as told by your health care provider. Follow your health care provider's instructions about returning to normal activities. Ask what activities are safe for you. If you have a catheter, follow instructions from your health care provider about caring for your catheter and your drainage bag. This information is not intended to replace advice given to you by your health care provider. Make sure you discuss any questions you have with your health care provider. Document Revised: 01/03/2021 Document Reviewed: 01/03/2021 Elsevier Patient Education  [place] Anesthesia, Adult, Care After This sheet gives you information about how to care for yourself after your procedure. Your health care provider may also give you more specific instructions. If you have problems or questions, contact your health care provider. What can I expect after the procedure? After the procedure, the following side effects are common: Pain or discomfort at the IV site. Nausea. Vomiting. Sore throat. Trouble concentrating. Feeling cold or chills. Feeling weak or tired. Sleepiness and fatigue. Soreness  and body aches. These side effects can affect parts of the body that were not involved in surgery. Follow these instructions at home: For the time period you were told by your health care provider:  Rest. Do not participate in activities where you could fall or become injured. Do not drive or use machinery. Do not drink alcohol. Do not take sleeping pills or medicines that cause drowsiness. Do not make important decisions or sign legal documents. Do not take care of children on your own. Eating and drinking Follow any instructions from your health care provider about eating or drinking restrictions. When you feel hungry, start by eating small amounts of foods that are soft and easy to digest (bland), such as toast. Gradually return to your regular diet. Drink enough fluid to keep  your urine pale yellow. If you vomit, rehydrate by drinking water, juice, or clear broth. General instructions If you have sleep apnea, surgery and certain medicines can increase your risk for breathing problems. Follow instructions from your health care provider about wearing your sleep device: Anytime you are sleeping, including during daytime naps. While taking prescription pain medicines, sleeping medicines, or medicines that make you drowsy. Have a responsible adult stay with you for the time you are told. It is important to have someone help care for you until you are awake and alert. Return to your normal activities as told by your health care provider. Ask your health care provider what activities are safe for you. Take over-the-counter and prescription medicines only as told by your health care provider. If you smoke, do not smoke without supervision. Keep all follow-up visits as told by your health care provider. This is important. Contact a health care provider if: You have nausea or vomiting that does not get better with medicine. You cannot eat or drink without vomiting. You have pain that does not get better with medicine. You are unable to pass urine. You develop a skin rash. You have a fever. You have redness around your IV site that gets worse. Get help right away if: You have difficulty breathing. You have chest pain. You have blood in your urine or stool, or you vomit blood. Summary After the procedure, it is common to have a sore throat or nausea. It is also common to feel tired. Have a responsible adult stay with you for the time you are told. It is important to have someone help care for you until you are awake and alert. When you feel hungry, start by eating small amounts of foods that are soft and easy to digest (bland), such as toast. Gradually return to your regular diet. Drink enough fluid to keep your urine pale yellow. Return to your normal activities as told  by your health care provider. Ask your health care provider what activities are safe for you. This information is not intended to replace advice given to you by your health care provider. Make sure you discuss any questions you have with your health care provider. Document Revised: 09/14/2019 Document Reviewed: 04/13/2019 Elsevier Patient Education  [place].

## 2021-06-30 ENCOUNTER — Encounter (HOSPITAL_COMMUNITY)
Admission: RE | Admit: 2021-06-30 | Discharge: 2021-06-30 | Disposition: A | Discharge status: Home / Self Care | Payer: Medicare HMO | Source: Ambulatory Visit | Attending: Urology | Admitting: Urology

## 2021-06-30 ENCOUNTER — Other Ambulatory Visit: Payer: Self-pay | Admitting: Urology

## 2021-06-30 VITALS — BP 168/71 | HR 72 | Temp 97.3°F | Resp 18 | Ht 67.0 in | Wt 216.0 lb

## 2021-06-30 DIAGNOSIS — I10 Essential (primary) hypertension: Secondary | ICD-10-CM | POA: Diagnosis not present

## 2021-06-30 DIAGNOSIS — E119 Type 2 diabetes mellitus without complications: Secondary | ICD-10-CM | POA: Insufficient documentation

## 2021-06-30 DIAGNOSIS — Z01818 Encounter for other preprocedural examination: Secondary | ICD-10-CM | POA: Diagnosis not present

## 2021-06-30 DIAGNOSIS — R310 Gross hematuria: Secondary | ICD-10-CM | POA: Insufficient documentation

## 2021-06-30 LAB — CBC WITH DIFFERENTIAL/PLATELET
Abs Immature Granulocytes: 0.04 10*3/uL (ref 0.00–0.07)
Basophils Absolute: 0.1 10*3/uL (ref 0.0–0.1)
Basophils Relative: 1 %
Eosinophils Absolute: 0.2 10*3/uL (ref 0.0–0.5)
Eosinophils Relative: 2 %
HCT: 44.8 % (ref 39.0–52.0)
Hemoglobin: 15.9 g/dL (ref 13.0–17.0)
Immature Granulocytes: 1 %
Lymphocytes Relative: 17 %
Lymphs Abs: 1.5 10*3/uL (ref 0.7–4.0)
MCH: 31.4 pg (ref 26.0–34.0)
MCHC: 35.5 g/dL (ref 30.0–36.0)
MCV: 88.4 fL (ref 80.0–100.0)
Monocytes Absolute: 0.7 10*3/uL (ref 0.1–1.0)
Monocytes Relative: 8 %
Neutro Abs: 6.3 10*3/uL (ref 1.7–7.7)
Neutrophils Relative %: 71 %
Platelets: 220 10*3/uL (ref 150–400)
RBC: 5.07 MIL/uL (ref 4.22–5.81)
RDW: 13 % (ref 11.5–15.5)
WBC: 8.8 10*3/uL (ref 4.0–10.5)
nRBC: 0 % (ref 0.0–0.2)

## 2021-06-30 LAB — BASIC METABOLIC PANEL
Anion gap: 9 (ref 5–15)
BUN: 27 mg/dL — ABNORMAL HIGH (ref 8–23)
CO2: 27 mmol/L (ref 22–32)
Calcium: 9.1 mg/dL (ref 8.9–10.3)
Chloride: 97 mmol/L — ABNORMAL LOW (ref 98–111)
Creatinine, Ser: 1.14 mg/dL (ref 0.61–1.24)
GFR, Estimated: >60 mL/min (ref >60)
Glucose, Bld: 105 mg/dL — ABNORMAL HIGH (ref 70–99)
Potassium: 3.5 mmol/L (ref 3.5–5.1)
Sodium: 133 mmol/L — ABNORMAL LOW (ref 135–145)

## 2021-06-30 LAB — HEMOGLOBIN A1C
Hgb A1c MFr Bld: 5.7 % — ABNORMAL HIGH (ref 4.8–5.6)
Mean Plasma Glucose: 116.89 mg/dL

## 2021-07-02 ENCOUNTER — Ambulatory Visit (HOSPITAL_COMMUNITY)
Admission: RE | Admit: 2021-07-02 | Discharge: 2021-07-02 | Disposition: A | Discharge status: Home / Self Care | Payer: Medicare HMO | Source: Ambulatory Visit | Attending: Urology | Admitting: Urology

## 2021-07-02 ENCOUNTER — Ambulatory Visit (HOSPITAL_BASED_OUTPATIENT_CLINIC_OR_DEPARTMENT_OTHER): Payer: Medicare HMO | Admitting: Anesthesiology

## 2021-07-02 ENCOUNTER — Ambulatory Visit (HOSPITAL_COMMUNITY): Payer: Medicare HMO

## 2021-07-02 ENCOUNTER — Encounter (HOSPITAL_COMMUNITY): Payer: Self-pay | Admitting: Urology

## 2021-07-02 ENCOUNTER — Encounter (HOSPITAL_COMMUNITY)
Admission: RE | Disposition: A | Discharge status: Home / Self Care | Payer: Self-pay | Source: Ambulatory Visit | Attending: Urology

## 2021-07-02 ENCOUNTER — Ambulatory Visit (HOSPITAL_COMMUNITY): Payer: Medicare HMO | Admitting: Anesthesiology

## 2021-07-02 DIAGNOSIS — F17210 Nicotine dependence, cigarettes, uncomplicated: Secondary | ICD-10-CM | POA: Insufficient documentation

## 2021-07-02 DIAGNOSIS — E119 Type 2 diabetes mellitus without complications: Secondary | ICD-10-CM

## 2021-07-02 DIAGNOSIS — M4712 Other spondylosis with myelopathy, cervical region: Secondary | ICD-10-CM

## 2021-07-02 DIAGNOSIS — Z79899 Other long term (current) drug therapy: Secondary | ICD-10-CM | POA: Diagnosis not present

## 2021-07-02 DIAGNOSIS — I10 Essential (primary) hypertension: Secondary | ICD-10-CM | POA: Diagnosis not present

## 2021-07-02 DIAGNOSIS — N3289 Other specified disorders of bladder: Secondary | ICD-10-CM

## 2021-07-02 DIAGNOSIS — Z9884 Bariatric surgery status: Secondary | ICD-10-CM | POA: Insufficient documentation

## 2021-07-02 DIAGNOSIS — G4733 Obstructive sleep apnea (adult) (pediatric): Secondary | ICD-10-CM

## 2021-07-02 DIAGNOSIS — G4700 Insomnia, unspecified: Secondary | ICD-10-CM

## 2021-07-02 DIAGNOSIS — F32A Depression, unspecified: Secondary | ICD-10-CM

## 2021-07-02 DIAGNOSIS — F418 Other specified anxiety disorders: Secondary | ICD-10-CM | POA: Diagnosis not present

## 2021-07-02 DIAGNOSIS — N179 Acute kidney failure, unspecified: Secondary | ICD-10-CM

## 2021-07-02 DIAGNOSIS — N529 Male erectile dysfunction, unspecified: Secondary | ICD-10-CM

## 2021-07-02 DIAGNOSIS — F17290 Nicotine dependence, other tobacco product, uncomplicated: Secondary | ICD-10-CM | POA: Insufficient documentation

## 2021-07-02 DIAGNOSIS — H8110 Benign paroxysmal vertigo, unspecified ear: Secondary | ICD-10-CM

## 2021-07-02 DIAGNOSIS — D090 Carcinoma in situ of bladder: Secondary | ICD-10-CM | POA: Insufficient documentation

## 2021-07-02 DIAGNOSIS — G8929 Other chronic pain: Secondary | ICD-10-CM

## 2021-07-02 DIAGNOSIS — R310 Gross hematuria: Secondary | ICD-10-CM | POA: Insufficient documentation

## 2021-07-02 DIAGNOSIS — I959 Hypotension, unspecified: Secondary | ICD-10-CM

## 2021-07-02 DIAGNOSIS — I878 Other specified disorders of veins: Secondary | ICD-10-CM

## 2021-07-02 DIAGNOSIS — T7840XA Allergy, unspecified, initial encounter: Secondary | ICD-10-CM

## 2021-07-02 DIAGNOSIS — D414 Neoplasm of uncertain behavior of bladder: Secondary | ICD-10-CM | POA: Diagnosis not present

## 2021-07-02 DIAGNOSIS — J189 Pneumonia, unspecified organism: Secondary | ICD-10-CM

## 2021-07-02 HISTORY — PX: TRANSURETHRAL RESECTION OF BLADDER TUMOR: SHX2575

## 2021-07-02 HISTORY — PX: CYSTOSCOPY W/ URETERAL STENT PLACEMENT: SHX1429

## 2021-07-02 LAB — GLUCOSE, CAPILLARY
Glucose-Capillary: 82 mg/dL (ref 70–99)
Glucose-Capillary: 112 mg/dL — ABNORMAL HIGH (ref 70–99)

## 2021-07-02 SURGERY — TURBT (TRANSURETHRAL RESECTION OF BLADDER TUMOR)
Anesthesia: General | Site: Ureter | Laterality: Right

## 2021-07-02 MED ORDER — CIPROFLOXACIN IN D5W 400 MG/200ML IV SOLN
400.0000 mg | INTRAVENOUS | Status: AC
  Administered 2021-07-02: 400 mg via INTRAVENOUS

## 2021-07-02 MED ORDER — CIPROFLOXACIN IN D5W 400 MG/200ML IV SOLN
INTRAVENOUS | Status: AC
  Filled 2021-07-02: qty 200

## 2021-07-02 MED ORDER — ORAL CARE MOUTH RINSE: 15.0000 mL | Freq: Once | OROMUCOSAL | Status: DC

## 2021-07-02 MED ORDER — ONDANSETRON HCL 4 MG/2ML IJ SOLN: 4.0000 mg | Freq: Once | INTRAMUSCULAR | Status: DC | PRN

## 2021-07-02 MED ORDER — LIDOCAINE HCL (CARDIAC) PF 50 MG/5ML IV SOSY
PREFILLED_SYRINGE | INTRAVENOUS | Status: DC | PRN
  Administered 2021-07-02: 100 mg via INTRAVENOUS

## 2021-07-02 MED ORDER — FENTANYL CITRATE (PF) 100 MCG/2ML IJ SOLN
INTRAMUSCULAR | Status: DC | PRN
  Administered 2021-07-02 (×2): 50 ug via INTRAVENOUS

## 2021-07-02 MED ORDER — PROPOFOL 10 MG/ML IV BOLUS
INTRAVENOUS | Status: DC | PRN
  Administered 2021-07-02: 200 mg via INTRAVENOUS

## 2021-07-02 MED ORDER — ROCURONIUM 10MG/ML (10ML) SYRINGE FOR MEDFUSION PUMP - OPTIME
INTRAVENOUS | Status: DC | PRN
  Administered 2021-07-02: 20 mg via INTRAVENOUS

## 2021-07-02 MED ORDER — EPHEDRINE SULFATE (PRESSORS) 50 MG/ML IJ SOLN
INTRAMUSCULAR | Status: DC | PRN
  Administered 2021-07-02 (×2): 10 mg via INTRAVENOUS

## 2021-07-02 MED ORDER — FENTANYL CITRATE (PF) 100 MCG/2ML IJ SOLN
INTRAMUSCULAR | Status: AC
  Filled 2021-07-02: qty 2

## 2021-07-02 MED ORDER — CHLORHEXIDINE GLUCONATE 0.12 % MT SOLN: 15.0000 mL | Freq: Once | OROMUCOSAL | Status: DC

## 2021-07-02 MED ORDER — SUCCINYLCHOLINE 20MG/ML (10ML) SYRINGE FOR MEDFUSION PUMP - OPTIME
INTRAMUSCULAR | Status: DC | PRN
  Administered 2021-07-02: 170 mg via INTRAVENOUS

## 2021-07-02 MED ORDER — MIDAZOLAM HCL 2 MG/2ML IJ SOLN
INTRAMUSCULAR | Status: AC
  Filled 2021-07-02: qty 2

## 2021-07-02 MED ORDER — EPHEDRINE 5 MG/ML INJ
INTRAVENOUS | Status: AC
  Filled 2021-07-02: qty 5

## 2021-07-02 MED ORDER — DIATRIZOATE MEGLUMINE 30 % UR SOLN
URETHRAL | Status: DC | PRN
  Administered 2021-07-02: 17 mL via URETHRAL

## 2021-07-02 MED ORDER — LACTATED RINGERS IV SOLN: INTRAVENOUS | Status: DC | PRN

## 2021-07-02 MED ORDER — SUGAMMADEX SODIUM 200 MG/2ML IV SOLN
INTRAVENOUS | Status: DC | PRN
  Administered 2021-07-02: 200 mg via INTRAVENOUS

## 2021-07-02 MED ORDER — STERILE WATER FOR IRRIGATION IR SOLN
Status: DC | PRN
  Administered 2021-07-02: 500 mL

## 2021-07-02 MED ORDER — SODIUM CHLORIDE 0.9 % IR SOLN
Status: DC | PRN
  Administered 2021-07-02 (×2): 3000 mL

## 2021-07-02 MED ORDER — LIDOCAINE HCL URETHRAL/MUCOSAL 2 % EX GEL
CUTANEOUS | Status: DC | PRN
  Administered 2021-07-02: 1

## 2021-07-02 MED ORDER — LIDOCAINE HCL URETHRAL/MUCOSAL 2 % EX GEL
CUTANEOUS | Status: AC
  Filled 2021-07-02: qty 10

## 2021-07-02 MED ORDER — PHENAZOPYRIDINE HCL 200 MG PO TABS
200.0000 mg | ORAL_TABLET | Freq: Three times a day (TID) | ORAL | 1 refills | Status: DC | PRN
Start: 2021-07-02 — End: 2022-06-27

## 2021-07-02 MED ORDER — ONDANSETRON HCL 4 MG/2ML IJ SOLN
INTRAMUSCULAR | Status: DC | PRN
  Administered 2021-07-02: 4 mg via INTRAVENOUS

## 2021-07-02 MED ORDER — CIPROFLOXACIN HCL 500 MG PO TABS
500.0000 mg | ORAL_TABLET | Freq: Every day | ORAL | 0 refills | Status: AC
Start: 2021-07-02 — End: 2021-07-09

## 2021-07-02 MED ORDER — MIDAZOLAM HCL 5 MG/5ML IJ SOLN
INTRAMUSCULAR | Status: DC | PRN
  Administered 2021-07-02: 2 mg via INTRAVENOUS

## 2021-07-02 MED ORDER — DEXAMETHASONE SODIUM PHOSPHATE 10 MG/ML IJ SOLN
INTRAMUSCULAR | Status: DC | PRN
  Administered 2021-07-02: 10 mg via INTRAVENOUS

## 2021-07-02 MED ORDER — LACTATED RINGERS IV SOLN
INTRAVENOUS | Status: DC
Start: 2021-07-02 — End: 2021-07-02

## 2021-07-02 MED ORDER — HYDROMORPHONE HCL 1 MG/ML IJ SOLN: 0.2500 mg | INTRAMUSCULAR | Status: DC | PRN

## 2021-07-02 MED ORDER — DIATRIZOATE MEGLUMINE 30 % UR SOLN
URETHRAL | Status: AC
  Filled 2021-07-02: qty 100

## 2021-07-02 SURGICAL SUPPLY — 27 items
BAG DRAIN URO TABLE W/ADPT NS (BAG) ×3 IMPLANT
BAG DRN 8 ADPR NS SKTRN CSTL (BAG) ×2
BAG DRN RND TRDRP ANRFLXCHMBR (UROLOGICAL SUPPLIES) ×2
BAG HAMPER (MISCELLANEOUS) ×3 IMPLANT
BAG URINE DRAIN 2000ML AR STRL (UROLOGICAL SUPPLIES) ×3 IMPLANT
CATH URET 5FR 28IN OPEN ENDED (CATHETERS) ×3 IMPLANT
CLOTH BEACON ORANGE TIMEOUT ST (SAFETY) ×3 IMPLANT
COVER MAYO STAND XLG (MISCELLANEOUS) ×3 IMPLANT
GLOVE BIO SURGEON STRL SZ8 (GLOVE) ×3 IMPLANT
GLOVE BIOGEL PI IND STRL 7.0 (GLOVE) ×4 IMPLANT
GLOVE BIOGEL PI INDICATOR 7.0 (GLOVE) ×2
GOWN STRL REUS W/TWL LRG LVL3 (GOWN DISPOSABLE) ×3 IMPLANT
GOWN STRL REUS W/TWL XL LVL3 (GOWN DISPOSABLE) ×3 IMPLANT
GUIDEWIRE STR DUAL SENSOR (WIRE) ×3 IMPLANT
IV NS IRRIG 3000ML ARTHROMATIC (IV SOLUTION) ×6 IMPLANT
KIT TURNOVER CYSTO (KITS) ×3 IMPLANT
MANIFOLD NEPTUNE II (INSTRUMENTS) ×3 IMPLANT
PACK CYSTO (CUSTOM PROCEDURE TRAY) ×3 IMPLANT
PAD ARMBOARD 7.5X6 YLW CONV (MISCELLANEOUS) ×3 IMPLANT
PLUG CATH AND CAP STER (CATHETERS) ×3 IMPLANT
STENT URET 6FRX26 CONTOUR (STENTS) ×1 IMPLANT
SYR 10ML LL (SYRINGE) ×3 IMPLANT
SYR 30ML LL (SYRINGE) ×3 IMPLANT
SYR TOOMEY IRRIG 70ML (MISCELLANEOUS) ×3
SYRINGE TOOMEY IRRIG 70ML (MISCELLANEOUS) ×2 IMPLANT
TOWEL OR 17X26 4PK STRL BLUE (TOWEL DISPOSABLE) ×3 IMPLANT
WATER STERILE IRR 500ML POUR (IV SOLUTION) ×3 IMPLANT

## 2021-07-02 NOTE — Transfer of Care (Signed)
Immediate Anesthesia Transfer of Care Note  Patient: Dennis Mitchell  Procedure(s) Performed: TRANSURETHRAL RESECTION OF BLADDER TUMOR (TURBT) (Bladder) CYSTOSCOPY WITH  BILATERAL RETROGRADE PYELOGRAM/URETERAL STENT PLACEMENT (Right: Ureter)  Patient Location: PACU  Anesthesia Type:General  Level of Consciousness: awake  Airway & Oxygen Therapy: Patient Spontanous Breathing  Post-op Assessment: Report given to RN  Post vital signs: Reviewed and stable  Last Vitals:  Vitals Value Taken Time  BP 160/84 07/02/21 0955  Temp    Pulse 85 07/02/21 0959  Resp 19 07/02/21 0959  SpO2 98 % 07/02/21 0959  Vitals shown include unvalidated device data.  Last Pain:  Vitals:   07/02/21 0821  TempSrc: Oral  PainSc: 0-No pain      Patients Stated Pain Goal: 8 (54/[id number]/26 [id number])  Complications:  Encounter Notable Events  Notable Event Outcome Phase Comment  Difficult to intubate - expected  Intraprocedure Filed from anesthesia note documentation.

## 2021-07-02 NOTE — Interval H&P Note (Signed)
History and Physical Interval Note:  07/02/2021 8:23 AM  Dennis Mitchell  has presented today for surgery, with the diagnosis of bladder tumor; gross hematuria.  The various methods of treatment have been discussed with the patient and family. After consideration of risks, benefits and other options for treatment, the patient has consented to  Procedure(s): TRANSURETHRAL RESECTION OF BLADDER TUMOR (TURBT) (N/A) CYSTOSCOPY WITH  BILATERAL RETROGRADE PYELOGRAM/URETERAL STENT PLACEMENT (Right) as a surgical intervention.  The patient's history has been reviewed, patient examined, no change in status, stable for surgery.  I have reviewed the patient's chart and labs.  Questions were answered to the patient's satisfaction.     Michaelle Birks

## 2021-07-02 NOTE — Anesthesia Procedure Notes (Signed)
Date/Time: 07/02/2021 9:01 AM  Performed by: Ollen Bowl, CRNA

## 2021-07-02 NOTE — Anesthesia Preprocedure Evaluation (Signed)
Anesthesia Evaluation  Patient identified by MRN, date of birth, ID band Patient awake    Reviewed: Allergy & Precautions, NPO status , Patient's Chart, lab work & pertinent test results  Airway Mallampati: III  TM Distance: >3 FB Neck ROM: Limited   Comment: ACDF Tracheostomy  Dental  (+) Dental Advisory Given, Missing   Pulmonary shortness of breath, with exertion and lying, sleep apnea , pneumonia, resolved, Current Smoker and Patient abstained from smoking.,  H/o tracheostomy   Pulmonary exam normal breath sounds clear to auscultation       Cardiovascular Exercise Tolerance: Poor hypertension, Pt. on medications Normal cardiovascular exam Rhythm:Regular Rate:Normal  ACDF   Neuro/Psych PSYCHIATRIC DISORDERS Anxiety Depression  Neuromuscular disease    GI/Hepatic hiatal hernia, (+)     substance abuse  alcohol use, Gastric bypass   Endo/Other  diabetes, Well Controlled, Type 2  Renal/GU Renal disease  negative genitourinary   Musculoskeletal  (+) Arthritis , Osteoarthritis,    Abdominal   Peds negative pediatric ROS (+)  Hematology negative hematology ROS (+)   Anesthesia Other Findings ACDF BPPV Chronic back pain  Reproductive/Obstetrics negative OB ROS                            Anesthesia Physical Anesthesia Plan  ASA: 3  Anesthesia Plan: General   Post-op Pain Management: Dilaudid IV   Induction: Intravenous  PONV Risk Score and Plan: 3 and Ondansetron and Dexamethasone  Airway Management Planned: Oral ETT and Video Laryngoscope Planned  Additional Equipment:   Intra-op Plan:   Post-operative Plan: Extubation in OR  Informed Consent: I have reviewed the patients History and Physical, chart, labs and discussed the procedure including the risks, benefits and alternatives for the proposed anesthesia with the patient or authorized representative who has indicated  his/her understanding and acceptance.     Dental advisory given  Plan Discussed with: CRNA and Surgeon  Anesthesia Plan Comments: (H/o tracheostomy, use ETT 7.0 or smaller size for intubation)       Anesthesia Quick Evaluation

## 2021-07-02 NOTE — Op Note (Signed)
OPERATIVE NOTE   Patient Name: Dennis Mitchell  MRN: [id number]  Date of Procedure: 07/02/21  Preoperative diagnosis:  Bladder lesion Gross hematuria  Postoperative diagnosis:  Bladder lesion Gross hematuria  Procedure:  Cystoscopy Bilateral retrograde pyelograms with real-time interpretation Transurethral resection of bladder tumor (less than 2 cm) Insertion of right ureteral stent (60F x 26 cm, with tether)  Attending: Primus Bravo, MD  Anesthesia: General  Estimated blood loss: 0 mL  Fluids: Per anesthesia record  Drains: 6 F x 26 cm right ureteral stent with tether  Specimens: Bladder tumor  Antibiotics: Cipro 400 mg IV  Findings: Papillary bladder tumor immediately adjacent to right ureteral orifice  Indications:  71 year old male with history of gross hematuria and recently diagnosed bladder lesion.  He underwent evaluation with renal ultrasound for chronic renal insufficiency and was found to have a possible 1.5 x 1.0 x 1.8 cm nodular mass in the bladder.  Further evaluation with CT imaging in May 2023 demonstrated no renal mass or obstruction, no calculi, simple appearing cyst in the right upper pole, and potential small mass near the right ureteral orifice.  Cystoscopy on 06/19/2021 showed lateral lobe enlargement of the prostate and a papillary lesion immediately adjacent to the right ureteral orifice consistent with urothelial carcinoma.  Urine cytology was negative for malignancy.  He presents now for surgical management with cystoscopy, bilateral retrograde pyelograms, transurethral resection of bladder tumor, and possible insertion of a right ureteral stent.  Risk and benefits of the procedure were discussed in detail.  He understands wishes to proceed as described.  Description of Procedure:  The patient received IV Cipro preoperatively.  After successful induction of a general anesthetic, the patient was placed in the dorsolithotomy position.  The  patient's genital tear was prepped and draped in sterile fashion.  Under direct visualization, a 68 French rigid cystoscope was passed through the urethra and into the bladder.  The patient was noted to have lateral lobe enlargement of the prostate.  Inspection of the bladder demonstrated a papillary lesion immediately adjacent to the right ureteral orifice.  No other lesions were seen throughout the bladder.  Bilateral retrograde pyelograms were performed for evaluation of the patient's upper tracts given his history of gross hematuria and bladder lesion.  Scout film showed a nonspecific bowel gas pattern and no obvious bony abnormalities.  Using a 5 [place] open-ended catheter, contrast was injected into the left ureter.  A normal-appearing collecting system was noted without filling defect or obstruction.  In a similar fashion, contrast was injected into the right ureter.  No obvious obstruction or filling defect was appreciated on the right.  A sensor guidewire was passed through the open-ended catheter and into the right renal pelvis.  The open-ended catheter was left over the guidewire.  The patient's urethra was gently dilated to 30 [place] using [employer].  A 27 French continuous-flow resectoscope sheath was then placed under direct visualization.  Using a bipolar loop, the bladder tumor was completely resected.  Resection was done immediately over intramural component of the right ureter.  Resection was not performed immediately over the orifice.  All visible tumor was resected.  Hemostasis was obtained with the loop with care taken to avoid any fulguration immediately over the orifice.  The resected tissue was irrigated out of the bladder.  At this point, a 6 [place] by 26 cm double-J ureteral stent was then passed over the guidewire and into the right renal pelvis.  Position was confirmed  with fluoroscopy.  A tether was left attached and brought through the meatus.  The bladder was then drained  and the cystoscope was removed.  Intraurethral lidocaine jelly was placed.  The patient was then extubated and taken to the postanesthesia care unit in stable condition.  Complications: None  Condition: Stable, extubated, transferred to PACU  Plan:  Discharge to home Continue right ureteral stent for 10-14 days.

## 2021-07-02 NOTE — Anesthesia Procedure Notes (Signed)
Procedure Name: Intubation Date/Time: 07/02/2021 9:01 AM  Performed by: Ollen Bowl, CRNAPre-anesthesia Checklist: Patient identified, Patient being monitored, Timeout performed, Emergency Drugs available and Suction available Patient Re-evaluated:Patient Re-evaluated prior to induction Oxygen Delivery Method: Circle system utilized Preoxygenation: Pre-oxygenation with 100% oxygen Induction Type: IV induction Ventilation: Mask ventilation without difficulty Laryngoscope Size: Glidescope (S3) Grade View: Grade I Tube type: Oral Tube size: 7.0 mm Number of attempts: 1 Airway Equipment and Method: Rigid stylet Placement Confirmation: ETT inserted through vocal cords under direct vision, positive ETCO2 and breath sounds checked- equal and bilateral Secured at: 22 cm Tube secured with: Tape Dental Injury: Teeth and Oropharynx as per pre-operative assessment  Difficulty Due To: Difficulty was anticipated Comments: Previous tracheostomy.  Planned glidescope.  Easy visualization but a little difficult to pass tip of ETT.  CRNA held [place] and MD manipulated tip to pass through [address].  Once through, slid easily.

## 2021-07-02 NOTE — Anesthesia Postprocedure Evaluation (Signed)
Anesthesia Post Note  Patient: Dennis Mitchell  Procedure(s) Performed: TRANSURETHRAL RESECTION OF BLADDER TUMOR (TURBT) (Bladder) CYSTOSCOPY WITH  BILATERAL RETROGRADE PYELOGRAM/URETERAL STENT PLACEMENT (Right: Ureter)  Patient location during evaluation: Phase II Anesthesia Type: General Level of consciousness: awake and alert and oriented Pain management: pain level controlled Vital Signs Assessment: post-procedure vital signs reviewed and stable Respiratory status: spontaneous breathing, nonlabored ventilation and respiratory function stable Cardiovascular status: blood pressure returned to baseline and stable Postop Assessment: no apparent nausea or vomiting Anesthetic complications: yes   Encounter Notable Events  Notable Event Outcome Phase Comment  Difficult to intubate - expected  Intraprocedure Filed from anesthesia note documentation.     Last Vitals:  Vitals:   07/02/21 1030 07/02/21 1039  BP: (!) 180/72 (!) 167/77  Pulse: 68 73  Resp: 13 16  Temp:  36.4 C  SpO2: 96% 96%    Last Pain:  Vitals:   07/02/21 1039  TempSrc: Axillary  PainSc: 0-No pain                 [place] C Semya Klinke

## 2021-07-03 LAB — SURGICAL PATHOLOGY

## 2021-07-07 ENCOUNTER — Telehealth: Payer: Self-pay

## 2021-07-07 ENCOUNTER — Encounter (HOSPITAL_COMMUNITY): Payer: Self-pay | Admitting: Urology

## 2021-07-07 NOTE — Telephone Encounter (Signed)
Patient called his morning advising he would like a clinical member to call him regarding to the results of his procedure.    Thank you

## 2021-07-11 ENCOUNTER — Ambulatory Visit (INDEPENDENT_AMBULATORY_CARE_PROVIDER_SITE_OTHER): Payer: Medicare HMO | Admitting: Urology

## 2021-07-11 ENCOUNTER — Encounter: Payer: Self-pay | Admitting: Urology

## 2021-07-11 VITALS — BP 174/76 | HR 78

## 2021-07-11 DIAGNOSIS — R300 Dysuria: Secondary | ICD-10-CM

## 2021-07-11 DIAGNOSIS — C670 Malignant neoplasm of trigone of bladder: Secondary | ICD-10-CM

## 2021-07-11 LAB — MICROSCOPIC EXAMINATION
Bacteria, UA: NONE SEEN
Epithelial Cells (non renal): NONE SEEN /hpf (ref 0–10)
Renal Epithel, UA: NONE SEEN /hpf
WBC, UA: NONE SEEN /hpf (ref 0–5)

## 2021-07-11 LAB — URINALYSIS, ROUTINE W REFLEX MICROSCOPIC
Bilirubin, UA: NEGATIVE
Glucose, UA: NEGATIVE
Ketones, UA: NEGATIVE
Leukocytes,UA: NEGATIVE
Nitrite, UA: NEGATIVE
Specific Gravity, UA: 1.015 (ref 1.005–1.030)
Urobilinogen, Ur: 2 mg/dL — ABNORMAL HIGH (ref 0.2–1.0)
pH, UA: 7 (ref 5.0–7.5)
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
## 2021-07-17 ENCOUNTER — Ambulatory Visit: Payer: Medicare HMO | Admitting: Urology

## 2021-08-01 ENCOUNTER — Ambulatory Visit (INDEPENDENT_AMBULATORY_CARE_PROVIDER_SITE_OTHER): Payer: Medicare HMO | Admitting: Urology

## 2021-08-01 ENCOUNTER — Encounter: Payer: Self-pay | Admitting: Urology

## 2021-08-01 VITALS — BP 164/74 | HR 89

## 2021-08-01 DIAGNOSIS — C670 Malignant neoplasm of trigone of bladder: Secondary | ICD-10-CM

## 2021-08-01 LAB — MICROSCOPIC EXAMINATION
Bacteria, UA: NONE SEEN
Epithelial Cells (non renal): NONE SEEN /hpf (ref 0–10)
RBC, Urine: NONE SEEN /hpf (ref 0–2)
Renal Epithel, UA: NONE SEEN /hpf
WBC, UA: NONE SEEN /hpf (ref 0–5)

## 2021-08-01 LAB — URINALYSIS, ROUTINE W REFLEX MICROSCOPIC
Bilirubin, UA: NEGATIVE
Glucose, UA: NEGATIVE
Ketones, UA: NEGATIVE
Leukocytes,UA: NEGATIVE
Nitrite, UA: NEGATIVE
RBC, UA: NEGATIVE
Specific Gravity, UA: 1.015 (ref 1.005–1.030)
Urobilinogen, Ur: 2 mg/dL — ABNORMAL HIGH (ref 0.2–1.0)
pH, UA: 7 (ref 5.0–7.5)
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
## 2021-08-07 ENCOUNTER — Other Ambulatory Visit (HOSPITAL_COMMUNITY)
Admission: RE | Admit: 2021-08-07 | Discharge: 2021-08-07 | Disposition: A | Discharge status: Home / Self Care | Payer: Medicare HMO | Source: Ambulatory Visit | Attending: Nephrology | Admitting: Nephrology

## 2021-08-07 DIAGNOSIS — E1129 Type 2 diabetes mellitus with other diabetic kidney complication: Secondary | ICD-10-CM | POA: Diagnosis not present

## 2021-08-07 DIAGNOSIS — N189 Chronic kidney disease, unspecified: Secondary | ICD-10-CM | POA: Insufficient documentation

## 2021-08-07 DIAGNOSIS — E876 Hypokalemia: Secondary | ICD-10-CM | POA: Diagnosis not present

## 2021-08-07 DIAGNOSIS — R809 Proteinuria, unspecified: Secondary | ICD-10-CM | POA: Diagnosis not present

## 2021-08-07 DIAGNOSIS — E1122 Type 2 diabetes mellitus with diabetic chronic kidney disease: Secondary | ICD-10-CM | POA: Diagnosis not present

## 2021-08-07 DIAGNOSIS — E871 Hypo-osmolality and hyponatremia: Secondary | ICD-10-CM | POA: Diagnosis not present

## 2021-08-07 DIAGNOSIS — N3289 Other specified disorders of bladder: Secondary | ICD-10-CM | POA: Diagnosis not present

## 2021-08-07 DIAGNOSIS — I129 Hypertensive chronic kidney disease with stage 1 through stage 4 chronic kidney disease, or unspecified chronic kidney disease: Secondary | ICD-10-CM | POA: Diagnosis not present

## 2021-08-07 LAB — RENAL FUNCTION PANEL
Albumin: 3.6 g/dL (ref 3.5–5.0)
Anion gap: 9 (ref 5–15)
BUN: 32 mg/dL — ABNORMAL HIGH (ref 8–23)
CO2: 29 mmol/L (ref 22–32)
Calcium: 8.8 mg/dL — ABNORMAL LOW (ref 8.9–10.3)
Chloride: 95 mmol/L — ABNORMAL LOW (ref 98–111)
Creatinine, Ser: 1.06 mg/dL (ref 0.61–1.24)
GFR, Estimated: >60 mL/min (ref >60)
Glucose, Bld: 110 mg/dL — ABNORMAL HIGH (ref 70–99)
Phosphorus: 3.4 mg/dL (ref 2.5–4.6)
Potassium: 3.3 mmol/L — ABNORMAL LOW (ref 3.5–5.1)
Sodium: 133 mmol/L — ABNORMAL LOW (ref 135–145)

## 2021-08-07 LAB — CBC
HCT: 41.9 % (ref 39.0–52.0)
Hemoglobin: 14.5 g/dL (ref 13.0–17.0)
MCH: 30.9 pg (ref 26.0–34.0)
MCHC: 34.6 g/dL (ref 30.0–36.0)
MCV: 89.1 fL (ref 80.0–100.0)
Platelets: 199 10*3/uL (ref 150–400)
RBC: 4.7 MIL/uL (ref 4.22–5.81)
RDW: 13.3 % (ref 11.5–15.5)
WBC: 8.2 10*3/uL (ref 4.0–10.5)
nRBC: 0 % (ref 0.0–0.2)

## 2021-08-07 LAB — PROTEIN / CREATININE RATIO, URINE
Creatinine, Urine: 81.1 mg/dL
Protein Creatinine Ratio: 0.23 mg/mg{Cre} — ABNORMAL HIGH (ref 0.00–0.15)
Total Protein, Urine: 19 mg/dL

## 2021-09-18 ENCOUNTER — Encounter (INDEPENDENT_AMBULATORY_CARE_PROVIDER_SITE_OTHER): Payer: Self-pay | Admitting: *Deleted

## 2021-09-19 DIAGNOSIS — I10 Essential (primary) hypertension: Secondary | ICD-10-CM | POA: Diagnosis not present

## 2021-09-19 DIAGNOSIS — Z125 Encounter for screening for malignant neoplasm of prostate: Secondary | ICD-10-CM | POA: Diagnosis not present

## 2021-09-19 DIAGNOSIS — E876 Hypokalemia: Secondary | ICD-10-CM | POA: Diagnosis not present

## 2021-09-19 DIAGNOSIS — E559 Vitamin D deficiency, unspecified: Secondary | ICD-10-CM | POA: Diagnosis not present

## 2021-09-19 DIAGNOSIS — E1165 Type 2 diabetes mellitus with hyperglycemia: Secondary | ICD-10-CM | POA: Diagnosis not present

## 2021-09-26 DIAGNOSIS — R809 Proteinuria, unspecified: Secondary | ICD-10-CM | POA: Diagnosis not present

## 2021-09-26 DIAGNOSIS — I10 Essential (primary) hypertension: Secondary | ICD-10-CM | POA: Diagnosis not present

## 2021-09-26 DIAGNOSIS — E1165 Type 2 diabetes mellitus with hyperglycemia: Secondary | ICD-10-CM | POA: Diagnosis not present

## 2021-09-26 DIAGNOSIS — E559 Vitamin D deficiency, unspecified: Secondary | ICD-10-CM | POA: Diagnosis not present

## 2021-09-26 DIAGNOSIS — E876 Hypokalemia: Secondary | ICD-10-CM | POA: Diagnosis not present

## 2021-09-26 DIAGNOSIS — M545 Low back pain, unspecified: Secondary | ICD-10-CM | POA: Diagnosis not present

## 2021-09-26 DIAGNOSIS — R944 Abnormal results of kidney function studies: Secondary | ICD-10-CM | POA: Diagnosis not present

## 2021-09-26 DIAGNOSIS — E7800 Pure hypercholesterolemia, unspecified: Secondary | ICD-10-CM | POA: Diagnosis not present

## 2021-09-26 DIAGNOSIS — Z23 Encounter for immunization: Secondary | ICD-10-CM | POA: Diagnosis not present

## 2021-10-02 ENCOUNTER — Encounter: Payer: Self-pay | Admitting: Urology

## 2021-10-02 ENCOUNTER — Ambulatory Visit (INDEPENDENT_AMBULATORY_CARE_PROVIDER_SITE_OTHER): Payer: Medicare HMO | Admitting: Urology

## 2021-10-02 VITALS — BP 154/78 | HR 83 | Ht 67.0 in | Wt 212.0 lb

## 2021-10-02 DIAGNOSIS — Z8551 Personal history of malignant neoplasm of bladder: Secondary | ICD-10-CM | POA: Diagnosis not present

## 2021-10-02 DIAGNOSIS — C670 Malignant neoplasm of trigone of bladder: Secondary | ICD-10-CM | POA: Diagnosis not present

## 2021-10-02 MED ORDER — CIPROFLOXACIN HCL 500 MG PO TABS
500.0000 mg | ORAL_TABLET | Freq: Once | ORAL | Status: AC
  Administered 2021-10-02: 500 mg via ORAL
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
## 2021-10-03 LAB — MICROSCOPIC EXAMINATION: Bacteria, UA: NONE SEEN

## 2021-10-03 LAB — URINALYSIS, ROUTINE W REFLEX MICROSCOPIC
Bilirubin, UA: NEGATIVE
Glucose, UA: NEGATIVE
Leukocytes,UA: NEGATIVE
Nitrite, UA: NEGATIVE
RBC, UA: NEGATIVE
Specific Gravity, UA: 1.015 (ref 1.005–1.030)
Urobilinogen, Ur: 4 mg/dL — ABNORMAL HIGH (ref 0.2–1.0)
pH, UA: 7.5 (ref 5.0–7.5)

## 2021-10-10 ENCOUNTER — Encounter: Payer: Self-pay | Admitting: Urology

## 2021-10-14 ENCOUNTER — Encounter: Payer: Self-pay | Admitting: Emergency Medicine

## 2021-10-14 ENCOUNTER — Other Ambulatory Visit: Payer: Self-pay

## 2021-10-14 ENCOUNTER — Ambulatory Visit
Admission: EM | Admit: 2021-10-14 | Discharge: 2021-10-14 | Disposition: A | Discharge status: Home / Self Care | Payer: Medicare HMO | Attending: Nurse Practitioner | Admitting: Nurse Practitioner

## 2021-10-14 DIAGNOSIS — S81812A Laceration without foreign body, left lower leg, initial encounter: Secondary | ICD-10-CM

## 2021-10-14 DIAGNOSIS — S81811A Laceration without foreign body, right lower leg, initial encounter: Secondary | ICD-10-CM

## 2021-10-14 DIAGNOSIS — Z23 Encounter for immunization: Secondary | ICD-10-CM

## 2021-10-14 MED ORDER — TETANUS-DIPHTH-ACELL PERTUSSIS 5-2.5-18.5 LF-MCG/0.5 IM SUSY
0.5000 mL | PREFILLED_SYRINGE | Freq: Once | INTRAMUSCULAR | Status: AC
  Administered 2021-10-14: 0.5 mL via INTRAMUSCULAR

## 2021-10-14 NOTE — ED Notes (Signed)
Site dressed with non-adherent pad and secured with coban. Site management and infection prevention education provided. Pt verbalized understanding.

## 2021-10-14 NOTE — Discharge Instructions (Signed)
4 Dermabond clips were applied to the laceration on your right lower leg along with steri-strips. Your Tdap was updated today.  This is good for 10 years. Keep the dressing in place for 24 hours. Remove the dressing and clean the area with warm water in 24 hours.  When you are at home, may leave the area open to air.  When you are out, recommend keeping the area covered. May apply Neosporin to the lacerations as needed. May take over-the-counter ibuprofen or Tylenol for pain or discomfort. Go to the emergency department if you develop swelling, increased redness that goes into the hand or up the arm, drainage, or if you develop fever, chills, or other concerns. Keep the clips in place for 14  days.  You will follow-up on 10/28/21 for suture removal. Follow-up in 48 hours for a wound recheck.
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
## 2021-10-15 ENCOUNTER — Encounter: Payer: Self-pay | Admitting: Nurse Practitioner

## 2021-10-16 ENCOUNTER — Ambulatory Visit
Admission: EM | Admit: 2021-10-16 | Discharge: 2021-10-16 | Disposition: A | Discharge status: Home / Self Care | Payer: Medicare HMO | Attending: Nurse Practitioner | Admitting: Nurse Practitioner

## 2021-10-16 DIAGNOSIS — L03115 Cellulitis of right lower limb: Secondary | ICD-10-CM

## 2021-10-16 DIAGNOSIS — E1129 Type 2 diabetes mellitus with other diabetic kidney complication: Secondary | ICD-10-CM | POA: Diagnosis not present

## 2021-10-16 DIAGNOSIS — E1122 Type 2 diabetes mellitus with diabetic chronic kidney disease: Secondary | ICD-10-CM | POA: Diagnosis not present

## 2021-10-16 DIAGNOSIS — N3289 Other specified disorders of bladder: Secondary | ICD-10-CM | POA: Diagnosis not present

## 2021-10-16 DIAGNOSIS — R809 Proteinuria, unspecified: Secondary | ICD-10-CM | POA: Diagnosis not present

## 2021-10-16 DIAGNOSIS — I129 Hypertensive chronic kidney disease with stage 1 through stage 4 chronic kidney disease, or unspecified chronic kidney disease: Secondary | ICD-10-CM | POA: Diagnosis not present

## 2021-10-16 DIAGNOSIS — E871 Hypo-osmolality and hyponatremia: Secondary | ICD-10-CM | POA: Diagnosis not present

## 2021-10-16 DIAGNOSIS — E876 Hypokalemia: Secondary | ICD-10-CM | POA: Diagnosis not present

## 2021-10-16 DIAGNOSIS — N189 Chronic kidney disease, unspecified: Secondary | ICD-10-CM | POA: Diagnosis not present

## 2021-10-16 MED ORDER — CLINDAMYCIN HCL 150 MG PO CAPS: 450.0000 mg | ORAL_CAPSULE | Freq: Three times a day (TID) | ORAL | 0 refills | Status: AC

## 2021-10-16 NOTE — ED Triage Notes (Signed)
Pt presents for wound check on right lower leg. Denies pain, drainage, swelling, fever, chills.
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
## 2021-10-16 NOTE — Discharge Instructions (Addendum)
Please start on the clindamycin to treat any infection in the skin on your right leg. You can shower normally, do not pick at the skin closure devices at your legs; the bottom ones will fall off on their own.  Please come back to see [place] around 10/28/21 to have the top strips taken off.  Come back to see [place] sooner if the pain, swelling, and redness in your skin does not improve.

## 2021-10-22 DIAGNOSIS — R809 Proteinuria, unspecified: Secondary | ICD-10-CM | POA: Diagnosis not present

## 2021-10-22 DIAGNOSIS — E1129 Type 2 diabetes mellitus with other diabetic kidney complication: Secondary | ICD-10-CM | POA: Diagnosis not present

## 2021-10-22 DIAGNOSIS — C679 Malignant neoplasm of bladder, unspecified: Secondary | ICD-10-CM | POA: Diagnosis not present

## 2021-10-22 DIAGNOSIS — E871 Hypo-osmolality and hyponatremia: Secondary | ICD-10-CM | POA: Diagnosis not present

## 2021-10-22 DIAGNOSIS — I129 Hypertensive chronic kidney disease with stage 1 through stage 4 chronic kidney disease, or unspecified chronic kidney disease: Secondary | ICD-10-CM | POA: Diagnosis not present

## 2021-10-22 DIAGNOSIS — N189 Chronic kidney disease, unspecified: Secondary | ICD-10-CM | POA: Diagnosis not present

## 2021-10-22 DIAGNOSIS — E1122 Type 2 diabetes mellitus with diabetic chronic kidney disease: Secondary | ICD-10-CM | POA: Diagnosis not present

## 2021-10-28 ENCOUNTER — Ambulatory Visit
Admission: EM | Admit: 2021-10-28 | Discharge: 2021-10-28 | Disposition: A | Discharge status: Home / Self Care | Payer: Medicare HMO | Attending: Nurse Practitioner | Admitting: Nurse Practitioner

## 2021-10-28 DIAGNOSIS — T148XXD Other injury of unspecified body region, subsequent encounter: Secondary | ICD-10-CM | POA: Diagnosis not present

## 2021-10-28 DIAGNOSIS — Z4802 Encounter for removal of sutures: Secondary | ICD-10-CM

## 2021-10-28 MED ORDER — MUPIROCIN 2 % EX OINT: 1.0000 | TOPICAL_OINTMENT | Freq: Two times a day (BID) | CUTANEOUS | 0 refills | Status: DC

## 2021-10-28 NOTE — ED Triage Notes (Signed)
Pt presets for wound check and Dermaclip removal in right lower leg. Reports some tenderness sin the right lower leg.

## 2021-10-28 NOTE — ED Provider Notes (Signed)
[facility]    CSN: [id number] Arrival date & time: 10/28/21  1229      History   Chief Complaint Chief Complaint  Patient presents with   Suture / Staple Removal   Wound Check    HPI Dennis Mitchell is a 71 y.o. male.   The history is provided by the patient.   Patient presents for a wound recheck and removal of derma clips for an a right lower extremity laceration on 10/14/2021.  Patient followed up on 10/16/2021 and was started on clindamycin for prophylactic wound infection.  Today, patient continues to have 4 derma clips in place and 3 Steri-Strips.  He states that he has been cleaning the wound daily.  He denies fever, chills, chest pain, foul-smelling drainage, increased redness or streaking down the leg, or other concerns.  He states that the area to the right lower leg around the wound is tender.  Patient reports he is still taking the clindamycin as he started off taking it incorrectly.  Past Medical History:  Diagnosis Date   Anxiety    Arthritis    Back pain    Depression    Diabetes mellitus    due to weight loss, lost 70 lbs. due to gastric bypass    Dysphagia    Fatigue    History of hiatal hernia    Hypercholesteremia    Hypertension    Neuropathic pain    Skin cancer of face    Sleep apnea    Cpap   Trauma 2010   multiple traumas- neck, back, ribs, collar bones, - resulted in prolonged hosp. & rehab stay , with trach    Patient Active Problem List   Diagnosis Date Noted   Malignant neoplasm of trigone of urinary bladder ([place]); low grade, Ta; s/p TURBT 6/23 10/02/2021   Gross hematuria 05/15/2021   Bladder mass 05/15/2021   Cervical spondylosis with myelopathy and radiculopathy 12/26/2018   Morbid obesity ([place]) 03/19/2015   Erectile dysfunction 02/08/2013   Venous stasis 02/08/2013   Obstructive sleep apnea 07/31/2012   Essential hypertension, benign 07/31/2012   Depression 07/31/2012   Benign paroxysmal positional vertigo 05/24/2012    Chronic back pain 04/06/2012   Insomnia 04/06/2012   Other and unspecified hyperlipidemia 04/06/2012   Hypotension, unspecified 01/01/2011   Allergic reaction 01/01/2011   CAP (community acquired pneumonia) 01/01/2011   ARF (acute renal failure) ([place]) 01/01/2011   DM type 2 (diabetes mellitus, type 2) ([place]) 01/01/2011    Past Surgical History:  Procedure Laterality Date   ANTERIOR CERVICAL DECOMP/DISCECTOMY FUSION N/A 12/26/2018   Procedure: Anterior Cervical Decompression Fusion - Cervical Four - Cervical Five - Cervical Five - Cervical Six - Cervical Six - Cervical Seven;  Surgeon: Earnie Larsson, MD;  Location: [facility];  Service: Neurosurgery;  Laterality: N/A;  Anterior Cervical Decompression Fusion - Cervical Four - Cervical Five - Cervical Five - Cervical Six - Cervical Six - Cervical Seven   BREATH TEK H PYLORI N/A 03/01/2014   Procedure: BREATH TEK H PYLORI;  Surgeon: Alphonsa Overall, MD;  Location: [facility];  Service: General;  Laterality: N/A;   COLONOSCOPY N/A 09/20/2013   Procedure: COLONOSCOPY;  Surgeon: Rogene Houston, MD;  [facility];  Service: Endoscopy;  Laterality: N/A;  [place] Right 07/02/2021   Procedure: CYSTOSCOPY WITH  BILATERAL RETROGRADE PYELOGRAM/URETERAL STENT PLACEMENT;  Surgeon: Primus Bravo., MD;  Location: [facility];  Service: Urology;  Laterality:  Right;   DOG BITE     LAPAROSCOPIC GASTRIC SLEEVE RESECTION WITH HIATAL HERNIA REPAIR N/A 03/19/2015   Procedure: LAPAROSCOPIC GASTRIC SLEEVE RESECTION WITH  HIATAL HERNIA REPAIR;  Surgeon: Alphonsa Overall, MD;  [facility]: [facility];  Service: General;  Laterality: N/A;   TRACHEOSTOMY  2010   TRANSURETHRAL RESECTION OF BLADDER TUMOR N/A 07/02/2021   Procedure: TRANSURETHRAL RESECTION OF BLADDER TUMOR (TURBT);  Surgeon: Primus Bravo., MD;  [facility];  Service: Urology;  Laterality: N/A;   UPPER GI ENDOSCOPY  03/19/2015   Procedure: UPPER GI ENDOSCOPY;  Surgeon:  Alphonsa Overall, MD;  [facility];  Service: General;;       Home Medications    Prior to Admission medications   Medication Sig Start Date End Date Taking? Authorizing Provider  mupirocin ointment (BACTROBAN) 2 % Apply 1 Application topically 2 (two) times daily. 10/28/21  Yes Reggie Welge-Warren, Alda Lea, NP  Acetaminophen (TYLENOL ARTHRITIS PAIN PO) Take 650 mg by mouth every 8 (eight) hours as needed (pain).    [provider]  amLODipine (NORVASC) 5 MG tablet Take 1 tablet (5 mg total) by mouth every morning. 08/29/20   Ailene Ards, NP  Cholecalciferol 125 MCG (5000 UT) TABS Take 5,000 Units by mouth daily.    [provider]  citalopram (CELEXA) 40 MG tablet Take 1 tablet (40 mg total) by mouth daily. 08/29/20   Ailene Ards, NP  cyclobenzaprine (FLEXERIL) 10 MG tablet Take 1 tablet (10 mg total) by mouth 3 (three) times daily as needed for muscle spasms. 08/29/20   Ailene Ards, NP  diclofenac sodium (VOLTAREN) 1 % GEL APPLY TOPICALLY TWICE DAILY TO AFFECTED AREA AS NEEDED FOR PAIN Patient taking differently: Apply 2 g topically 2 (two) times daily as needed (pain knee). 02/18/17   Mikey Kirschner, MD  EPINEPHrine 0.3 mg/0.3 mL IJ SOAJ injection Inject 0.3 mg into the muscle as needed for anaphylaxis. 07/03/20   Ailene Ards, NP  hydrochlorothiazide (HYDRODIURIL) 25 MG tablet Take 1 tablet (25 mg total) by mouth daily. 08/29/20   Ailene Ards, NP  hydrOXYzine (VISTARIL) 25 MG capsule Take 1 capsule (25 mg total) by mouth every 8 (eight) hours as needed for itching. 08/29/20   Ailene Ards, NP  lisinopril (ZESTRIL) 2.5 MG tablet Take 2.5 mg by mouth daily.    [provider]  meclizine (ANTIVERT) 25 MG tablet Take 1 tablet (25 mg total) by mouth 3 (three) times daily as needed for dizziness. 08/29/20   Ailene Ards, NP  MELATONIN PO Take 1 tablet by mouth at bedtime.    [provider]  montelukast (SINGULAIR) 10 MG tablet Take 10 mg by mouth at  bedtime as needed (itching).    [provider]  multivitamin-lutein (OCUVITE-LUTEIN) CAPS capsule Take 1 capsule by mouth daily.    [provider]  phenazopyridine (PYRIDIUM) 200 MG tablet Take 1 tablet (200 mg total) by mouth 3 (three) times daily as needed for pain. 07/02/21 07/02/22  Stoneking, Reece Leader., MD  potassium chloride (KLOR-CON) 10 MEQ tablet Take 10 mEq by mouth daily.    [provider]  tadalafil (CIALIS) 20 MG tablet Take 0.5-1 tablets (10-20 mg total) by mouth every other day as needed for erectile dysfunction. 08/29/20   Ailene Ards, NP    Family History Family History  Problem Relation Age of Onset   Hypertension Father    Hypertension Sister    Hypertension Brother  Sleep apnea Neg Hx     Social History Social History   Tobacco Use   Smoking status: Some Days    Years: 40.00    Types: E-cigarettes, Cigarettes   Smokeless tobacco: Never   Tobacco comments:    Using electric cigarettes  Vaping Use   Vaping Use: Every day   Substances: Nicotine  Substance Use Topics   Alcohol use: Yes    Alcohol/week: 8.0 standard drinks of alcohol    Types: 8 Cans of beer per week   Drug use: No     Allergies   Bee venom, Aspirin, Lisinopril, and Penicillins   Review of Systems Review of Systems Per HPI  Physical Exam Triage Vital Signs ED Triage Vitals  Enc Vitals Group     BP 10/28/21 1259 (!) 175/79     Pulse Rate 10/28/21 1259 70     Resp 10/28/21 1259 18     Temp 10/28/21 1259 98.1 F (36.7 C)     Temp Source 10/28/21 1259 Oral     SpO2 10/28/21 1259 98 %     Weight --      Height --      Head Circumference --      Peak Flow --      Pain Score 10/28/21 1257 3     Pain Loc --      Pain Edu? --      Excl. in [place]? --    No data found.  Updated Vital Signs BP (!) 175/79 (BP Location: Right Arm)   Pulse 70   Temp 98.1 F (36.7 C) (Oral)   Resp 18   SpO2 98%   Visual Acuity Right Eye Distance:   Left Eye  Distance:   Bilateral Distance:    Right Eye Near:   Left Eye Near:    Bilateral Near:     Physical Exam Vitals and nursing note reviewed.  Constitutional:      General: He is not in acute distress.    Appearance: Normal appearance.  HENT:     Head: Normocephalic.  Pulmonary:     Effort: Pulmonary effort is normal.  Musculoskeletal:     Cervical back: Normal range of motion.  Skin:    General: Skin is warm and dry.     Comments: There is no oozing, foul-smelling drainage, or warmth to the right lower extremity laceration.  There is moderate dried blood present to the J- shaped wound that has now formed a scab.  4 Derma clips were removed with normal saline and Hibiclens soap.  Patient tolerated well.  Nonadherent gauze with antibiotic ointment and Coban was applied.   Neurological:     General: No focal deficit present.     Mental Status: He is alert and oriented to person, place, and time.  Psychiatric:        Mood and Affect: Mood normal.        Behavior: Behavior normal.      UC Treatments / Results  Labs (all labs ordered are listed, but only abnormal results are displayed) Labs Reviewed - No data to display  EKG   Radiology No results found.  Procedures Procedures (including critical care time)  Medications Ordered in UC Medications - No data to display  Initial Impression / Assessment and Plan / UC Course  I have reviewed the triage vital signs and the nursing notes.  Pertinent labs & imaging results that were available during my care of the patient were reviewed  by me and considered in my medical decision making (see chart for details).  Patient presents for wound recheck and removal of derma clips and Steri-Strips to a laceration to his right lower extremity that he suffered on 10/14/2021.  Patient is in no acute distress, he is hypertensive, but otherwise stable.  For derma clips and 3 Steri-Strips were removed from the J-shaped laceration to the right  lower extremity.  The area was cleansed with normal saline and Hibiclens and a nonadherent dressing with antibiotic ointment was applied.  We will start patient on mupirocin for continued healing.  Discussed with patient to clean the area twice daily and to apply the antibiotic ointment.  Patient was advised to keep the area covered when he is out, but when he is home to leave it open to air.  Patient was given strict indications of when to go to the emergency department versus returning to our clinic for reevaluation.  Patient verbalized understanding, all questions were answered.  Patient is stable for discharge. Final Clinical Impressions(s) / UC Diagnoses   Final diagnoses:  Encounter for re-check of laceration wound  Encounter for removal of sutures     Discharge Instructions      Continue to keep the area clean and dry.  Apply the antibiotic ointment prescribed today twice daily for the next 14 days.  As discussed, if your soreness improves, and the wound is healing appropriately, you can stop the antibiotic at day 10. Continue to monitor for symptoms of fever, chills, redness or streaking that goes up the leg, foul-smelling drainage from the wound, or other concerns, please go to the emergency department immediately. As discussed, if the wound continues to be sore and you noticed that it is not healing properly, please follow-up in this clinic as soon as possible. Follow-up as needed.     ED Prescriptions     Medication Sig Dispense Auth. Provider   mupirocin ointment (BACTROBAN) 2 % Apply 1 Application topically 2 (two) times daily. 30 g Finnleigh Marchetti-Warren, Alda Lea, NP      PDMP not reviewed this encounter.   Tish Men, NP 10/28/21 1322

## 2021-10-28 NOTE — Discharge Instructions (Signed)
Continue to keep the area clean and dry.  Apply the antibiotic ointment prescribed today twice daily for the next 14 days.  As discussed, if your soreness improves, and the wound is healing appropriately, you can stop the antibiotic at day 10. Continue to monitor for symptoms of fever, chills, redness or streaking that goes up the leg, foul-smelling drainage from the wound, or other concerns, please go to the emergency department immediately. As discussed, if the wound continues to be sore and you noticed that it is not healing properly, please follow-up in this clinic as soon as possible. Follow-up as needed.

## 2021-11-16 ENCOUNTER — Ambulatory Visit (HOSPITAL_COMMUNITY)
Admission: EM | Admit: 2021-11-16 | Discharge: 2021-11-16 | Disposition: A | Discharge status: Home / Self Care | Payer: Medicare HMO | Attending: Family Medicine | Admitting: Family Medicine

## 2021-11-16 ENCOUNTER — Ambulatory Visit (INDEPENDENT_AMBULATORY_CARE_PROVIDER_SITE_OTHER): Payer: Medicare HMO

## 2021-11-16 ENCOUNTER — Other Ambulatory Visit: Payer: Self-pay

## 2021-11-16 ENCOUNTER — Encounter (HOSPITAL_COMMUNITY): Payer: Self-pay | Admitting: *Deleted

## 2021-11-16 DIAGNOSIS — M542 Cervicalgia: Secondary | ICD-10-CM

## 2021-11-16 MED ORDER — TRAMADOL HCL 50 MG PO TABS: 50.0000 mg | ORAL_TABLET | Freq: Four times a day (QID) | ORAL | 0 refills | Status: DC | PRN

## 2021-11-16 NOTE — Discharge Instructions (Signed)
The plain x-rays of your cervical spine did not show any acute changes.  Take tramadol 50 mg-- 1 tablet every 6 hours as needed for pain.  This medication can make you sleepy or dizzy  Please follow-up with your primary care and with Dr. Trenton Gammon about this issue

## 2021-11-16 NOTE — ED Triage Notes (Signed)
Pt reports neck pain for last 5 days. Pt has tried ice and OTC for pain with out relief. Pt reports he had a surgery to neck 4 years ago. Pt denies any injury.

## 2021-11-16 NOTE — ED Provider Notes (Signed)
[facility]    CSN: [id number] Arrival date & time: 11/16/21  1344      History   Chief Complaint Chief Complaint  Patient presents with   Torticollis    HPI NECHEMIA CHIAPPETTA is a 71 y.o. male.   HPI Here for midline neck pain that began about 5 days ago.  No overuse or trauma.  No fever or chills or rash.  No bowel or bladder incontinence and no muscle weakness and no radiation of the pain.  No paresthesias or arm numbness.  Has tried Aleve and Tylenol and nothing has helped.  Also his Flexeril is not helping.  Past Medical History:  Diagnosis Date   Anxiety    Arthritis    Back pain    Depression    Diabetes mellitus    due to weight loss, lost 70 lbs. due to gastric bypass    Dysphagia    Fatigue    History of hiatal hernia    Hypercholesteremia    Hypertension    Neuropathic pain    Skin cancer of face    Sleep apnea    Cpap   Trauma 2010   multiple traumas- neck, back, ribs, collar bones, - resulted in prolonged hosp. & rehab stay , with trach    Patient Active Problem List   Diagnosis Date Noted   Malignant neoplasm of trigone of urinary bladder ([place]); low grade, Ta; s/p TURBT 6/23 10/02/2021   Gross hematuria 05/15/2021   Bladder mass 05/15/2021   Cervical spondylosis with myelopathy and radiculopathy 12/26/2018   Morbid obesity ([place]) 03/19/2015   Erectile dysfunction 02/08/2013   Venous stasis 02/08/2013   Obstructive sleep apnea 07/31/2012   Essential hypertension, benign 07/31/2012   Depression 07/31/2012   Benign paroxysmal positional vertigo 05/24/2012   Chronic back pain 04/06/2012   Insomnia 04/06/2012   Other and unspecified hyperlipidemia 04/06/2012   Hypotension, unspecified 01/01/2011   Allergic reaction 01/01/2011   CAP (community acquired pneumonia) 01/01/2011   ARF (acute renal failure) ([place]) 01/01/2011   DM type 2 (diabetes mellitus, type 2) ([place]) 01/01/2011    Past Surgical History:  Procedure Laterality Date    ANTERIOR CERVICAL DECOMP/DISCECTOMY FUSION N/A 12/26/2018   Procedure: Anterior Cervical Decompression Fusion - Cervical Four - Cervical Five - Cervical Five - Cervical Six - Cervical Six - Cervical Seven;  Surgeon: Earnie Larsson, MD;  Location: [facility];  Service: Neurosurgery;  Laterality: N/A;  Anterior Cervical Decompression Fusion - Cervical Four - Cervical Five - Cervical Five - Cervical Six - Cervical Six - Cervical Seven   BREATH TEK H PYLORI N/A 03/01/2014   Procedure: BREATH TEK H PYLORI;  Surgeon: Alphonsa Overall, MD;  Location: [facility];  Service: General;  Laterality: N/A;   COLONOSCOPY N/A 09/20/2013   Procedure: COLONOSCOPY;  Surgeon: Rogene Houston, MD;  [facility];  Service: Endoscopy;  Laterality: N/A;  [place] Right 07/02/2021   Procedure: CYSTOSCOPY WITH  BILATERAL RETROGRADE PYELOGRAM/URETERAL STENT PLACEMENT;  Surgeon: Primus Bravo., MD;  Location[facility];  Service: Urology;  Laterality: Right;   DOG BITE     LAPAROSCOPIC GASTRIC SLEEVE RESECTION WITH HIATAL HERNIA REPAIR N/A 03/19/2015   Procedure: LAPAROSCOPIC GASTRIC SLEEVE RESECTION WITH  HIATAL HERNIA REPAIR;  Surgeon: Alphonsa Overall, MD;  [facility];  Service: General;  Laterality: N/A;   TRACHEOSTOMY  2010   TRANSURETHRAL RESECTION OF BLADDER TUMOR N/A 07/02/2021   Procedure: TRANSURETHRAL  RESECTION OF BLADDER TUMOR (TURBT);  Surgeon: Primus Bravo., MD;  [facility];  Service: Urology;  Laterality: N/A;   UPPER GI ENDOSCOPY  03/19/2015   Procedure: UPPER GI ENDOSCOPY;  Surgeon: Alphonsa Overall, MD;  [facility];  Service: General;;       Home Medications    Prior to Admission medications   Medication Sig Start Date End Date Taking? Authorizing Provider  traMADol (ULTRAM) 50 MG tablet Take 1 tablet (50 mg total) by mouth every 6 (six) hours as needed (pain). 11/16/21  Yes Barrett Henle, MD  Acetaminophen (TYLENOL ARTHRITIS PAIN PO) Take 650 mg  by mouth every 8 (eight) hours as needed (pain).    [provider]  amLODipine (NORVASC) 5 MG tablet Take 1 tablet (5 mg total) by mouth every morning. 08/29/20   Ailene Ards, NP  Cholecalciferol 125 MCG (5000 UT) TABS Take 5,000 Units by mouth daily.    [provider]  citalopram (CELEXA) 40 MG tablet Take 1 tablet (40 mg total) by mouth daily. 08/29/20   Ailene Ards, NP  cyclobenzaprine (FLEXERIL) 10 MG tablet Take 1 tablet (10 mg total) by mouth 3 (three) times daily as needed for muscle spasms. 08/29/20   Ailene Ards, NP  diclofenac sodium (VOLTAREN) 1 % GEL APPLY TOPICALLY TWICE DAILY TO AFFECTED AREA AS NEEDED FOR PAIN Patient taking differently: Apply 2 g topically 2 (two) times daily as needed (pain knee). 02/18/17   Mikey Kirschner, MD  EPINEPHrine 0.3 mg/0.3 mL IJ SOAJ injection Inject 0.3 mg into the muscle as needed for anaphylaxis. 07/03/20   Ailene Ards, NP  hydrochlorothiazide (HYDRODIURIL) 25 MG tablet Take 1 tablet (25 mg total) by mouth daily. 08/29/20   Ailene Ards, NP  hydrOXYzine (VISTARIL) 25 MG capsule Take 1 capsule (25 mg total) by mouth every 8 (eight) hours as needed for itching. 08/29/20   Ailene Ards, NP  lisinopril (ZESTRIL) 2.5 MG tablet Take 2.5 mg by mouth daily.    [provider]  meclizine (ANTIVERT) 25 MG tablet Take 1 tablet (25 mg total) by mouth 3 (three) times daily as needed for dizziness. 08/29/20   Ailene Ards, NP  MELATONIN PO Take 1 tablet by mouth at bedtime.    [provider]  montelukast (SINGULAIR) 10 MG tablet Take 10 mg by mouth at bedtime as needed (itching).    [provider]  multivitamin-lutein (OCUVITE-LUTEIN) CAPS capsule Take 1 capsule by mouth daily.    [provider]  mupirocin ointment (BACTROBAN) 2 % Apply 1 Application topically 2 (two) times daily. 10/28/21   Leath-Warren, Alda Lea, NP  phenazopyridine (PYRIDIUM) 200 MG tablet Take 1 tablet (200 mg total) by mouth 3  (three) times daily as needed for pain. 07/02/21 07/02/22  Stoneking, Reece Leader., MD  potassium chloride (KLOR-CON) 10 MEQ tablet Take 10 mEq by mouth daily.    [provider]  tadalafil (CIALIS) 20 MG tablet Take 0.5-1 tablets (10-20 mg total) by mouth every other day as needed for erectile dysfunction. 08/29/20   Ailene Ards, NP    Family History Family History  Problem Relation Age of Onset   Hypertension Father    Hypertension Sister    Hypertension Brother    Sleep apnea Neg Hx     Social History Social History   Tobacco Use   Smoking status: Some Days    Years: 40.00    Types: E-cigarettes, Cigarettes  Smokeless tobacco: Never   Tobacco comments:    Using electric cigarettes  Vaping Use   Vaping Use: Every day   Substances: Nicotine  Substance Use Topics   Alcohol use: Yes    Alcohol/week: 8.0 standard drinks of alcohol    Types: 8 Cans of beer per week   Drug use: No     Allergies   Bee venom, Aspirin, Lisinopril, and Penicillins   Review of Systems Review of Systems   Physical Exam Triage Vital Signs ED Triage Vitals  Enc Vitals Group     BP 11/16/21 1533 (!) 168/80     Pulse Rate 11/16/21 1533 77     Resp 11/16/21 1533 18     Temp 11/16/21 1533 97.7 F (36.5 C)     Temp src --      SpO2 11/16/21 1533 98 %     Weight --      Height --      Head Circumference --      Peak Flow --      Pain Score 11/16/21 1531 10     Pain Loc --      Pain Edu? --      Excl. in [place]? --    No data found.  Updated Vital Signs BP (!) 168/80   Pulse 77   Temp 97.7 F (36.5 C)   Resp 18   SpO2 98%   Visual Acuity Right Eye Distance:   Left Eye Distance:   Bilateral Distance:    Right Eye Near:   Left Eye Near:    Bilateral Near:     Physical Exam Vitals reviewed.  Constitutional:      General: He is not in acute distress.    Appearance: He is not ill-appearing, toxic-appearing or diaphoretic.  HENT:     Mouth/Throat:     Mouth: Mucous  membranes are moist.  Eyes:     Extraocular Movements: Extraocular movements intact.     Conjunctiva/sclera: Conjunctivae normal.     Pupils: Pupils are equal, round, and reactive to light.  Cardiovascular:     Rate and Rhythm: Normal rate and regular rhythm.     Heart sounds: No murmur heard. Pulmonary:     Effort: Pulmonary effort is normal.     Breath sounds: Normal breath sounds.  Musculoskeletal:     Cervical back: Neck supple. No tenderness.  Lymphadenopathy:     Cervical: No cervical adenopathy.  Skin:    Coloration: Skin is not jaundiced or pale.  Neurological:     Mental Status: He is alert and oriented to person, place, and time.  Psychiatric:        Behavior: Behavior normal.      UC Treatments / Results  Labs (all labs ordered are listed, but only abnormal results are displayed) Labs Reviewed - No data to display  EKG   Radiology DG Cervical Spine 2-3 Views  Result Date: 11/16/2021 CLINICAL DATA:  Five days of midline neck pain EXAM: CERVICAL SPINE - 2-3 VIEW COMPARISON:  Radiograph December 26, 2018 FINDINGS: Six cervical type vertebral bodies visualized on the lateral view. Prior C4-C7 anterior cervical discectomy and fusion. There is no evidence of displaced cervical spine fracture. No prevertebral soft tissue swelling. Alignment is normal. No other significant bone abnormalities are identified. IMPRESSION: Prior C4-C7 anterior cervical discectomy and fusion. No acute displaced fracture or traumatic listhesis. Please note CT C-spine is more sensitive than radiographs in the evaluation for cervical spine fracture. Electronically  Signed   By: Dahlia Bailiff M.D.   On: 11/16/2021 16:54    Procedures Procedures (including critical care time)  Medications Ordered in UC Medications - No data to display  Initial Impression / Assessment and Plan / UC Course  I have reviewed the triage vital signs and the nursing notes.  Pertinent labs & imaging results that were  available during my care of the patient were reviewed by me and considered in my medical decision making (see chart for details).        Plain films of the C-spine show no new changes.  It does show his prior discectomy and fusion changes and hardware.  He has not had any narcotic pain feels on PMP in some time.  I am going to send in tramadol in the short-term as Tylenol and naproxen have not helped.  Also there was some question in his chart about some chronic kidney disease, even though his last EGFR was normal-- therefore I will not send in any NSAIDs Final Clinical Impressions(s) / UC Diagnoses   Final diagnoses:  Neck pain     Discharge Instructions      The plain x-rays of your cervical spine did not show any acute changes.  Take tramadol 50 mg-- 1 tablet every 6 hours as needed for pain.  This medication can make you sleepy or dizzy  Please follow-up with your primary care and with Dr. Trenton Gammon about this issue     ED Prescriptions     Medication Sig Dispense Auth. Provider   traMADol (ULTRAM) 50 MG tablet Take 1 tablet (50 mg total) by mouth every 6 (six) hours as needed (pain). 12 tablet Alexandria Shiflett, Gwenlyn Perking, MD      I have reviewed the PDMP during this encounter.   Barrett Henle, MD 11/16/21 [phone]

## 2021-11-18 DIAGNOSIS — I700 Atherosclerosis of aorta: Secondary | ICD-10-CM | POA: Diagnosis not present

## 2021-11-18 DIAGNOSIS — M542 Cervicalgia: Secondary | ICD-10-CM | POA: Diagnosis not present

## 2021-11-27 DIAGNOSIS — Z6833 Body mass index (BMI) 33.0-33.9, adult: Secondary | ICD-10-CM | POA: Diagnosis not present

## 2021-11-27 DIAGNOSIS — M47812 Spondylosis without myelopathy or radiculopathy, cervical region: Secondary | ICD-10-CM | POA: Diagnosis not present

## 2021-11-27 DIAGNOSIS — M25511 Pain in right shoulder: Secondary | ICD-10-CM | POA: Diagnosis not present

## 2021-12-10 ENCOUNTER — Other Ambulatory Visit (HOSPITAL_COMMUNITY): Payer: Self-pay | Admitting: Student

## 2021-12-10 DIAGNOSIS — M47812 Spondylosis without myelopathy or radiculopathy, cervical region: Secondary | ICD-10-CM

## 2021-12-25 ENCOUNTER — Ambulatory Visit (HOSPITAL_COMMUNITY)
Admission: RE | Admit: 2021-12-25 | Discharge: 2021-12-25 | Disposition: A | Discharge status: Home / Self Care | Payer: Medicare HMO | Source: Ambulatory Visit | Attending: Student | Admitting: Student

## 2021-12-25 DIAGNOSIS — M47812 Spondylosis without myelopathy or radiculopathy, cervical region: Secondary | ICD-10-CM | POA: Insufficient documentation

## 2021-12-31 DIAGNOSIS — M47812 Spondylosis without myelopathy or radiculopathy, cervical region: Secondary | ICD-10-CM | POA: Diagnosis not present

## 2022-01-19 DIAGNOSIS — M47812 Spondylosis without myelopathy or radiculopathy, cervical region: Secondary | ICD-10-CM | POA: Diagnosis not present

## 2022-02-02 DIAGNOSIS — M47812 Spondylosis without myelopathy or radiculopathy, cervical region: Secondary | ICD-10-CM | POA: Diagnosis not present

## 2022-02-19 DIAGNOSIS — M47812 Spondylosis without myelopathy or radiculopathy, cervical region: Secondary | ICD-10-CM | POA: Diagnosis not present

## 2022-03-15 ENCOUNTER — Ambulatory Visit
Admission: EM | Admit: 2022-03-15 | Discharge: 2022-03-15 | Disposition: A | Discharge status: Home / Self Care | Payer: Medicare HMO | Attending: Nurse Practitioner | Admitting: Nurse Practitioner

## 2022-03-15 DIAGNOSIS — B349 Viral infection, unspecified: Secondary | ICD-10-CM | POA: Insufficient documentation

## 2022-03-15 DIAGNOSIS — J029 Acute pharyngitis, unspecified: Secondary | ICD-10-CM | POA: Insufficient documentation

## 2022-03-15 DIAGNOSIS — Z1152 Encounter for screening for COVID-19: Secondary | ICD-10-CM | POA: Diagnosis not present

## 2022-03-15 DIAGNOSIS — K0889 Other specified disorders of teeth and supporting structures: Secondary | ICD-10-CM | POA: Insufficient documentation

## 2022-03-15 LAB — POCT INFLUENZA A/B
Influenza A, POC: NEGATIVE
Influenza B, POC: NEGATIVE

## 2022-03-15 LAB — POCT RAPID STREP A (OFFICE): Rapid Strep A Screen: NEGATIVE

## 2022-03-15 MED ORDER — CLINDAMYCIN HCL 300 MG PO CAPS: 300.0000 mg | ORAL_CAPSULE | Freq: Three times a day (TID) | ORAL | 0 refills | Status: AC

## 2022-03-15 MED ORDER — LIDOCAINE VISCOUS HCL 2 % MT SOLN: 5.0000 mL | Freq: Four times a day (QID) | OROMUCOSAL | 0 refills | Status: DC | PRN

## 2022-03-15 NOTE — ED Triage Notes (Signed)
Pt reports sore throat and left facial swelling x 1 day. Mucinex gives no relief.

## 2022-03-15 NOTE — ED Provider Notes (Signed)
[facility]    CSN: [id number] Arrival date & time: 03/15/22  1205      History   Chief Complaint Chief Complaint  Patient presents with   Sore Throat        Facial Swelling    HPI Dennis Mitchell is a 72 y.o. male.   The history is provided by the patient.   The patient presents for complaints of sore throat, generalized fatigue, and left facial swelling.  Patient states sore throat started over the past several days.  Patient states when he woke up this morning, he had swelling to the left side of his face.  He also complains of headache.  He states that he has pain when he is talking and chewing.  He states that he does have a cavity on the same side where his face is swelling.  Patient also endorses pain with swallowing.  Patient denies fever, chills, ear pain, nasal congestion, runny nose, abdominal pain, nausea, vomiting, or diarrhea.  Patient states he has been taking Mucinex for his symptoms with minimal relief.  Past Medical History:  Diagnosis Date   Anxiety    Arthritis    Back pain    Depression    Diabetes mellitus    due to weight loss, lost 70 lbs. due to gastric bypass    Dysphagia    Fatigue    History of hiatal hernia    Hypercholesteremia    Hypertension    Neuropathic pain    Skin cancer of face    Sleep apnea    Cpap   Trauma 2010   multiple traumas- neck, back, ribs, collar bones, - resulted in prolonged hosp. & rehab stay , with trach    Patient Active Problem List   Diagnosis Date Noted   Malignant neoplasm of trigone of urinary bladder ([place]); low grade, Ta; s/p TURBT 6/23 10/02/2021   Gross hematuria 05/15/2021   Bladder mass 05/15/2021   Cervical spondylosis with myelopathy and radiculopathy 12/26/2018   Morbid obesity ([place]) 03/19/2015   Erectile dysfunction 02/08/2013   Venous stasis 02/08/2013   Obstructive sleep apnea 07/31/2012   Essential hypertension, benign 07/31/2012   Depression 07/31/2012   Benign paroxysmal  positional vertigo 05/24/2012   Chronic back pain 04/06/2012   Insomnia 04/06/2012   Other and unspecified hyperlipidemia 04/06/2012   Hypotension, unspecified 01/01/2011   Allergic reaction 01/01/2011   CAP (community acquired pneumonia) 01/01/2011   ARF (acute renal failure) ([place]) 01/01/2011   DM type 2 (diabetes mellitus, type 2) ([place]) 01/01/2011    Past Surgical History:  Procedure Laterality Date   ANTERIOR CERVICAL DECOMP/DISCECTOMY FUSION N/A 12/26/2018   Procedure: Anterior Cervical Decompression Fusion - Cervical Four - Cervical Five - Cervical Five - Cervical Six - Cervical Six - Cervical Seven;  Surgeon: Earnie Larsson, MD;  Location: [place];  Service: Neurosurgery;  Laterality: N/A;  Anterior Cervical Decompression Fusion - Cervical Four - Cervical Five - Cervical Five - Cervical Six - Cervical Six - Cervical Seven   BREATH TEK H PYLORI N/A 03/01/2014   Procedure: BREATH TEK H PYLORI;  Surgeon: Alphonsa Overall, MD;  Location: [facility];  Service: General;  Laterality: N/A;   COLONOSCOPY N/A 09/20/2013   Procedure: COLONOSCOPY;  Surgeon: Rogene Houston, MD;  [facility]: [facility];  Service: Endoscopy;  Laterality: N/A;  [place] Right 07/02/2021   Procedure: CYSTOSCOPY WITH  BILATERAL RETROGRADE PYELOGRAM/URETERAL STENT PLACEMENT;  Surgeon: Michaelle Birks  J., MD;  Location: [facility];  Service: Urology;  Laterality: Right;   DOG BITE     LAPAROSCOPIC GASTRIC SLEEVE RESECTION WITH HIATAL HERNIA REPAIR N/A 03/19/2015   Procedure: LAPAROSCOPIC GASTRIC SLEEVE RESECTION WITH  HIATAL HERNIA REPAIR;  Surgeon: Alphonsa Overall, MD;  Location: [facility];  Service: General;  Laterality: N/A;   TRACHEOSTOMY  2010   TRANSURETHRAL RESECTION OF BLADDER TUMOR N/A 07/02/2021   Procedure: TRANSURETHRAL RESECTION OF BLADDER TUMOR (TURBT);  Surgeon: Primus Bravo., MD;  Location: [facility];  Service: Urology;  Laterality: N/A;   UPPER GI ENDOSCOPY  03/19/2015   Procedure:  UPPER GI ENDOSCOPY;  Surgeon: Alphonsa Overall, MD;  Location: [facility];  Service: General;;       Home Medications    Prior to Admission medications   Medication Sig Start Date End Date Taking? Authorizing Provider  clindamycin (CLEOCIN) 300 MG capsule Take 1 capsule (300 mg total) by mouth 3 (three) times daily for 7 days. 03/15/22 03/22/22 Yes Aaliya Maultsby-Warren, Alda Lea, NP  lidocaine (XYLOCAINE) 2 % solution Use as directed 5 mLs in the mouth or throat every 6 (six) hours as needed for mouth pain. Gargle and spit 71ms every 6 hours as needed for throat pain. 03/15/22  Yes Kallista Pae-Warren, CAlda Lea NP  Acetaminophen (TYLENOL ARTHRITIS PAIN PO) Take 650 mg by mouth every 8 (eight) hours as needed (pain).    [provider]  amLODipine (NORVASC) 5 MG tablet Take 1 tablet (5 mg total) by mouth every morning. 08/29/20   GAilene Ards NP  Cholecalciferol 125 MCG (5000 UT) TABS Take 5,000 Units by mouth daily.    [provider]  citalopram (CELEXA) 40 MG tablet Take 1 tablet (40 mg total) by mouth daily. 08/29/20   GAilene Ards NP  cyclobenzaprine (FLEXERIL) 10 MG tablet Take 1 tablet (10 mg total) by mouth 3 (three) times daily as needed for muscle spasms. 08/29/20   GAilene Ards NP  diclofenac sodium (VOLTAREN) 1 % GEL APPLY TOPICALLY TWICE DAILY TO AFFECTED AREA AS NEEDED FOR PAIN Patient taking differently: Apply 2 g topically 2 (two) times daily as needed (pain knee). 02/18/17   LMikey Kirschner MD  EPINEPHrine 0.3 mg/0.3 mL IJ SOAJ injection Inject 0.3 mg into the muscle as needed for anaphylaxis. 07/03/20   GAilene Ards NP  hydrochlorothiazide (HYDRODIURIL) 25 MG tablet Take 1 tablet (25 mg total) by mouth daily. 08/29/20   GAilene Ards NP  hydrOXYzine (VISTARIL) 25 MG capsule Take 1 capsule (25 mg total) by mouth every 8 (eight) hours as needed for itching. 08/29/20   GAilene Ards NP  lisinopril (ZESTRIL) 2.5 MG tablet Take 2.5 mg by mouth daily.    [provider]   meclizine (ANTIVERT) 25 MG tablet Take 1 tablet (25 mg total) by mouth 3 (three) times daily as needed for dizziness. 08/29/20   GAilene Ards NP  MELATONIN PO Take 1 tablet by mouth at bedtime.    [provider]  montelukast (SINGULAIR) 10 MG tablet Take 10 mg by mouth at bedtime as needed (itching).    [provider]  multivitamin-lutein (OCUVITE-LUTEIN) CAPS capsule Take 1 capsule by mouth daily.    [provider]  mupirocin ointment (BACTROBAN) 2 % Apply 1 Application topically 2 (two) times daily. 10/28/21   Kaidan Spengler-Warren, CAlda Lea NP  phenazopyridine (PYRIDIUM) 200 MG tablet Take 1 tablet (200 mg total) by mouth 3 (three) times daily as needed for pain.  07/02/21 07/02/22  Stoneking, Reece Leader., MD  potassium chloride (KLOR-CON) 10 MEQ tablet Take 10 mEq by mouth daily.    [provider]  tadalafil (CIALIS) 20 MG tablet Take 0.5-1 tablets (10-20 mg total) by mouth every other day as needed for erectile dysfunction. 08/29/20   Ailene Ards, NP  traMADol (ULTRAM) 50 MG tablet Take 1 tablet (50 mg total) by mouth every 6 (six) hours as needed (pain). 11/16/21   Barrett Henle, MD    Family History Family History  Problem Relation Age of Onset   Hypertension Father    Hypertension Sister    Hypertension Brother    Sleep apnea Neg Hx     Social History Social History   Tobacco Use   Smoking status: Some Days    Years: 40.00    Types: E-cigarettes, Cigarettes   Smokeless tobacco: Never   Tobacco comments:    Using electric cigarettes  Vaping Use   Vaping Use: Every day   Substances: Nicotine  Substance Use Topics   Alcohol use: Yes    Alcohol/week: 8.0 standard drinks of alcohol    Types: 8 Cans of beer per week   Drug use: No     Allergies   Bee venom, Aspirin, Lisinopril, and Penicillins   Review of Systems Review of Systems Per HPI  Physical Exam Triage Vital Signs ED Triage Vitals  Enc Vitals Group     BP 03/15/22  1332 (!) 176/77     Pulse Rate 03/15/22 1332 89     Resp 03/15/22 1332 18     Temp 03/15/22 1332 97.7 F (36.5 C)     Temp Source 03/15/22 1332 Oral     SpO2 03/15/22 1332 96 %     Weight --      Height --      Head Circumference --      Peak Flow --      Pain Score 03/15/22 1334 9     Pain Loc --      Pain Edu? --      Excl. in [place]? --    No data found.  Updated Vital Signs BP (!) 176/77 (BP Location: Right Arm)   Pulse 89   Temp 97.7 F (36.5 C) (Oral)   Resp 18   SpO2 96%   Visual Acuity Right Eye Distance:   Left Eye Distance:   Bilateral Distance:    Right Eye Near:   Left Eye Near:    Bilateral Near:     Physical Exam Vitals and nursing note reviewed.  Constitutional:      General: He is not in acute distress.    Appearance: He is well-developed.  HENT:     Head: Normocephalic.     Right Ear: Tympanic membrane and ear canal normal.     Left Ear: Tympanic membrane and ear canal normal.     Nose: Congestion present. No rhinorrhea.     Mouth/Throat:     Lips: Pink.     Mouth: Mucous membranes are moist.     Tongue: Lesions present.     Pharynx: Pharyngeal swelling and posterior oropharyngeal erythema present. No uvula swelling.     Tonsils: No tonsillar exudate. 1+ on the right. 1+ on the left.     Comments: Flesh-colored induration noted under the tongue at the lingual frenulum. Eyes:     Conjunctiva/sclera: Conjunctivae normal.     Pupils: Pupils are equal, round, and reactive to light.  Cardiovascular:  Rate and Rhythm: Normal rate and regular rhythm.     Heart sounds: Normal heart sounds.  Pulmonary:     Effort: Pulmonary effort is normal. No respiratory distress.     Breath sounds: Normal breath sounds. No stridor. No wheezing, rhonchi or rales.  Abdominal:     General: Bowel sounds are normal.     Palpations: Abdomen is soft.     Tenderness: There is no abdominal tenderness.  Musculoskeletal:     Cervical back: Normal range of motion.   Lymphadenopathy:     Cervical: No cervical adenopathy.  Skin:    General: Skin is warm and dry.  Neurological:     General: No focal deficit present.     Mental Status: He is alert and oriented to person, place, and time.  Psychiatric:        Mood and Affect: Mood normal.        Behavior: Behavior normal.      UC Treatments / Results  Labs (all labs ordered are listed, but only abnormal results are displayed) Labs Reviewed  CULTURE, GROUP A STREP ([place])  SARS CORONAVIRUS 2 (TAT 6-24 HRS)  POCT RAPID STREP A (OFFICE)  POCT INFLUENZA A/B    EKG   Radiology No results found.  Procedures Procedures (including critical care time)  Medications Ordered in UC Medications - No data to display  Initial Impression / Assessment and Plan / UC Course  I have reviewed the triage vital signs and the nursing notes.  Pertinent labs & imaging results that were available during my care of the patient were reviewed by me and considered in my medical decision making (see chart for details).  The patient is well-appearing, he is in no acute distress, he is hypertensive, but vital signs are otherwise stable.  Patient presents for sore throat and facial swelling.  With regard to his sore throat, patient tested negative for strep test, influenza test was also negative.  A throat culture and COVID test are pending at this time.  Symptoms appear to be consistent with a viral illness at this time.  With regard to his facial swelling, symptoms appear to be consistent with a dental abscess.  Patient was prescribed clindamycin 300 mg given his history of allergic reaction to penicillin.  Supportive care recommendations were provided to the patient for both his throat symptoms and dental abscess along with strict follow-up precautions.  Patient was advised to follow-up with his dentist within the next 7 to 10 days, and in this clinic or with his primary care physician if his throat symptoms do not  improve.  Patient was also advised to follow-up with his primary care physician for the abnormal lesion seen under the tongue for further evaluation.  Patient is in agreement with this plan of care and verbalizes understanding.  All questions were answered.  Patient stable for discharge.   Final diagnoses:  Sore throat  Dentalgia  Viral illness  Encounter for screening for COVID-19     Discharge Instructions      The rapid strep test and influenza test are negative.  A throat culture and COVID test have been ordered.  If your pending test results are positive, you will be contacted.  You are candidate to receive molnupiravir if your COVID test is positive. Take medication as prescribed. May take over-the-counter Tylenol as needed for pain, fever, or general discomfort.  For your sore throat: Warm salt water gargles 3-4 times daily as needed for throat pain or  discomfort. Recommend a soft diet to include soup, broth, yogurt, pudding, or Jell-O. Make sure you are drinking plenty of fluids to prevent dehydration.  For your dental pain: Warm salt water gargles 3-4 times daily until symptoms improve. Continue warm compresses to the affected area to help with pain and discomfort. Please follow-up with your dentist within the next 7 to 10 days for an appointment.  For the area under your tongue, please follow-up with your primary care physician if the symptoms do not improve.  Follow-up as needed.     ED Prescriptions     Medication Sig Dispense Auth. Provider   clindamycin (CLEOCIN) 300 MG capsule Take 1 capsule (300 mg total) by mouth 3 (three) times daily for 7 days. 21 capsule Eusevio Schriver-Warren, Alda Lea, NP   lidocaine (XYLOCAINE) 2 % solution Use as directed 5 mLs in the mouth or throat every 6 (six) hours as needed for mouth pain. Gargle and spit 53ms every 6 hours as needed for throat pain. 100 mL Lysander Calixte-Warren, CAlda Lea NP      PDMP not reviewed this encounter.    LTish Men NP 03/15/22 1454

## 2022-03-15 NOTE — Discharge Instructions (Addendum)
The rapid strep test and influenza test are negative.  A throat culture and COVID test have been ordered.  If your pending test results are positive, you will be contacted.  You are candidate to receive molnupiravir if your COVID test is positive. Take medication as prescribed. May take over-the-counter Tylenol as needed for pain, fever, or general discomfort.  For your sore throat: Warm salt water gargles 3-4 times daily as needed for throat pain or discomfort. Recommend a soft diet to include soup, broth, yogurt, pudding, or Jell-O. Make sure you are drinking plenty of fluids to prevent dehydration.  For your dental pain: Warm salt water gargles 3-4 times daily until symptoms improve. Continue warm compresses to the affected area to help with pain and discomfort. Please follow-up with your dentist within the next 7 to 10 days for an appointment.  For the area under your tongue, please follow-up with your primary care physician if the symptoms do not improve.  Follow-up as needed.

## 2022-03-16 LAB — SARS CORONAVIRUS 2 (TAT 6-24 HRS): SARS Coronavirus 2: NEGATIVE

## 2022-03-18 LAB — CULTURE, GROUP A STREP (THRC)

## 2022-03-24 DIAGNOSIS — E1165 Type 2 diabetes mellitus with hyperglycemia: Secondary | ICD-10-CM | POA: Diagnosis not present

## 2022-03-24 DIAGNOSIS — I10 Essential (primary) hypertension: Secondary | ICD-10-CM | POA: Diagnosis not present

## 2022-03-24 DIAGNOSIS — E559 Vitamin D deficiency, unspecified: Secondary | ICD-10-CM | POA: Diagnosis not present

## 2022-03-24 DIAGNOSIS — Z125 Encounter for screening for malignant neoplasm of prostate: Secondary | ICD-10-CM | POA: Diagnosis not present

## 2022-03-31 DIAGNOSIS — Z0000 Encounter for general adult medical examination without abnormal findings: Secondary | ICD-10-CM | POA: Diagnosis not present

## 2022-04-01 ENCOUNTER — Other Ambulatory Visit: Payer: Medicare HMO | Admitting: Urology

## 2022-04-01 DIAGNOSIS — E559 Vitamin D deficiency, unspecified: Secondary | ICD-10-CM | POA: Diagnosis not present

## 2022-04-01 DIAGNOSIS — E1165 Type 2 diabetes mellitus with hyperglycemia: Secondary | ICD-10-CM | POA: Diagnosis not present

## 2022-04-01 DIAGNOSIS — G4700 Insomnia, unspecified: Secondary | ICD-10-CM | POA: Diagnosis not present

## 2022-04-01 DIAGNOSIS — I700 Atherosclerosis of aorta: Secondary | ICD-10-CM | POA: Diagnosis not present

## 2022-04-01 DIAGNOSIS — Z23 Encounter for immunization: Secondary | ICD-10-CM | POA: Diagnosis not present

## 2022-04-01 DIAGNOSIS — M542 Cervicalgia: Secondary | ICD-10-CM | POA: Diagnosis not present

## 2022-04-01 DIAGNOSIS — I10 Essential (primary) hypertension: Secondary | ICD-10-CM | POA: Diagnosis not present

## 2022-04-01 DIAGNOSIS — E7800 Pure hypercholesterolemia, unspecified: Secondary | ICD-10-CM | POA: Diagnosis not present

## 2022-04-02 ENCOUNTER — Encounter (INDEPENDENT_AMBULATORY_CARE_PROVIDER_SITE_OTHER): Payer: Self-pay | Admitting: *Deleted

## 2022-05-13 DIAGNOSIS — R809 Proteinuria, unspecified: Secondary | ICD-10-CM | POA: Diagnosis not present

## 2022-05-13 DIAGNOSIS — N189 Chronic kidney disease, unspecified: Secondary | ICD-10-CM | POA: Diagnosis not present

## 2022-05-13 DIAGNOSIS — E1122 Type 2 diabetes mellitus with diabetic chronic kidney disease: Secondary | ICD-10-CM | POA: Diagnosis not present

## 2022-05-13 DIAGNOSIS — E1129 Type 2 diabetes mellitus with other diabetic kidney complication: Secondary | ICD-10-CM | POA: Diagnosis not present

## 2022-05-13 DIAGNOSIS — J449 Chronic obstructive pulmonary disease, unspecified: Secondary | ICD-10-CM | POA: Diagnosis not present

## 2022-05-15 ENCOUNTER — Ambulatory Visit: Payer: Medicare HMO | Admitting: Urology

## 2022-06-19 ENCOUNTER — Ambulatory Visit: Payer: Medicare HMO | Admitting: Urology

## 2022-06-19 DIAGNOSIS — E871 Hypo-osmolality and hyponatremia: Secondary | ICD-10-CM | POA: Diagnosis not present

## 2022-06-19 DIAGNOSIS — S3991XA Unspecified injury of abdomen, initial encounter: Secondary | ICD-10-CM | POA: Diagnosis not present

## 2022-06-19 DIAGNOSIS — S0990XA Unspecified injury of head, initial encounter: Secondary | ICD-10-CM | POA: Diagnosis not present

## 2022-06-19 DIAGNOSIS — S81811A Laceration without foreign body, right lower leg, initial encounter: Secondary | ICD-10-CM | POA: Diagnosis not present

## 2022-06-19 DIAGNOSIS — I517 Cardiomegaly: Secondary | ICD-10-CM | POA: Diagnosis not present

## 2022-06-19 DIAGNOSIS — I6782 Cerebral ischemia: Secondary | ICD-10-CM | POA: Diagnosis not present

## 2022-06-19 DIAGNOSIS — S42001A Fracture of unspecified part of right clavicle, initial encounter for closed fracture: Secondary | ICD-10-CM | POA: Diagnosis not present

## 2022-06-19 DIAGNOSIS — I7121 Aneurysm of the ascending aorta, without rupture: Secondary | ICD-10-CM | POA: Diagnosis not present

## 2022-06-19 DIAGNOSIS — S299XXA Unspecified injury of thorax, initial encounter: Secondary | ICD-10-CM | POA: Diagnosis not present

## 2022-06-19 DIAGNOSIS — S8991XA Unspecified injury of right lower leg, initial encounter: Secondary | ICD-10-CM | POA: Diagnosis not present

## 2022-06-19 DIAGNOSIS — S199XXA Unspecified injury of neck, initial encounter: Secondary | ICD-10-CM | POA: Diagnosis not present

## 2022-06-19 DIAGNOSIS — M1711 Unilateral primary osteoarthritis, right knee: Secondary | ICD-10-CM | POA: Diagnosis not present

## 2022-06-19 DIAGNOSIS — F1010 Alcohol abuse, uncomplicated: Secondary | ICD-10-CM | POA: Diagnosis not present
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
## 2022-06-20 DIAGNOSIS — E871 Hypo-osmolality and hyponatremia: Secondary | ICD-10-CM | POA: Diagnosis not present

## 2022-06-20 DIAGNOSIS — S42001A Fracture of unspecified part of right clavicle, initial encounter for closed fracture: Secondary | ICD-10-CM | POA: Diagnosis not present

## 2022-06-20 DIAGNOSIS — Z789 Other specified health status: Secondary | ICD-10-CM | POA: Diagnosis not present

## 2022-06-21 DIAGNOSIS — E871 Hypo-osmolality and hyponatremia: Secondary | ICD-10-CM | POA: Diagnosis not present

## 2022-06-21 DIAGNOSIS — Z789 Other specified health status: Secondary | ICD-10-CM | POA: Diagnosis not present

## 2022-06-26 ENCOUNTER — Emergency Department (HOSPITAL_COMMUNITY): Payer: Medicare HMO

## 2022-06-26 ENCOUNTER — Other Ambulatory Visit: Payer: Self-pay

## 2022-06-26 ENCOUNTER — Encounter (HOSPITAL_COMMUNITY): Payer: Self-pay | Admitting: Emergency Medicine

## 2022-06-26 ENCOUNTER — Inpatient Hospital Stay (HOSPITAL_COMMUNITY)
Admission: EM | Admit: 2022-06-26 | Discharge: 2022-06-28 | DRG: 603 | Disposition: A | Discharge status: Home / Self Care | Payer: Medicare HMO | Attending: Family Medicine | Admitting: Family Medicine

## 2022-06-26 DIAGNOSIS — Z79899 Other long term (current) drug therapy: Secondary | ICD-10-CM | POA: Diagnosis not present

## 2022-06-26 DIAGNOSIS — E119 Type 2 diabetes mellitus without complications: Secondary | ICD-10-CM | POA: Diagnosis not present

## 2022-06-26 DIAGNOSIS — Z981 Arthrodesis status: Secondary | ICD-10-CM

## 2022-06-26 DIAGNOSIS — S42001A Fracture of unspecified part of right clavicle, initial encounter for closed fracture: Secondary | ICD-10-CM | POA: Diagnosis not present

## 2022-06-26 DIAGNOSIS — Z880 Allergy status to penicillin: Secondary | ICD-10-CM

## 2022-06-26 DIAGNOSIS — E7800 Pure hypercholesterolemia, unspecified: Secondary | ICD-10-CM | POA: Diagnosis not present

## 2022-06-26 DIAGNOSIS — L03115 Cellulitis of right lower limb: Principal | ICD-10-CM | POA: Diagnosis present

## 2022-06-26 DIAGNOSIS — Z886 Allergy status to analgesic agent status: Secondary | ICD-10-CM | POA: Diagnosis not present

## 2022-06-26 DIAGNOSIS — F17290 Nicotine dependence, other tobacco product, uncomplicated: Secondary | ICD-10-CM | POA: Diagnosis present

## 2022-06-26 DIAGNOSIS — I7123 Aneurysm of the descending thoracic aorta, without rupture: Secondary | ICD-10-CM | POA: Diagnosis present

## 2022-06-26 DIAGNOSIS — E871 Hypo-osmolality and hyponatremia: Secondary | ICD-10-CM | POA: Diagnosis not present

## 2022-06-26 DIAGNOSIS — M25511 Pain in right shoulder: Secondary | ICD-10-CM | POA: Diagnosis present

## 2022-06-26 DIAGNOSIS — S81811A Laceration without foreign body, right lower leg, initial encounter: Secondary | ICD-10-CM | POA: Diagnosis not present

## 2022-06-26 DIAGNOSIS — Z85828 Personal history of other malignant neoplasm of skin: Secondary | ICD-10-CM | POA: Diagnosis not present

## 2022-06-26 DIAGNOSIS — E876 Hypokalemia: Secondary | ICD-10-CM | POA: Diagnosis not present

## 2022-06-26 DIAGNOSIS — H8110 Benign paroxysmal vertigo, unspecified ear: Secondary | ICD-10-CM | POA: Diagnosis present

## 2022-06-26 DIAGNOSIS — S81811S Laceration without foreign body, right lower leg, sequela: Secondary | ICD-10-CM | POA: Diagnosis not present

## 2022-06-26 DIAGNOSIS — Z888 Allergy status to other drugs, medicaments and biological substances status: Secondary | ICD-10-CM

## 2022-06-26 DIAGNOSIS — R9431 Abnormal electrocardiogram [ECG] [EKG]: Secondary | ICD-10-CM | POA: Diagnosis not present

## 2022-06-26 DIAGNOSIS — S42001D Fracture of unspecified part of right clavicle, subsequent encounter for fracture with routine healing: Secondary | ICD-10-CM | POA: Diagnosis not present

## 2022-06-26 DIAGNOSIS — Z91030 Bee allergy status: Secondary | ICD-10-CM

## 2022-06-26 DIAGNOSIS — F17210 Nicotine dependence, cigarettes, uncomplicated: Secondary | ICD-10-CM | POA: Diagnosis present

## 2022-06-26 DIAGNOSIS — F1010 Alcohol abuse, uncomplicated: Secondary | ICD-10-CM | POA: Diagnosis present

## 2022-06-26 DIAGNOSIS — L0390 Cellulitis, unspecified: Secondary | ICD-10-CM | POA: Diagnosis present

## 2022-06-26 DIAGNOSIS — Z9884 Bariatric surgery status: Secondary | ICD-10-CM

## 2022-06-26 DIAGNOSIS — S20211A Contusion of right front wall of thorax, initial encounter: Secondary | ICD-10-CM | POA: Diagnosis present

## 2022-06-26 DIAGNOSIS — Z8249 Family history of ischemic heart disease and other diseases of the circulatory system: Secondary | ICD-10-CM

## 2022-06-26 DIAGNOSIS — Z720 Tobacco use: Secondary | ICD-10-CM | POA: Diagnosis present

## 2022-06-26 DIAGNOSIS — I10 Essential (primary) hypertension: Secondary | ICD-10-CM | POA: Diagnosis present

## 2022-06-26 DIAGNOSIS — R0989 Other specified symptoms and signs involving the circulatory and respiratory systems: Secondary | ICD-10-CM | POA: Diagnosis present

## 2022-06-26 DIAGNOSIS — S42031A Displaced fracture of lateral end of right clavicle, initial encounter for closed fracture: Secondary | ICD-10-CM | POA: Diagnosis not present

## 2022-06-26 LAB — CBC WITH DIFFERENTIAL/PLATELET
Abs Immature Granulocytes: 0.07 10*3/uL (ref 0.00–0.07)
Basophils Absolute: 0 10*3/uL (ref 0.0–0.1)
Basophils Relative: 0 %
Eosinophils Absolute: 0.2 10*3/uL (ref 0.0–0.5)
Eosinophils Relative: 2 %
HCT: 42.6 % (ref 39.0–52.0)
Hemoglobin: 14.5 g/dL (ref 13.0–17.0)
Immature Granulocytes: 1 %
Lymphocytes Relative: 11 %
Lymphs Abs: 1.2 10*3/uL (ref 0.7–4.0)
MCH: 31.1 pg (ref 26.0–34.0)
MCHC: 34 g/dL (ref 30.0–36.0)
MCV: 91.4 fL (ref 80.0–100.0)
Monocytes Absolute: 1 10*3/uL (ref 0.1–1.0)
Monocytes Relative: 9 %
Neutro Abs: 8.3 10*3/uL — ABNORMAL HIGH (ref 1.7–7.7)
Neutrophils Relative %: 77 %
Platelets: 248 10*3/uL (ref 150–400)
RBC: 4.66 MIL/uL (ref 4.22–5.81)
RDW: 13.6 % (ref 11.5–15.5)
WBC: 10.7 10*3/uL — ABNORMAL HIGH (ref 4.0–10.5)
nRBC: 0 % (ref 0.0–0.2)

## 2022-06-26 LAB — COMPREHENSIVE METABOLIC PANEL
ALT: 28 U/L (ref 0–44)
AST: 25 U/L (ref 15–41)
Albumin: 3.8 g/dL (ref 3.5–5.0)
Alkaline Phosphatase: 73 U/L (ref 38–126)
Anion gap: 13 (ref 5–15)
BUN: 27 mg/dL — ABNORMAL HIGH (ref 8–23)
CO2: 28 mmol/L (ref 22–32)
Calcium: 9.2 mg/dL (ref 8.9–10.3)
Chloride: 90 mmol/L — ABNORMAL LOW (ref 98–111)
Creatinine, Ser: 0.96 mg/dL (ref 0.61–1.24)
GFR, Estimated: >60 mL/min (ref >60)
Glucose, Bld: 106 mg/dL — ABNORMAL HIGH (ref 70–99)
Potassium: 3.6 mmol/L (ref 3.5–5.1)
Sodium: 131 mmol/L — ABNORMAL LOW (ref 135–145)
Total Bilirubin: 1.1 mg/dL (ref 0.3–1.2)
Total Protein: 8.2 g/dL — ABNORMAL HIGH (ref 6.5–8.1)

## 2022-06-26 LAB — URINALYSIS, ROUTINE W REFLEX MICROSCOPIC
Bacteria, UA: NONE SEEN
Bilirubin Urine: NEGATIVE
Glucose, UA: NEGATIVE mg/dL
Hgb urine dipstick: NEGATIVE
Ketones, ur: NEGATIVE mg/dL
Leukocytes,Ua: NEGATIVE
Nitrite: NEGATIVE
Protein, ur: 100 mg/dL — AB
Specific Gravity, Urine: 1.012 (ref 1.005–1.030)
pH: 7 (ref 5.0–8.0)

## 2022-06-26 LAB — LACTIC ACID, PLASMA
Lactic Acid, Venous: 1 mmol/L (ref 0.5–1.9)
Lactic Acid, Venous: 1.4 mmol/L (ref 0.5–1.9)

## 2022-06-26 MED ORDER — OXYCODONE-ACETAMINOPHEN 5-325 MG PO TABS
1.0000 | ORAL_TABLET | Freq: Once | ORAL | Status: AC
  Administered 2022-06-26: 1 via ORAL
  Filled 2022-06-26: qty 1

## 2022-06-26 MED ORDER — VANCOMYCIN HCL 2000 MG/400ML IV SOLN
2000.0000 mg | Freq: Once | INTRAVENOUS | Status: AC
  Administered 2022-06-26: 2000 mg via INTRAVENOUS
  Filled 2022-06-26: qty 400

## 2022-06-26 MED ORDER — SODIUM CHLORIDE 0.9 % IV SOLN
1.0000 g | Freq: Once | INTRAVENOUS | Status: AC
  Administered 2022-06-26: 1 g via INTRAVENOUS
  Filled 2022-06-26: qty 10
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
## 2022-06-27 DIAGNOSIS — Z880 Allergy status to penicillin: Secondary | ICD-10-CM | POA: Diagnosis not present

## 2022-06-27 DIAGNOSIS — E871 Hypo-osmolality and hyponatremia: Secondary | ICD-10-CM | POA: Diagnosis not present

## 2022-06-27 DIAGNOSIS — R9431 Abnormal electrocardiogram [ECG] [EKG]: Secondary | ICD-10-CM | POA: Diagnosis present

## 2022-06-27 DIAGNOSIS — L0390 Cellulitis, unspecified: Secondary | ICD-10-CM | POA: Diagnosis present

## 2022-06-27 DIAGNOSIS — E7800 Pure hypercholesterolemia, unspecified: Secondary | ICD-10-CM | POA: Diagnosis present

## 2022-06-27 DIAGNOSIS — F1010 Alcohol abuse, uncomplicated: Secondary | ICD-10-CM | POA: Diagnosis present

## 2022-06-27 DIAGNOSIS — I10 Essential (primary) hypertension: Secondary | ICD-10-CM

## 2022-06-27 DIAGNOSIS — Z886 Allergy status to analgesic agent status: Secondary | ICD-10-CM | POA: Diagnosis not present

## 2022-06-27 DIAGNOSIS — Z888 Allergy status to other drugs, medicaments and biological substances status: Secondary | ICD-10-CM | POA: Diagnosis not present

## 2022-06-27 DIAGNOSIS — Z85828 Personal history of other malignant neoplasm of skin: Secondary | ICD-10-CM | POA: Diagnosis not present

## 2022-06-27 DIAGNOSIS — S20211A Contusion of right front wall of thorax, initial encounter: Secondary | ICD-10-CM | POA: Diagnosis present

## 2022-06-27 DIAGNOSIS — Z720 Tobacco use: Secondary | ICD-10-CM | POA: Diagnosis present

## 2022-06-27 DIAGNOSIS — S42001A Fracture of unspecified part of right clavicle, initial encounter for closed fracture: Secondary | ICD-10-CM | POA: Diagnosis present

## 2022-06-27 DIAGNOSIS — Z8249 Family history of ischemic heart disease and other diseases of the circulatory system: Secondary | ICD-10-CM | POA: Diagnosis not present

## 2022-06-27 DIAGNOSIS — R0989 Other specified symptoms and signs involving the circulatory and respiratory systems: Secondary | ICD-10-CM | POA: Diagnosis present

## 2022-06-27 DIAGNOSIS — E876 Hypokalemia: Secondary | ICD-10-CM | POA: Diagnosis not present

## 2022-06-27 DIAGNOSIS — M25511 Pain in right shoulder: Secondary | ICD-10-CM | POA: Diagnosis present

## 2022-06-27 DIAGNOSIS — F17210 Nicotine dependence, cigarettes, uncomplicated: Secondary | ICD-10-CM | POA: Diagnosis present

## 2022-06-27 DIAGNOSIS — L03115 Cellulitis of right lower limb: Secondary | ICD-10-CM | POA: Diagnosis present

## 2022-06-27 DIAGNOSIS — Z981 Arthrodesis status: Secondary | ICD-10-CM | POA: Diagnosis not present

## 2022-06-27 DIAGNOSIS — Z9884 Bariatric surgery status: Secondary | ICD-10-CM | POA: Diagnosis not present

## 2022-06-27 DIAGNOSIS — H8110 Benign paroxysmal vertigo, unspecified ear: Secondary | ICD-10-CM | POA: Diagnosis present

## 2022-06-27 DIAGNOSIS — Z79899 Other long term (current) drug therapy: Secondary | ICD-10-CM | POA: Diagnosis not present

## 2022-06-27 DIAGNOSIS — F17290 Nicotine dependence, other tobacco product, uncomplicated: Secondary | ICD-10-CM | POA: Diagnosis present

## 2022-06-27 DIAGNOSIS — I7123 Aneurysm of the descending thoracic aorta, without rupture: Secondary | ICD-10-CM | POA: Diagnosis present

## 2022-06-27 DIAGNOSIS — S42001D Fracture of unspecified part of right clavicle, subsequent encounter for fracture with routine healing: Secondary | ICD-10-CM | POA: Diagnosis not present

## 2022-06-27 DIAGNOSIS — E119 Type 2 diabetes mellitus without complications: Secondary | ICD-10-CM | POA: Diagnosis present

## 2022-06-27 DIAGNOSIS — S81811S Laceration without foreign body, right lower leg, sequela: Secondary | ICD-10-CM | POA: Diagnosis not present

## 2022-06-27 LAB — CBC WITH DIFFERENTIAL/PLATELET
Abs Immature Granulocytes: 0.05 10*3/uL (ref 0.00–0.07)
Basophils Absolute: 0.1 10*3/uL (ref 0.0–0.1)
Basophils Relative: 1 %
Eosinophils Absolute: 0.2 10*3/uL (ref 0.0–0.5)
Eosinophils Relative: 2 %
HCT: 37.1 % — ABNORMAL LOW (ref 39.0–52.0)
Hemoglobin: 12.8 g/dL — ABNORMAL LOW (ref 13.0–17.0)
Immature Granulocytes: 1 %
Lymphocytes Relative: 13 %
Lymphs Abs: 1.3 10*3/uL (ref 0.7–4.0)
MCH: 31.4 pg (ref 26.0–34.0)
MCHC: 34.5 g/dL (ref 30.0–36.0)
MCV: 91.2 fL (ref 80.0–100.0)
Monocytes Absolute: 1 10*3/uL (ref 0.1–1.0)
Monocytes Relative: 10 %
Neutro Abs: 7.4 10*3/uL (ref 1.7–7.7)
Neutrophils Relative %: 73 %
Platelets: 226 10*3/uL (ref 150–400)
RBC: 4.07 MIL/uL — ABNORMAL LOW (ref 4.22–5.81)
RDW: 13.5 % (ref 11.5–15.5)
WBC: 10 10*3/uL (ref 4.0–10.5)
nRBC: 0 % (ref 0.0–0.2)

## 2022-06-27 LAB — BASIC METABOLIC PANEL
Anion gap: 9 (ref 5–15)
BUN: 22 mg/dL (ref 8–23)
CO2: 28 mmol/L (ref 22–32)
Calcium: 8.5 mg/dL — ABNORMAL LOW (ref 8.9–10.3)
Chloride: 93 mmol/L — ABNORMAL LOW (ref 98–111)
Creatinine, Ser: 0.87 mg/dL (ref 0.61–1.24)
GFR, Estimated: >60 mL/min (ref >60)
Glucose, Bld: 116 mg/dL — ABNORMAL HIGH (ref 70–99)
Potassium: 3.2 mmol/L — ABNORMAL LOW (ref 3.5–5.1)
Sodium: 130 mmol/L — ABNORMAL LOW (ref 135–145)

## 2022-06-27 LAB — ETHANOL: Alcohol, Ethyl (B): <10 mg/dL (ref <10)

## 2022-06-27 LAB — MAGNESIUM: Magnesium: 1.8 mg/dL (ref 1.7–2.4)

## 2022-06-27 LAB — MRSA NEXT GEN BY PCR, NASAL: MRSA by PCR Next Gen: NOT DETECTED

## 2022-06-27 MED ORDER — VANCOMYCIN HCL 1500 MG/300ML IV SOLN
1500.0000 mg | INTRAVENOUS | Status: DC
  Administered 2022-06-28: 1500 mg via INTRAVENOUS
  Filled 2022-06-27: qty 300

## 2022-06-27 MED ORDER — LISINOPRIL 10 MG PO TABS
10.0000 mg | ORAL_TABLET | Freq: Every day | ORAL | Status: DC
  Administered 2022-06-28: 10 mg via ORAL
  Filled 2022-06-27: qty 1

## 2022-06-27 MED ORDER — MECLIZINE HCL 12.5 MG PO TABS: 12.5000 mg | ORAL_TABLET | Freq: Two times a day (BID) | ORAL | Status: DC | PRN

## 2022-06-27 MED ORDER — THIAMINE HCL 100 MG/ML IJ SOLN
100.0000 mg | Freq: Every day | INTRAMUSCULAR | Status: DC
  Administered 2022-06-27 – 2022-06-28 (×2): 100 mg via INTRAVENOUS
  Filled 2022-06-27 (×2): qty 2

## 2022-06-27 MED ORDER — CITALOPRAM HYDROBROMIDE 20 MG PO TABS
40.0000 mg | ORAL_TABLET | Freq: Every day | ORAL | Status: DC
  Administered 2022-06-27 – 2022-06-28 (×2): 40 mg via ORAL
  Filled 2022-06-27 (×2): qty 2

## 2022-06-27 MED ORDER — HYDROCHLOROTHIAZIDE 25 MG PO TABS
25.0000 mg | ORAL_TABLET | Freq: Every day | ORAL | Status: DC
  Administered 2022-06-27: 25 mg via ORAL
  Filled 2022-06-27: qty 1

## 2022-06-27 MED ORDER — NICOTINE 21 MG/24HR TD PT24
21.0000 mg | MEDICATED_PATCH | Freq: Every day | TRANSDERMAL | Status: DC
  Administered 2022-06-27 – 2022-06-28 (×2): 21 mg via TRANSDERMAL
  Filled 2022-06-27 (×2): qty 1

## 2022-06-27 MED ORDER — SODIUM CHLORIDE 0.9 % IV SOLN
2.0000 g | Freq: Three times a day (TID) | INTRAVENOUS | Status: DC
  Administered 2022-06-27 – 2022-06-28 (×5): 2 g via INTRAVENOUS
  Filled 2022-06-27 (×5): qty 12.5

## 2022-06-27 MED ORDER — MEDIHONEY WOUND/BURN DRESSING EX PSTE
1.0000 | PASTE | Freq: Every day | CUTANEOUS | Status: DC
  Administered 2022-06-27: 1 via TOPICAL
  Filled 2022-06-27: qty 44

## 2022-06-27 MED ORDER — OXYCODONE-ACETAMINOPHEN 5-325 MG PO TABS
1.0000 | ORAL_TABLET | Freq: Four times a day (QID) | ORAL | Status: DC | PRN
  Administered 2022-06-27 (×2): 1 via ORAL
  Filled 2022-06-27 (×2): qty 1

## 2022-06-27 MED ORDER — CYCLOBENZAPRINE HCL 10 MG PO TABS
10.0000 mg | ORAL_TABLET | Freq: Three times a day (TID) | ORAL | Status: DC | PRN
  Administered 2022-06-27: 10 mg via ORAL
  Filled 2022-06-27: qty 1

## 2022-06-27 MED ORDER — LISINOPRIL 5 MG PO TABS
2.5000 mg | ORAL_TABLET | Freq: Every day | ORAL | Status: DC
  Administered 2022-06-27: 2.5 mg via ORAL
  Filled 2022-06-27: qty 1

## 2022-06-27 MED ORDER — AMLODIPINE BESYLATE 5 MG PO TABS
5.0000 mg | ORAL_TABLET | Freq: Every morning | ORAL | Status: DC
  Administered 2022-06-27: 5 mg via ORAL
  Filled 2022-06-27: qty 1

## 2022-06-27 MED ORDER — MAGNESIUM SULFATE 2 GM/50ML IV SOLN
2.0000 g | Freq: Once | INTRAVENOUS | Status: AC
  Administered 2022-06-27: 2 g via INTRAVENOUS
  Filled 2022-06-27: qty 50

## 2022-06-27 MED ORDER — AMLODIPINE BESYLATE 5 MG PO TABS
10.0000 mg | ORAL_TABLET | Freq: Every morning | ORAL | Status: DC
  Administered 2022-06-28: 10 mg via ORAL
  Filled 2022-06-27: qty 2

## 2022-06-27 MED ORDER — HYDRALAZINE HCL 20 MG/ML IJ SOLN: 10.0000 mg | Freq: Four times a day (QID) | INTRAMUSCULAR | Status: DC | PRN

## 2022-06-27 MED ORDER — POTASSIUM CHLORIDE CRYS ER 20 MEQ PO TBCR
40.0000 meq | EXTENDED_RELEASE_TABLET | ORAL | Status: AC
  Administered 2022-06-27 (×2): 40 meq via ORAL
  Filled 2022-06-27 (×2): qty 2

## 2022-06-27 MED ORDER — HEPARIN SODIUM (PORCINE) 5000 UNIT/ML IJ SOLN
5000.0000 [IU] | Freq: Three times a day (TID) | INTRAMUSCULAR | Status: DC
  Administered 2022-06-27 – 2022-06-28 (×4): 5000 [IU] via SUBCUTANEOUS
  Filled 2022-06-27 (×4): qty 1

## 2022-06-27 MED ORDER — CHLORHEXIDINE GLUCONATE CLOTH 2 % EX PADS
6.0000 | MEDICATED_PAD | Freq: Every day | CUTANEOUS | Status: DC
  Administered 2022-06-27: 6 via TOPICAL

## 2022-06-27 NOTE — Assessment & Plan Note (Signed)
We will check orthostatic . Fall precautions.

## 2022-06-27 NOTE — Assessment & Plan Note (Signed)
Pt is not on treatment.  Says when they check his number is always 5.  Glycemic protocol.
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
## 2022-06-27 NOTE — Assessment & Plan Note (Signed)
Pt has right leg cellulitis. We will cont abx therapy.  Pt given rocephin and vancomycin.

## 2022-06-27 NOTE — Assessment & Plan Note (Signed)
CIWA. IV thiamine.  IV ppi.
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
## 2022-06-27 NOTE — Assessment & Plan Note (Signed)
We will continue with the sling.  PRN pain meds.

## 2022-06-27 NOTE — Assessment & Plan Note (Signed)
Vitals:   06/26/22 2047 06/26/22 2117 06/26/22 2300  BP: (!) 159/68 (!) 190/84 (!) 178/80  2/2 to pain. Pt cont on hydrochlorothiazide / lisinopril and amlodipine.

## 2022-06-27 NOTE — Assessment & Plan Note (Addendum)
Will defer to am team for ABI and vascular consult. Currently pt has cellulitis and may not tolerated getting ABI. CTA once stable.

## 2022-06-27 NOTE — Assessment & Plan Note (Addendum)
-  Nicotine patch 

## 2022-06-27 NOTE — Progress Notes (Signed)
Pharmacy Antibiotic Note  Dennis Mitchell is a 72 y.o. male admitted on 06/26/2022 presenting with leg wound, concern for infection.  Pharmacy has been consulted for vancomycin and cefepime dosing.  Plan: Vancomycin 2g IV x 1, then 1500 mg IV q 24h (eAUC 519) Cefepime 2g IV every 8 hours Monitor renal function, Cx and clinical progression to narrow Vancomycin levels as indicated  Height: 5\' 7"  (170.2 cm) Weight: 98.9 kg (218 lb) IBW/kg (Calculated) : 66.1  Temp (24hrs), Avg:97.8 F (36.6 C), Min:97.8 F (36.6 C), Max:97.8 F (36.6 C)  Recent Labs  Lab 06/26/22 2130 06/26/22 2321  WBC 10.7*  --   CREATININE 0.96  --   LATICACIDVEN 1.0 1.4    Estimated Creatinine Clearance: 77.9 mL/min (by C-G formula based on SCr of 0.96 mg/dL).    Allergies  Allergen Reactions   Bee Venom Shortness Of Breath   Aspirin Swelling    Lip swelling   Lisinopril Other (See Comments)    unknown   Penicillins Hives, Other (See Comments) and Rash    .Marland KitchenHas patient had a PCN reaction causing immediate rash, facial/tongue/throat swelling, SOB or lightheadedness with hypotension: Yes -Lips  Has patient had a PCN reaction causing severe rash involving mucus membranes or skin necrosis: No  Has patient had a PCN reaction that required hospitalization No  Has patient had a PCN reaction occurring within the last 10 years: No  If all of the above answers are "NO", then may proceed with Cephalosporin use.  Marland Kitchen.Has patient had a PCN reaction causing immediate rash, facial/tongue/throat swelling, SOB or lightheadedness with hypotension: Yes -Lips, Has patient had a PCN reaction causing severe rash involving mucus membranes or skin necrosis: No, Has patient had a PCN reaction that required hospitalization No, Has patient had a PCN reaction occurring within the last 10 years: No, If all of the above answers are "NO", then may proceed with Cephalosporin use.    Daylene Posey, PharmD, [facility] Clinical  Pharmacist ED Pharmacist Phone # [phone] 06/27/2022 12:32 AM

## 2022-06-28 DIAGNOSIS — S42001A Fracture of unspecified part of right clavicle, initial encounter for closed fracture: Secondary | ICD-10-CM | POA: Diagnosis not present

## 2022-06-28 DIAGNOSIS — F1010 Alcohol abuse, uncomplicated: Secondary | ICD-10-CM

## 2022-06-28 DIAGNOSIS — I10 Essential (primary) hypertension: Secondary | ICD-10-CM | POA: Diagnosis not present

## 2022-06-28 DIAGNOSIS — L03115 Cellulitis of right lower limb: Secondary | ICD-10-CM | POA: Diagnosis not present

## 2022-06-28 LAB — RENAL FUNCTION PANEL
Albumin: 2.8 g/dL — ABNORMAL LOW (ref 3.5–5.0)
Anion gap: 8 (ref 5–15)
BUN: 20 mg/dL (ref 8–23)
CO2: 27 mmol/L (ref 22–32)
Calcium: 8.4 mg/dL — ABNORMAL LOW (ref 8.9–10.3)
Chloride: 94 mmol/L — ABNORMAL LOW (ref 98–111)
Creatinine, Ser: 0.79 mg/dL (ref 0.61–1.24)
GFR, Estimated: >60 mL/min (ref >60)
Glucose, Bld: 97 mg/dL (ref 70–99)
Phosphorus: 3.5 mg/dL (ref 2.5–4.6)
Potassium: 3.5 mmol/L (ref 3.5–5.1)
Sodium: 129 mmol/L — ABNORMAL LOW (ref 135–145)

## 2022-06-28 MED ORDER — AMLODIPINE BESYLATE 10 MG PO TABS: 10.0000 mg | ORAL_TABLET | Freq: Every day | ORAL | 5 refills | Status: AC

## 2022-06-28 MED ORDER — CEPHALEXIN 500 MG PO CAPS: 500.0000 mg | ORAL_CAPSULE | Freq: Three times a day (TID) | ORAL | 0 refills | Status: AC

## 2022-06-28 MED ORDER — MEDIHONEY WOUND/BURN DRESSING EX PSTE: 1.0000 | PASTE | Freq: Every day | CUTANEOUS | 1 refills | Status: DC

## 2022-06-28 MED ORDER — TRAZODONE HCL 50 MG PO TABS: 100.0000 mg | ORAL_TABLET | Freq: Every day | ORAL | Status: DC

## 2022-06-28 MED ORDER — TELMISARTAN 20 MG PO TABS: 20.0000 mg | ORAL_TABLET | Freq: Every day | ORAL | 5 refills | Status: DC

## 2022-06-28 MED ORDER — POTASSIUM CHLORIDE CRYS ER 20 MEQ PO TBCR: 40.0000 meq | EXTENDED_RELEASE_TABLET | Freq: Once | ORAL | Status: DC

## 2022-06-28 MED ORDER — POTASSIUM CHLORIDE ER 10 MEQ PO TBCR: 10.0000 meq | EXTENDED_RELEASE_TABLET | Freq: Every day | ORAL | 2 refills | Status: DC

## 2022-06-28 MED ORDER — DOXYCYCLINE HYCLATE 100 MG PO TABS: 100.0000 mg | ORAL_TABLET | Freq: Two times a day (BID) | ORAL | 0 refills | Status: AC

## 2022-06-28 MED ORDER — NICOTINE 21 MG/24HR TD PT24: 21.0000 mg | MEDICATED_PATCH | Freq: Every day | TRANSDERMAL | 0 refills | Status: DC

## 2022-06-28 MED ORDER — HYDROCHLOROTHIAZIDE 25 MG PO TABS: 12.5000 mg | ORAL_TABLET | Freq: Every day | ORAL | 3 refills | Status: DC

## 2022-06-28 MED ORDER — LISINOPRIL 10 MG PO TABS: 10.0000 mg | ORAL_TABLET | Freq: Every day | ORAL | 5 refills | Status: DC

## 2022-06-28 NOTE — Discharge Instructions (Signed)
1)Repeat CBC and BMP within 1 week 2)Follow up with Orthopedic Surgeon---Dr. Fuller Canada, MD---in 1 week for Right Collar bone recheck -Address: [address] [place], [place] [address] Phone: [phone] 3)Follow up with Benita Stabile, MD for Right Leg wound--- 4) abstinence from alcohol advised
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
## 2022-06-28 NOTE — Progress Notes (Signed)
Nsg Discharge Note  Admit Date:  06/26/2022 Discharge date: 06/28/2022   TRINE ABELLA to be D/C'd Home per MD order.  AVS completed.  Patient/caregiver able to verbalize understanding.  Discharge Medication: Allergies as of 06/28/2022       Reactions   Bee Venom Shortness Of Breath   Aspirin Swelling   Lip swelling   Lisinopril Other (See Comments)   unknown   Penicillins Hives, Other (See Comments), Rash   .Marland KitchenHas patient had a PCN reaction causing immediate rash, facial/tongue/throat swelling, SOB or lightheadedness with hypotension: Yes -Lips Has patient had a PCN reaction causing severe rash involving mucus membranes or skin necrosis: No Has patient had a PCN reaction that required hospitalization No Has patient had a PCN reaction occurring within the last 10 years: No If all of the above answers are "NO", then may proceed with Cephalosporin use. Marland Kitchen.Has patient had a PCN reaction causing immediate rash, facial/tongue/throat swelling, SOB or lightheadedness with hypotension: Yes -Lips, Has patient had a PCN reaction causing severe rash involving mucus membranes or skin necrosis: No, Has patient had a PCN reaction that required hospitalization No, Has patient had a PCN reaction occurring within the last 10 years: No, If all of the above answers are "NO", then may proceed with Cephalosporin use.        Medication List     STOP taking these medications    lidocaine 2 % solution Commonly known as: XYLOCAINE   oxyCODONE 5 MG immediate release tablet Commonly known as: Oxy IR/ROXICODONE   potassium chloride 10 MEQ tablet Commonly known as: KLOR-CON M       TAKE these medications    amLODipine 10 MG tablet Commonly known as: NORVASC Take 1 tablet (10 mg total) by mouth daily. What changed:  medication strength how much to take when to take this   cephALEXin 500 MG capsule Commonly known as: Keflex Take 1 capsule (500 mg total) by mouth 3 (three) times daily for 7  days.   Cholecalciferol 125 MCG (5000 UT) Tabs Take 5,000 Units by mouth daily.   citalopram 40 MG tablet Commonly known as: CELEXA Take 1 tablet (40 mg total) by mouth daily.   cyclobenzaprine 10 MG tablet Commonly known as: FLEXERIL Take 1 tablet (10 mg total) by mouth 3 (three) times daily as needed for muscle spasms.   diclofenac sodium 1 % Gel Commonly known as: Voltaren APPLY TOPICALLY TWICE DAILY TO AFFECTED AREA AS NEEDED FOR PAIN What changed:  how much to take how to take this when to take this reasons to take this additional instructions   doxycycline 100 MG tablet Commonly known as: VIBRA-TABS Take 1 tablet (100 mg total) by mouth 2 (two) times daily for 7 days.   EPINEPHrine 0.3 mg/0.3 mL Soaj injection Commonly known as: EPI-PEN Inject 0.3 mg into the muscle as needed for anaphylaxis.   hydrochlorothiazide 25 MG tablet Commonly known as: HYDRODIURIL Take 0.5 tablets (12.5 mg total) by mouth daily. What changed: how much to take   hydrOXYzine 25 MG capsule Commonly known as: VISTARIL Take 1 capsule (25 mg total) by mouth every 8 (eight) hours as needed for itching.   leptospermum manuka honey Pste paste Apply 1 Application topically daily. Apply to right leg wound for dressing changes Start taking on: June 29, 2022   lisinopril 10 MG tablet Commonly known as: ZESTRIL Take 1 tablet (10 mg total) by mouth daily. What changed:  medication strength how much to take  meclizine 25 MG tablet Commonly known as: ANTIVERT Take 1 tablet (25 mg total) by mouth 3 (three) times daily as needed for dizziness.   montelukast 10 MG tablet Commonly known as: SINGULAIR Take 10 mg by mouth at bedtime as needed (itching).   multivitamin-lutein Caps capsule Take 1 capsule by mouth daily.   nicotine 21 mg/24hr patch Commonly known as: NICODERM CQ - dosed in mg/24 hours Place 1 patch (21 mg total) onto the skin daily. Start taking on: June 29, 2022   potassium  chloride 10 MEQ tablet Commonly known as: KLOR-CON Take 1 tablet (10 mEq total) by mouth daily. Take While taking hydrochlorothiazide/HCTZ   sildenafil 100 MG tablet Commonly known as: VIAGRA Take 100 mg by mouth daily as needed.   telmisartan 20 MG tablet Commonly known as: MICARDIS Take 1 tablet (20 mg total) by mouth daily. What changed:  how much to take when to take this   traZODone 50 MG tablet Commonly known as: DESYREL Take 1 tablet by mouth at bedtime.   TYLENOL ARTHRITIS PAIN PO Take 650 mg by mouth every 8 (eight) hours as needed (pain).               Discharge Care Instructions  (From admission, onward)           Start     Ordered   06/28/22 0000  Discharge wound care:       Comments: Honey and non-adhesive dressing daily   06/28/22 1108            Discharge Assessment: Vitals:   06/27/22 2345 06/28/22 0349  BP: (!) 191/73 (!) 176/61  Pulse: 68 65  Resp: 20 20  Temp: (!) 97.5 F (36.4 C) 98.3 F (36.8 C)  SpO2: 100% 97%   Skin clean, dry and intact without evidence of skin break down, no evidence of skin tears noted. IV catheter discontinued intact. Site without signs and symptoms of complications - no redness or edema noted at insertion site, patient denies c/o pain - only slight tenderness at site.  Dressing with slight pressure applied.  D/c Instructions-Education: Discharge instructions given to patient/family with verbalized understanding. D/c education completed with patient/family including follow up instructions, medication list, d/c activities limitations if indicated, with other d/c instructions as indicated by MD - patient able to verbalize understanding, all questions fully answered. Patient instructed to return to ED, call 911, or call MD for any changes in condition.  Patient escorted via WC, and D/C home via private auto.  Laurena Spies, RN 06/28/2022 12:16 PM

## 2022-06-30 DIAGNOSIS — H8113 Benign paroxysmal vertigo, bilateral: Secondary | ICD-10-CM | POA: Diagnosis not present

## 2022-06-30 DIAGNOSIS — S42001D Fracture of unspecified part of right clavicle, subsequent encounter for fracture with routine healing: Secondary | ICD-10-CM | POA: Diagnosis not present

## 2022-06-30 DIAGNOSIS — F17210 Nicotine dependence, cigarettes, uncomplicated: Secondary | ICD-10-CM | POA: Diagnosis not present

## 2022-06-30 DIAGNOSIS — F1010 Alcohol abuse, uncomplicated: Secondary | ICD-10-CM | POA: Diagnosis not present

## 2022-06-30 DIAGNOSIS — L03115 Cellulitis of right lower limb: Secondary | ICD-10-CM | POA: Diagnosis not present

## 2022-06-30 DIAGNOSIS — I7123 Aneurysm of the descending thoracic aorta, without rupture: Secondary | ICD-10-CM | POA: Diagnosis not present

## 2022-06-30 DIAGNOSIS — I10 Essential (primary) hypertension: Secondary | ICD-10-CM | POA: Diagnosis not present

## 2022-06-30 DIAGNOSIS — E119 Type 2 diabetes mellitus without complications: Secondary | ICD-10-CM | POA: Diagnosis not present

## 2022-07-01 LAB — CULTURE, BLOOD (ROUTINE X 2)
Culture: NO GROWTH
Culture: NO GROWTH
Special Requests: ADEQUATE
Special Requests: ADEQUATE

## 2022-07-07 ENCOUNTER — Other Ambulatory Visit (HOSPITAL_COMMUNITY): Payer: Self-pay | Admitting: Family Medicine

## 2022-07-07 DIAGNOSIS — I7123 Aneurysm of the descending thoracic aorta, without rupture: Secondary | ICD-10-CM

## 2022-08-04 DIAGNOSIS — I7123 Aneurysm of the descending thoracic aorta, without rupture: Secondary | ICD-10-CM | POA: Diagnosis not present

## 2022-08-04 DIAGNOSIS — Z1211 Encounter for screening for malignant neoplasm of colon: Secondary | ICD-10-CM | POA: Diagnosis not present

## 2022-08-04 DIAGNOSIS — L97909 Non-pressure chronic ulcer of unspecified part of unspecified lower leg with unspecified severity: Secondary | ICD-10-CM | POA: Diagnosis not present

## 2022-08-05 ENCOUNTER — Encounter (INDEPENDENT_AMBULATORY_CARE_PROVIDER_SITE_OTHER): Payer: Self-pay | Admitting: *Deleted

## 2022-08-12 ENCOUNTER — Other Ambulatory Visit: Payer: Medicare HMO | Admitting: Urology

## 2022-08-12 DIAGNOSIS — C670 Malignant neoplasm of trigone of bladder: Secondary | ICD-10-CM

## 2022-08-27 ENCOUNTER — Ambulatory Visit (HOSPITAL_COMMUNITY)
Admission: RE | Admit: 2022-08-27 | Discharge: 2022-08-27 | Disposition: A | Discharge status: Home / Self Care | Payer: Medicare HMO | Source: Ambulatory Visit | Attending: Family Medicine | Admitting: Family Medicine

## 2022-08-27 DIAGNOSIS — I7121 Aneurysm of the ascending aorta, without rupture: Secondary | ICD-10-CM | POA: Diagnosis not present

## 2022-08-27 DIAGNOSIS — I700 Atherosclerosis of aorta: Secondary | ICD-10-CM | POA: Diagnosis not present

## 2022-08-27 DIAGNOSIS — I7123 Aneurysm of the descending thoracic aorta, without rupture: Secondary | ICD-10-CM | POA: Insufficient documentation

## 2022-08-27 DIAGNOSIS — S2243XA Multiple fractures of ribs, bilateral, initial encounter for closed fracture: Secondary | ICD-10-CM | POA: Diagnosis not present

## 2022-08-27 DIAGNOSIS — I2510 Atherosclerotic heart disease of native coronary artery without angina pectoris: Secondary | ICD-10-CM | POA: Diagnosis not present

## 2022-08-27 MED ORDER — IOHEXOL 350 MG/ML SOLN
100.0000 mL | Freq: Once | INTRAVENOUS | Status: AC | PRN
  Administered 2022-08-27: 100 mL via INTRAVENOUS

## 2022-09-17 ENCOUNTER — Encounter (INDEPENDENT_AMBULATORY_CARE_PROVIDER_SITE_OTHER): Payer: Self-pay | Admitting: *Deleted

## 2022-09-22 ENCOUNTER — Encounter: Payer: Self-pay | Admitting: Internal Medicine

## 2022-09-22 ENCOUNTER — Other Ambulatory Visit: Payer: Medicare HMO

## 2022-09-22 ENCOUNTER — Other Ambulatory Visit: Payer: Self-pay | Admitting: Internal Medicine

## 2022-09-22 ENCOUNTER — Ambulatory Visit: Payer: Medicare HMO | Attending: Internal Medicine | Admitting: Internal Medicine

## 2022-09-22 VITALS — BP 159/75 | HR 70 | Ht 61.0 in | Wt 202.2 lb

## 2022-09-22 DIAGNOSIS — I719 Aortic aneurysm of unspecified site, without rupture: Secondary | ICD-10-CM | POA: Diagnosis not present

## 2022-09-22 DIAGNOSIS — R42 Dizziness and giddiness: Secondary | ICD-10-CM

## 2022-09-22 DIAGNOSIS — Z136 Encounter for screening for cardiovascular disorders: Secondary | ICD-10-CM

## 2022-09-22 DIAGNOSIS — I7121 Aneurysm of the ascending aorta, without rupture: Secondary | ICD-10-CM | POA: Insufficient documentation

## 2022-09-22 MED ORDER — LISINOPRIL 20 MG PO TABS: 20.0000 mg | ORAL_TABLET | Freq: Every day | ORAL | 2 refills | Status: AC

## 2022-09-22 NOTE — Patient Instructions (Addendum)
Medication Instructions:  Your physician has recommended you make the following change in your medication:  Stop taking Telmisartan  Increase Lisinopril from 10 mg to 20 mg once daily Continue taking all other medications as prescribed   Labwork: None  Testing/Procedures: Your physician has requested that you have an echocardiogram. Echocardiography is a painless test that uses sound waves to create images of your heart. It provides your doctor with information about the size and shape of your heart and how well your heart's chambers and valves are working. This procedure takes approximately one hour. There are no restrictions for this procedure. Please do NOT wear cologne, perfume, aftershave, or lotions (deodorant is allowed). Please arrive 15 minutes prior to your appointment time.  Your physician has requested that you have a carotid duplex. This test is an ultrasound of the carotid arteries in your neck. It looks at blood flow through these arteries that supply the brain with blood. Allow one hour for this exam. There are no restrictions or special instructions.  Your physician has recommended that you wear a Zio monitor.   This monitor is a medical device that records the heart's electrical activity. Doctors most often use these monitors to diagnose arrhythmias. Arrhythmias are problems with the speed or rhythm of the heartbeat. The monitor is a small device applied to your chest. You can wear one while you do your normal daily activities. While wearing this monitor if you have any symptoms to push the button and record what you felt. Once you have worn this monitor for the period of time provider prescribed (for 14 days), you will return the monitor device in the postage paid box. Once it is returned they will download the data collected and provide [place] with a report which the provider will then review and we will call you with those results. Important tips:  Avoid showering during the first  48 hours of wearing the monitor. Avoid excessive sweating to help maximize wear time. Do not submerge the device, no hot tubs, and no swimming pools. Keep any lotions or oils away from the patch. After 48 hours you may shower with the patch on. Take brief showers with your back facing the shower head.  Do not remove patch once it has been placed because that will interrupt data and decrease adhesive wear time. Push the button when you have any symptoms and write down what you were feeling. Once you have completed wearing your monitor, remove and place into box which has postage paid and place in your outgoing mailbox.  If for some reason you have misplaced your box then call our office and we can provide another box and/or mail it off for you.  Your physician has requested that you have an abdominal aorta duplex. During this test, an ultrasound is used to evaluate the aorta. Allow 30 minutes for this exam. Do not eat after midnight the day before and avoid carbonated beverages  CTA Chest Aorta  Follow-Up: Your physician recommends that you schedule a follow-up appointment in: 1 year. You will receive a reminder call in about months reminding you to schedule your appointment. If you don't receive this call, please contact our office.   Any Other Special Instructions Will Be Listed Below (If Applicable).  If you need a refill on your cardiac medications before your next appointment, please call your pharmacy.

## 2022-09-22 NOTE — Progress Notes (Signed)
Cardiology Office Note  Date: 09/22/2022   ID: Dennis Mitchell, DOB [birth year], MRN [id number]  PCP:  Benita Stabile, MD  Cardiologist:  Marjo Bicker, MD Electrophysiologist:  None   History of Present Illness: Dennis Mitchell is a 72 y.o. male known to have HTN, DM 2, ascending aorta aneurysm 4.2 cm is here for follow-up visit.  Ongoing dizziness x 3 months, only with exertion and not with rest.  Associated with unsteady gait.  No syncope.  No angina, DOE, orthopnea, PND.  Has mild SOB but no overt SOB symptoms.  Reported right arm weakness probably from recent injury to his right collarbone.  Ongoing current smoker, used to smoke 3 packs/day and currently smoking 1 pack/day.  He reported that he fell from 18 feet height around 10 to 20 years ago and he recently also injured his right collarbone.  Past Medical History:  Diagnosis Date   Anxiety    Arthritis    Back pain    Depression    Diabetes mellitus    due to weight loss, lost 70 lbs. due to gastric bypass    Dysphagia    Fatigue    History of hiatal hernia    Hypercholesteremia    Hypertension    Neuropathic pain    Skin cancer of face    Sleep apnea    Cpap   Trauma 2010   multiple traumas- neck, back, ribs, collar bones, - resulted in prolonged hosp. & rehab stay , with trach    Past Surgical History:  Procedure Laterality Date   ANTERIOR CERVICAL DECOMP/DISCECTOMY FUSION N/A 12/26/2018   Procedure: Anterior Cervical Decompression Fusion - Cervical Four - Cervical Five - Cervical Five - Cervical Six - Cervical Six - Cervical Seven;  Surgeon: Julio Sicks, MD;  Location: [facility];  Service: Neurosurgery;  Laterality: N/A;  Anterior Cervical Decompression Fusion - Cervical Four - Cervical Five - Cervical Five - Cervical Six - Cervical Six - Cervical Seven   BREATH TEK H PYLORI N/A 03/01/2014   Procedure: BREATH TEK H PYLORI;  Surgeon: Ovidio Kin, MD;  Location: [facility];  Service: General;  Laterality: N/A;    COLONOSCOPY N/A 09/20/2013   Procedure: COLONOSCOPY;  Surgeon: Malissa Hippo, MD;  Location: [facility];  Service: Endoscopy;  Laterality: N/A;  930   CYSTOSCOPY W/ URETERAL STENT PLACEMENT Right 07/02/2021   Procedure: CYSTOSCOPY WITH  BILATERAL RETROGRADE PYELOGRAM/URETERAL STENT PLACEMENT;  Surgeon: Milderd Meager., MD;  Location: [facility];  Service: Urology;  Laterality: Right;   DOG BITE     LAPAROSCOPIC GASTRIC SLEEVE RESECTION WITH HIATAL HERNIA REPAIR N/A 03/19/2015   Procedure: LAPAROSCOPIC GASTRIC SLEEVE RESECTION WITH  HIATAL HERNIA REPAIR;  Surgeon: Ovidio Kin, MD;  Location: [facility];  Service: General;  Laterality: N/A;   TRACHEOSTOMY  2010   TRANSURETHRAL RESECTION OF BLADDER TUMOR N/A 07/02/2021   Procedure: TRANSURETHRAL RESECTION OF BLADDER TUMOR (TURBT);  Surgeon: Milderd Meager., MD;  Location: [facility];  Service: Urology;  Laterality: N/A;   UPPER GI ENDOSCOPY  03/19/2015   Procedure: UPPER GI ENDOSCOPY;  Surgeon: Ovidio Kin, MD;  Location: [facility];  Service: General;;    Current Outpatient Medications  Medication Sig Dispense Refill   Acetaminophen (TYLENOL ARTHRITIS PAIN PO) Take 650 mg by mouth every 8 (eight) hours as needed (pain).     amLODipine (NORVASC) 10 MG tablet Take 1 tablet (10 mg total) by mouth daily. 30 tablet 5  Cholecalciferol 125 MCG (5000 UT) TABS Take 5,000 Units by mouth daily.     citalopram (CELEXA) 40 MG tablet Take 1 tablet (40 mg total) by mouth daily. 90 tablet 0   cyclobenzaprine (FLEXERIL) 10 MG tablet Take 1 tablet (10 mg total) by mouth 3 (three) times daily as needed for muscle spasms. 30 tablet 2   diclofenac sodium (VOLTAREN) 1 % GEL APPLY TOPICALLY TWICE DAILY TO AFFECTED AREA AS NEEDED FOR PAIN (Patient taking differently: Apply 2 g topically 2 (two) times daily as needed (pain knee).) 100 g 11   EPINEPHrine 0.3 mg/0.3 mL IJ SOAJ injection Inject 0.3 mg into the muscle as needed for anaphylaxis. 1 each 0   hydrochlorothiazide  (HYDRODIURIL) 25 MG tablet Take 0.5 tablets (12.5 mg total) by mouth daily. 45 tablet 3   hydrOXYzine (VISTARIL) 25 MG capsule Take 1 capsule (25 mg total) by mouth every 8 (eight) hours as needed for itching. 90 capsule 2   leptospermum manuka honey (MEDIHONEY) PSTE paste Apply 1 Application topically daily. Apply to right leg wound for dressing changes 44 mL 1   meclizine (ANTIVERT) 25 MG tablet Take 1 tablet (25 mg total) by mouth 3 (three) times daily as needed for dizziness. 90 tablet 1   montelukast (SINGULAIR) 10 MG tablet Take 10 mg by mouth at bedtime as needed (itching).     multivitamin-lutein (OCUVITE-LUTEIN) CAPS capsule Take 1 capsule by mouth daily.     potassium chloride (KLOR-CON) 10 MEQ tablet Take 1 tablet (10 mEq total) by mouth daily. Take While taking hydrochlorothiazide/HCTZ 30 tablet 2   sildenafil (VIAGRA) 100 MG tablet Take 100 mg by mouth daily as needed.     lisinopril (ZESTRIL) 20 MG tablet Take 1 tablet (20 mg total) by mouth daily. 90 tablet 2   No current facility-administered medications for this visit.   Allergies:  Bee venom, Aspirin, Lisinopril, and Penicillins   Social History: The patient  reports that he has been smoking e-cigarettes and cigarettes. He has never used smokeless tobacco. He reports current alcohol use of about 8.0 standard drinks of alcohol per week. He reports that he does not use drugs.   Family History: The patient's family history includes Hypertension in his brother, father, and sister.   ROS:  Please see the history of present illness. Otherwise, complete review of systems is positive for none  All other systems are reviewed and negative.   Physical Exam: VS:  BP 118/60   Pulse 67   Ht 5\' 1"  (1.549 m)   Wt 202 lb 3.2 oz (91.7 kg)   SpO2 97%   BMI 38.21 kg/m , BMI Body mass index is 38.21 kg/m.  Wt Readings from Last 3 Encounters:  09/22/22 202 lb 3.2 oz (91.7 kg)  06/27/22 208 lb 5.4 oz (94.5 kg)  10/02/21 212 lb (96.2 kg)     General: Patient appears comfortable at rest. HEENT: Conjunctiva and lids normal, oropharynx clear with moist mucosa. Neck: Supple, no elevated JVP or carotid bruits, no thyromegaly. Lungs: Clear to auscultation, nonlabored breathing at rest. Cardiac: Regular rate and rhythm, no S3 or significant systolic murmur, no pericardial rub. Abdomen: Soft, nontender, no hepatomegaly, bowel sounds present, no guarding or rebound. Extremities: No pitting edema, distal pulses 2+. Skin: Warm and dry. Musculoskeletal: No kyphosis. Neuropsychiatric: Alert and oriented x3, affect grossly appropriate.  Recent Labwork: 06/26/2022: ALT 28; AST 25 06/27/2022: Hemoglobin 12.8; Magnesium 1.8; Platelets 226 06/28/2022: BUN 20; Creatinine, Ser 0.79; Potassium 3.5; Sodium 129  Component Value Date/Time   CHOL 195 10/24/2019 1027   CHOL 159 12/06/2018 0814   TRIG 100 10/24/2019 1027   HDL 73 10/24/2019 1027   HDL 65 12/06/2018 0814   CHOLHDL 2.7 10/24/2019 1027   VLDL 16 08/07/2014 0829   LDLCALC 102 (H) 10/24/2019 1027   LDLDIRECT  01/03/2009 0555    87 (NOTE) ATP III Classification (LDL):      < 100        mg/dL         Optimal     409 - 129     mg/dL         Near or Above Optimal     130 - 159     mg/dL         Borderline High     160 - 189     mg/dL         High      > 811        mg/dL         Very  High      Assessment and Plan:  Dizziness: Dizziness with exertion x 3 months associated with unsteady gait.  Probably neurological but will need to rule out cardiac etiologies.  Orthostatic vitals today in the clinic were negative for orthostatic hypotension and POTS.  Obtain 2D echocardiogram and 2-week event monitor, live.  Not safe to drive until event monitor is resulted.  In addition, he reported having weakness in the right arm but he has bilateral radial pulses are palpable, 2+.  Will obtain ultrasound carotid Doppler.    Ascending aorta aneurysm 4.2 cm in 2024: Annual surveillance with CTA  chest/aorta, due in 1 year.  BP goal less than 130/80 mmHg and strongly encourage smoking cessation.  Screening for AAA: Current smoker, will obtain ultrasound Doppler of abdomen.  HTN, partially controlled: I reviewed the home BPs that ranged between 120 and 170 mmHg SBP.  He is currently on both lisinopril and telmisartan, will discontinue telmisartan.  Increase lisinopril from 10 mg to 20 mg once daily and amlodipine 10 mg once daily.  Goal BP less than 130/80 mmHg.  Can follow-up with PCP.  Nicotine abuse: Used to smoke 3 packs/day and currently smoking 1 pack/day.  Recommended to cut down cigarette smoking which she is motivated to 2. Smoking cessation instruction/counseling given:  counseled patient on the dangers of tobacco use, advised patient to stop smoking, and reviewed strategies to maximize success   Cervical radiculopathy: Sometimes has weakness in his right arm associated with involuntary movements and also some numbness.       Medication Adjustments/Labs and Tests Ordered: Current medicines are reviewed at length with the patient today.  Concerns regarding medicines are outlined above.    Disposition:  Follow up  1 year  Signed Andrika Peraza Verne Spurr, MD, 09/22/2022 2:18 PM    [facility] [address] [place], [place], [place] [address]

## 2022-09-23 DIAGNOSIS — R42 Dizziness and giddiness: Secondary | ICD-10-CM | POA: Diagnosis not present

## 2022-09-28 DIAGNOSIS — Z125 Encounter for screening for malignant neoplasm of prostate: Secondary | ICD-10-CM | POA: Diagnosis not present

## 2022-09-28 DIAGNOSIS — E1165 Type 2 diabetes mellitus with hyperglycemia: Secondary | ICD-10-CM | POA: Diagnosis not present

## 2022-09-28 DIAGNOSIS — E559 Vitamin D deficiency, unspecified: Secondary | ICD-10-CM | POA: Diagnosis not present

## 2022-09-28 DIAGNOSIS — I10 Essential (primary) hypertension: Secondary | ICD-10-CM | POA: Diagnosis not present

## 2022-10-02 DIAGNOSIS — E1165 Type 2 diabetes mellitus with hyperglycemia: Secondary | ICD-10-CM | POA: Diagnosis not present

## 2022-10-02 DIAGNOSIS — M542 Cervicalgia: Secondary | ICD-10-CM | POA: Diagnosis not present

## 2022-10-02 DIAGNOSIS — E7800 Pure hypercholesterolemia, unspecified: Secondary | ICD-10-CM | POA: Diagnosis not present

## 2022-10-02 DIAGNOSIS — I7123 Aneurysm of the descending thoracic aorta, without rupture: Secondary | ICD-10-CM | POA: Diagnosis not present

## 2022-10-02 DIAGNOSIS — E876 Hypokalemia: Secondary | ICD-10-CM | POA: Diagnosis not present

## 2022-10-02 DIAGNOSIS — I700 Atherosclerosis of aorta: Secondary | ICD-10-CM | POA: Diagnosis not present

## 2022-10-02 DIAGNOSIS — L97909 Non-pressure chronic ulcer of unspecified part of unspecified lower leg with unspecified severity: Secondary | ICD-10-CM | POA: Diagnosis not present

## 2022-10-02 DIAGNOSIS — E559 Vitamin D deficiency, unspecified: Secondary | ICD-10-CM | POA: Diagnosis not present

## 2022-10-07 ENCOUNTER — Ambulatory Visit (INDEPENDENT_AMBULATORY_CARE_PROVIDER_SITE_OTHER): Payer: Medicare HMO

## 2022-10-07 ENCOUNTER — Ambulatory Visit: Payer: Medicare HMO | Attending: Internal Medicine

## 2022-10-07 DIAGNOSIS — R42 Dizziness and giddiness: Secondary | ICD-10-CM

## 2022-10-07 DIAGNOSIS — I7121 Aneurysm of the ascending aorta, without rupture: Secondary | ICD-10-CM

## 2022-10-07 LAB — ECHOCARDIOGRAM COMPLETE
AR max vel: 1.81 cm2
AV Area VTI: 1.94 cm2
AV Area mean vel: 1.85 cm2
AV Mean grad: 10.7 mmHg
AV Peak grad: 18.7 mmHg
Ao pk vel: 2.16 m/s
Area-P 1/2: 4.46 cm2
Calc EF: 68.3 %
MV VTI: 3.3 cm2
P 1/2 time: 436 msec
S' Lateral: 2.8 cm
Single Plane A2C EF: 73.8 %
Single Plane A4C EF: 64.1 %

## 2022-10-14 DIAGNOSIS — Z23 Encounter for immunization: Secondary | ICD-10-CM | POA: Diagnosis not present

## 2022-10-19 ENCOUNTER — Telehealth: Payer: Self-pay | Admitting: Internal Medicine

## 2022-10-19 NOTE — Telephone Encounter (Unsigned)
Patient said that he had turned in his heart monitor and want to make sure it was received.

## 2022-10-20 NOTE — Telephone Encounter (Signed)
It does not look like this was routed to anyone. The pt came into the office today to ask about this. I told him someone would get with him asap.

## 2022-10-20 NOTE — Telephone Encounter (Signed)
ZIO status is pending return they have not received it yet he sent it in mail late last week

## 2022-10-20 NOTE — Telephone Encounter (Signed)
Patient verbalized understanding we would call him with results when we get them

## 2022-11-12 ENCOUNTER — Other Ambulatory Visit (HOSPITAL_COMMUNITY): Payer: Medicare HMO

## 2022-12-01 DIAGNOSIS — R42 Dizziness and giddiness: Secondary | ICD-10-CM | POA: Diagnosis not present

## 2022-12-01 DIAGNOSIS — H6123 Impacted cerumen, bilateral: Secondary | ICD-10-CM | POA: Diagnosis not present

## 2022-12-11 DIAGNOSIS — N182 Chronic kidney disease, stage 2 (mild): Secondary | ICD-10-CM | POA: Diagnosis not present

## 2022-12-11 DIAGNOSIS — E1122 Type 2 diabetes mellitus with diabetic chronic kidney disease: Secondary | ICD-10-CM | POA: Diagnosis not present

## 2022-12-11 DIAGNOSIS — I129 Hypertensive chronic kidney disease with stage 1 through stage 4 chronic kidney disease, or unspecified chronic kidney disease: Secondary | ICD-10-CM | POA: Diagnosis not present

## 2022-12-11 DIAGNOSIS — R809 Proteinuria, unspecified: Secondary | ICD-10-CM | POA: Diagnosis not present

## 2022-12-24 DIAGNOSIS — M47812 Spondylosis without myelopathy or radiculopathy, cervical region: Secondary | ICD-10-CM | POA: Diagnosis not present

## 2022-12-24 DIAGNOSIS — R200 Anesthesia of skin: Secondary | ICD-10-CM | POA: Diagnosis not present

## 2023-01-22 ENCOUNTER — Other Ambulatory Visit (INDEPENDENT_AMBULATORY_CARE_PROVIDER_SITE_OTHER): Payer: Self-pay | Admitting: Nurse Practitioner

## 2023-01-22 DIAGNOSIS — Z87892 Personal history of anaphylaxis: Secondary | ICD-10-CM

## 2023-01-25 ENCOUNTER — Other Ambulatory Visit (INDEPENDENT_AMBULATORY_CARE_PROVIDER_SITE_OTHER): Payer: Self-pay | Admitting: Nurse Practitioner

## 2023-01-25 DIAGNOSIS — Z87892 Personal history of anaphylaxis: Secondary | ICD-10-CM

## 2023-02-02 DIAGNOSIS — E559 Vitamin D deficiency, unspecified: Secondary | ICD-10-CM | POA: Diagnosis not present

## 2023-02-02 DIAGNOSIS — E1165 Type 2 diabetes mellitus with hyperglycemia: Secondary | ICD-10-CM | POA: Diagnosis not present

## 2023-02-02 DIAGNOSIS — E871 Hypo-osmolality and hyponatremia: Secondary | ICD-10-CM | POA: Diagnosis not present

## 2023-02-02 DIAGNOSIS — I10 Essential (primary) hypertension: Secondary | ICD-10-CM | POA: Diagnosis not present

## 2023-02-04 DIAGNOSIS — M47812 Spondylosis without myelopathy or radiculopathy, cervical region: Secondary | ICD-10-CM | POA: Diagnosis not present

## 2023-02-04 DIAGNOSIS — Z6831 Body mass index (BMI) 31.0-31.9, adult: Secondary | ICD-10-CM | POA: Diagnosis not present

## 2023-02-05 ENCOUNTER — Encounter (INDEPENDENT_AMBULATORY_CARE_PROVIDER_SITE_OTHER): Payer: Self-pay | Admitting: *Deleted

## 2023-02-08 DIAGNOSIS — L97909 Non-pressure chronic ulcer of unspecified part of unspecified lower leg with unspecified severity: Secondary | ICD-10-CM | POA: Diagnosis not present

## 2023-02-08 DIAGNOSIS — I10 Essential (primary) hypertension: Secondary | ICD-10-CM | POA: Diagnosis not present

## 2023-02-08 DIAGNOSIS — H6123 Impacted cerumen, bilateral: Secondary | ICD-10-CM | POA: Diagnosis not present

## 2023-02-08 DIAGNOSIS — E559 Vitamin D deficiency, unspecified: Secondary | ICD-10-CM | POA: Diagnosis not present

## 2023-02-08 DIAGNOSIS — R42 Dizziness and giddiness: Secondary | ICD-10-CM | POA: Diagnosis not present

## 2023-02-08 DIAGNOSIS — I7123 Aneurysm of the descending thoracic aorta, without rupture: Secondary | ICD-10-CM | POA: Diagnosis not present

## 2023-02-08 DIAGNOSIS — E1165 Type 2 diabetes mellitus with hyperglycemia: Secondary | ICD-10-CM | POA: Diagnosis not present

## 2023-02-08 DIAGNOSIS — F32A Depression, unspecified: Secondary | ICD-10-CM | POA: Diagnosis not present

## 2023-03-03 ENCOUNTER — Ambulatory Visit (INDEPENDENT_AMBULATORY_CARE_PROVIDER_SITE_OTHER): Payer: Medicare Other

## 2023-03-03 ENCOUNTER — Telehealth: Payer: Self-pay | Admitting: Nurse Practitioner

## 2023-03-03 ENCOUNTER — Ambulatory Visit
Admission: EM | Admit: 2023-03-03 | Discharge: 2023-03-03 | Disposition: A | Discharge status: Home / Self Care | Payer: Medicare Other | Attending: Nurse Practitioner | Admitting: Nurse Practitioner

## 2023-03-03 DIAGNOSIS — I700 Atherosclerosis of aorta: Secondary | ICD-10-CM | POA: Diagnosis not present

## 2023-03-03 DIAGNOSIS — R059 Cough, unspecified: Secondary | ICD-10-CM | POA: Diagnosis not present

## 2023-03-03 DIAGNOSIS — J441 Chronic obstructive pulmonary disease with (acute) exacerbation: Secondary | ICD-10-CM | POA: Diagnosis not present

## 2023-03-03 DIAGNOSIS — R062 Wheezing: Secondary | ICD-10-CM | POA: Diagnosis not present

## 2023-03-03 MED ORDER — IPRATROPIUM-ALBUTEROL 0.5-2.5 (3) MG/3ML IN SOLN
3.0000 mL | Freq: Once | RESPIRATORY_TRACT | Status: AC
  Administered 2023-03-03: 3 mL via RESPIRATORY_TRACT

## 2023-03-03 MED ORDER — ALBUTEROL SULFATE HFA 108 (90 BASE) MCG/ACT IN AERS: 1.0000 | INHALATION_SPRAY | Freq: Four times a day (QID) | RESPIRATORY_TRACT | 0 refills | Status: AC | PRN

## 2023-03-03 MED ORDER — AZITHROMYCIN 250 MG PO TABS: ORAL_TABLET | ORAL | 0 refills | Status: DC

## 2023-03-03 MED ORDER — PREDNISONE 20 MG PO TABS: 40.0000 mg | ORAL_TABLET | Freq: Every day | ORAL | 0 refills | Status: AC

## 2023-03-03 MED ORDER — METHYLPREDNISOLONE SODIUM SUCC 125 MG IJ SOLR
125.0000 mg | Freq: Once | INTRAMUSCULAR | Status: AC
  Administered 2023-03-03: 125 mg via INTRAMUSCULAR

## 2023-03-03 NOTE — Telephone Encounter (Signed)
Can you please let this patient know the chest x-ray came back negative for pneumonia.  We do not need to change the treatment plan, continue with plan as we discussed today.

## 2023-03-03 NOTE — ED Notes (Addendum)
2 liter's [place] reapplied

## 2023-03-03 NOTE — ED Provider Notes (Signed)
[facility]    CSN: [id number] Arrival date & time: 03/03/23  1336      History   Chief Complaint No chief complaint on file.   HPI Dennis Mitchell is a 73 y.o. male.   Patient presents today with 3-day history of chills, congested cough that is nonproductive, shortness of breath, wheezing, chest pain and tightness, headache, decreased appetite, and fatigue.  He denies known fevers or bodyaches, runny or stuffy nose, sore throat, ear pain, abdominal pain, nausea/vomiting, and diarrhea since symptoms began.  No known sick contacts.  Has taken NyQuil and Mucinex for symptoms with minimal temporary improvement.  Patient reports he is a current smoker.  He has been diagnosed with COPD in the past.  Does not take any inhalers.  Is concerned about getting "addicted to" the inhalers.    Past Medical History:  Diagnosis Date   Anxiety    Arthritis    Back pain    Depression    Diabetes mellitus    due to weight loss, lost 70 lbs. due to gastric bypass    Dysphagia    Fatigue    History of hiatal hernia    Hypercholesteremia    Hypertension    Neuropathic pain    Skin cancer of face    Sleep apnea    Cpap   Trauma 2010   multiple traumas- neck, back, ribs, collar bones, - resulted in prolonged hosp. & rehab stay , with trach    Patient Active Problem List   Diagnosis Date Noted   Dizziness 09/22/2022   Aneurysm of ascending aorta (HCC) 09/22/2022   Cellulitis 06/27/2022   Right clavicle fracture 06/27/2022   Alcohol abuse 06/27/2022   Tobacco abuse 06/27/2022   Decreased pedal pulses 06/27/2022   Malignant neoplasm of trigone of urinary bladder (HCC); low grade, Ta; s/p TURBT 6/23 10/02/2021   Gross hematuria 05/15/2021   Bladder mass 05/15/2021   Obstructive sleep apnea syndrome 11/04/2020   Cervical spondylosis with myelopathy and radiculopathy 12/26/2018   Cervical spondylosis 12/26/2018   Morbid obesity (HCC) 03/19/2015   Erectile dysfunction  02/08/2013   Venous stasis 02/08/2013   Obstructive sleep apnea 07/31/2012   Essential hypertension, benign 07/31/2012   Depression 07/31/2012   Benign essential hypertension 07/31/2012   Benign paroxysmal positional vertigo 05/24/2012   Chronic back pain 04/06/2012   Insomnia 04/06/2012   Other and unspecified hyperlipidemia 04/06/2012   Hypotension, unspecified 01/01/2011   Allergic reaction 01/01/2011   CAP (community acquired pneumonia) 01/01/2011   ARF (acute renal failure) (HCC) 01/01/2011   DM type 2 (diabetes mellitus, type 2) (HCC) 01/01/2011   Type 2 diabetes mellitus (HCC) 01/01/2011   Low blood pressure 01/01/2011    Past Surgical History:  Procedure Laterality Date   ANTERIOR CERVICAL DECOMP/DISCECTOMY FUSION N/A 12/26/2018   Procedure: Anterior Cervical Decompression Fusion - Cervical Four - Cervical Five - Cervical Five - Cervical Six - Cervical Six - Cervical Seven;  Surgeon: Julio Sicks, MD;  Location: [facility] OR;  Service: Neurosurgery;  Laterality: N/A;  Anterior Cervical Decompression Fusion - Cervical Four - Cervical Five - Cervical Five - Cervical Six - Cervical Six - Cervical Seven   BREATH TEK H PYLORI N/A 03/01/2014   Procedure: BREATH TEK H PYLORI;  Surgeon: Ovidio Kin, MD;  Location: [facility];  Service: General;  Laterality: N/A;   COLONOSCOPY N/A 09/20/2013   Procedure: COLONOSCOPY;  Surgeon: Malissa Hippo, MD;  Location: [facility];  Service: Endoscopy;  Laterality: N/A;  930   CYSTOSCOPY W/ URETERAL STENT PLACEMENT Right 07/02/2021   Procedure: CYSTOSCOPY WITH  BILATERAL RETROGRADE PYELOGRAM/URETERAL STENT PLACEMENT;  Surgeon: Milderd Meager., MD;  Location: [facility];  Service: Urology;  Laterality: Right;   DOG BITE     LAPAROSCOPIC GASTRIC SLEEVE RESECTION WITH HIATAL HERNIA REPAIR N/A 03/19/2015   Procedure: LAPAROSCOPIC GASTRIC SLEEVE RESECTION WITH  HIATAL HERNIA REPAIR;  Surgeon: Ovidio Kin, MD;  Location: [facility];  Service: General;   Laterality: N/A;   TRACHEOSTOMY  2010   TRANSURETHRAL RESECTION OF BLADDER TUMOR N/A 07/02/2021   Procedure: TRANSURETHRAL RESECTION OF BLADDER TUMOR (TURBT);  Surgeon: Milderd Meager., MD;  Location: [facility];  Service: Urology;  Laterality: N/A;   UPPER GI ENDOSCOPY  03/19/2015   Procedure: UPPER GI ENDOSCOPY;  Surgeon: Ovidio Kin, MD;  Location: [facility];  Service: General;;       Home Medications    Prior to Admission medications   Medication Sig Start Date End Date Taking? Authorizing Provider  albuterol (VENTOLIN HFA) 108 (90 Base) MCG/ACT inhaler Inhale 1-2 puffs into the lungs every 6 (six) hours as needed for wheezing or shortness of breath. 03/03/23  Yes Valentino Nose, NP  azithromycin (ZITHROMAX) 250 MG tablet Take (2) tablets by mouth on day 1, then take (1) tablet by mouth on days 2-5. 03/03/23  Yes Valentino Nose, NP  predniSONE (DELTASONE) 20 MG tablet Take 2 tablets (40 mg total) by mouth daily with breakfast for 5 days. 03/03/23 03/08/23 Yes Valentino Nose, NP  Acetaminophen (TYLENOL ARTHRITIS PAIN PO) Take 650 mg by mouth every 8 (eight) hours as needed (pain).    [provider]  amLODipine (NORVASC) 10 MG tablet Take 1 tablet (10 mg total) by mouth daily. 06/28/22   Shon Hale, MD  Cholecalciferol 125 MCG (5000 UT) TABS Take 5,000 Units by mouth daily.    [provider]  citalopram (CELEXA) 40 MG tablet Take 1 tablet (40 mg total) by mouth daily. 08/29/20   Elenore Paddy, NP  cyclobenzaprine (FLEXERIL) 10 MG tablet Take 1 tablet (10 mg total) by mouth 3 (three) times daily as needed for muscle spasms. 08/29/20   Elenore Paddy, NP  diclofenac sodium (VOLTAREN) 1 % GEL APPLY TOPICALLY TWICE DAILY TO AFFECTED AREA AS NEEDED FOR PAIN Patient taking differently: Apply 2 g topically 2 (two) times daily as needed (pain knee). 02/18/17   Merlyn Albert, MD  EPINEPHrine 0.3 mg/0.3 mL IJ SOAJ injection Inject 0.3 mg into the muscle as needed for  anaphylaxis. 07/03/20   Elenore Paddy, NP  hydrochlorothiazide (HYDRODIURIL) 25 MG tablet Take 0.5 tablets (12.5 mg total) by mouth daily. 06/28/22   Shon Hale, MD  hydrOXYzine (VISTARIL) 25 MG capsule Take 1 capsule (25 mg total) by mouth every 8 (eight) hours as needed for itching. 08/29/20   Elenore Paddy, NP  leptospermum manuka honey (MEDIHONEY) PSTE paste Apply 1 Application topically daily. Apply to right leg wound for dressing changes 06/29/22   Shon Hale, MD  lisinopril (ZESTRIL) 20 MG tablet Take 1 tablet (20 mg total) by mouth daily. 09/22/22   Mallipeddi, Vishnu P, MD  meclizine (ANTIVERT) 25 MG tablet Take 1 tablet (25 mg total) by mouth 3 (three) times daily as needed for dizziness. 08/29/20   Elenore Paddy, NP  montelukast (SINGULAIR) 10 MG tablet Take 10 mg by mouth at bedtime as needed (itching).    [provider]  multivitamin-lutein (  OCUVITE-LUTEIN) CAPS capsule Take 1 capsule by mouth daily.    [provider]  potassium chloride (KLOR-CON) 10 MEQ tablet Take 1 tablet (10 mEq total) by mouth daily. Take While taking hydrochlorothiazide/HCTZ 06/28/22   Shon Hale, MD  sildenafil (VIAGRA) 100 MG tablet Take 100 mg by mouth daily as needed.    [provider]    Family History Family History  Problem Relation Age of Onset   Hypertension Father    Hypertension Sister    Hypertension Brother    Sleep apnea Neg Hx     Social History Social History   Tobacco Use   Smoking status: Some Days    Types: E-cigarettes, Cigarettes   Smokeless tobacco: Never   Tobacco comments:    Using electric cigarettes  Vaping Use   Vaping status: Every Day   Substances: Nicotine  Substance Use Topics   Alcohol use: Yes    Alcohol/week: 8.0 standard drinks of alcohol    Types: 8 Cans of beer per week   Drug use: No     Allergies   Bee venom, Aspirin, Lisinopril, and Penicillins   Review of Systems Review of Systems Per HPI  Physical  Exam Triage Vital Signs ED Triage Vitals  Encounter Vitals Group     BP 03/03/23 1340 (!) 148/70     Systolic BP Percentile --      Diastolic BP Percentile --      Pulse Rate 03/03/23 1340 87     Resp 03/03/23 1340 (!) 26     Temp 03/03/23 1340 98.7 F (37.1 C)     Temp Source 03/03/23 1340 Oral     SpO2 03/03/23 1340 (!) 87 %     Weight --      Height --      Head Circumference --      Peak Flow --      Pain Score 03/03/23 1343 0     Pain Loc --      Pain Education --      Exclude from Growth Chart --    No data found.  Updated Vital Signs BP (!) 148/70 (BP Location: Right Arm)   Pulse 87   Temp 98.7 F (37.1 C) (Oral)   Resp (!) 26   SpO2 92%   Visual Acuity Right Eye Distance:   Left Eye Distance:   Bilateral Distance:    Right Eye Near:   Left Eye Near:    Bilateral Near:     Physical Exam Vitals and nursing note reviewed.  Constitutional:      General: He is not in acute distress.    Appearance: Normal appearance. He is not ill-appearing or toxic-appearing.  HENT:     Head: Normocephalic and atraumatic.     Right Ear: Tympanic membrane, ear canal and external ear normal.     Left Ear: Tympanic membrane, ear canal and external ear normal.     Nose: No congestion or rhinorrhea.     Mouth/Throat:     Mouth: Mucous membranes are moist.     Pharynx: Oropharynx is clear. No oropharyngeal exudate or posterior oropharyngeal erythema.  Eyes:     General: No scleral icterus.    Extraocular Movements: Extraocular movements intact.  Cardiovascular:     Rate and Rhythm: Normal rate and regular rhythm.  Pulmonary:     Effort: Pulmonary effort is normal. No respiratory distress.     Breath sounds: Wheezing and rhonchi present. No rales.  Musculoskeletal:  Cervical back: Normal range of motion and neck supple.  Lymphadenopathy:     Cervical: No cervical adenopathy.  Skin:    General: Skin is warm and dry.     Coloration: Skin is not jaundiced or pale.      Findings: No erythema or rash.  Neurological:     Mental Status: He is alert and oriented to person, place, and time.  Psychiatric:        Behavior: Behavior is cooperative.      UC Treatments / Results  Labs (all labs ordered are listed, but only abnormal results are displayed) Labs Reviewed - No data to display  EKG   Radiology DG Chest 2 View Result Date: 03/03/2023 CLINICAL DATA:  Wheezing and cough. EXAM: CHEST - 2 VIEW COMPARISON:  Chest radiograph dated 06/26/2022. FINDINGS: There is diffuse chronic intra coarsening. Right lung base scarring. No focal consolidation, pleural effusion, pneumothorax. The cardiac silhouette is within normal limits. Atherosclerotic calcification of the aorta. No acute osseous pathology. Degenerative changes of the spine. IMPRESSION: No active cardiopulmonary disease. Electronically Signed   By: Elgie Collard M.D.   On: 03/03/2023 15:00    Procedures Procedures (including critical care time)  Medications Ordered in UC Medications  methylPREDNISolone sodium succinate (SOLU-MEDROL) 125 mg/2 mL injection 125 mg (125 mg Intramuscular Given 03/03/23 1352)  ipratropium-albuterol (DUONEB) 0.5-2.5 (3) MG/3ML nebulizer solution 3 mL (3 mLs Nebulization Given 03/03/23 1352)    Initial Impression / Assessment and Plan / UC Course  I have reviewed the triage vital signs and the nursing notes.  Pertinent labs & imaging results that were available during my care of the patient were reviewed by me and considered in my medical decision making (see chart for details).   Initially in triage, patient is tachypneic and SpO2 is 87% on room air.  Otherwise, vital signs are stable.  A DuoNeb was given along with 125 mg of IM Solu-Medrol and patient reported subjective improvement in breathing.  SpO2 increased to 90% on room air.  1. COPD exacerbation (HCC) Vitals improved after DuoNeb and IM Solu Medrol, examination is consistent with COPD exacerbation likely due  to to viral illness Chest x-ray pending at time of discharge, will treat for COPD exacerbation with albuterol inhaler every 6 hours scheduled for 48 hours, oral prednisone starting tomorrow, and azithromycin If chest x-ray shows pneumonia, will need to add coverage Return and ER precautions discussed with patient  The patient was given the opportunity to ask questions.  All questions answered to their satisfaction.  The patient is in agreement to this plan.    Final Clinical Impressions(s) / UC Diagnoses   Final diagnoses:  COPD exacerbation [facility])     Discharge Instructions      I will contact you later today with results of the chest x-ray.  We are treating you today for an exacerbation of your breathing.  We gave you a breathing treatment and shot of steroid medicine today.  Continue the albuterol every 6 hours as needed for wheezing or shortness of breath scheduled for the next 48 hours, then use as needed.  Recommend starting the oral prednisone tomorrow morning.  Start the azithromycin today.    Seek care in the emergency room if your symptoms worsen despite treatment.     ED Prescriptions     Medication Sig Dispense Auth. Provider   albuterol (VENTOLIN HFA) 108 (90 Base) MCG/ACT inhaler Inhale 1-2 puffs into the lungs every 6 (six) hours as needed for  wheezing or shortness of breath. 6.7 g Cathlean Marseilles A, NP   predniSONE (DELTASONE) 20 MG tablet Take 2 tablets (40 mg total) by mouth daily with breakfast for 5 days. 10 tablet Cathlean Marseilles A, NP   azithromycin (ZITHROMAX) 250 MG tablet Take (2) tablets by mouth on day 1, then take (1) tablet by mouth on days 2-5. 6 tablet Valentino Nose, NP      PDMP not reviewed this encounter.   Valentino Nose, NP 03/03/23 [phone]

## 2023-03-03 NOTE — ED Triage Notes (Signed)
Pt reports SOB x 3 days states he hasn't had the energy to do much but lay on the couch has been using nyquil and mucinex with sx's

## 2023-03-03 NOTE — ED Notes (Signed)
Pt placed on 2 liters . 86% on room air. 88% on 1 liter. NP notified and at bedside.

## 2023-03-03 NOTE — Telephone Encounter (Signed)
per NP, chest xray was negative for pneumonia and to continue to follow discharge instructions that was reviewed at visit. Attempted to call pt, no answer. Call went to voicemail. NP aware.

## 2023-03-03 NOTE — Discharge Instructions (Addendum)
I will contact you later today with results of the chest x-ray.  We are treating you today for an exacerbation of your breathing.  We gave you a breathing treatment and shot of steroid medicine today.  Continue the albuterol every 6 hours as needed for wheezing or shortness of breath scheduled for the next 48 hours, then use as needed.  Recommend starting the oral prednisone tomorrow morning.  Start the azithromycin today.    Seek care in the emergency room if your symptoms worsen despite treatment.

## 2023-03-04 ENCOUNTER — Telehealth: Payer: Self-pay

## 2023-03-04 NOTE — Telephone Encounter (Signed)
Returned pt's call for results of chest x-ray performed on yesterday,pt is aware of results and was instructed to continue with treatment plan as discussed in the clinic on yesterday. Pt verbalized understanding.

## 2023-03-05 DIAGNOSIS — Z79899 Other long term (current) drug therapy: Secondary | ICD-10-CM | POA: Diagnosis not present

## 2023-03-05 DIAGNOSIS — J441 Chronic obstructive pulmonary disease with (acute) exacerbation: Secondary | ICD-10-CM | POA: Diagnosis not present

## 2023-03-15 DIAGNOSIS — E211 Secondary hyperparathyroidism, not elsewhere classified: Secondary | ICD-10-CM | POA: Diagnosis not present

## 2023-03-15 DIAGNOSIS — R809 Proteinuria, unspecified: Secondary | ICD-10-CM | POA: Diagnosis not present

## 2023-03-15 DIAGNOSIS — N189 Chronic kidney disease, unspecified: Secondary | ICD-10-CM | POA: Diagnosis not present

## 2023-03-15 DIAGNOSIS — E559 Vitamin D deficiency, unspecified: Secondary | ICD-10-CM | POA: Diagnosis not present

## 2023-03-15 DIAGNOSIS — D649 Anemia, unspecified: Secondary | ICD-10-CM | POA: Diagnosis not present

## 2023-03-15 DIAGNOSIS — D631 Anemia in chronic kidney disease: Secondary | ICD-10-CM | POA: Diagnosis not present

## 2023-03-19 DIAGNOSIS — E1129 Type 2 diabetes mellitus with other diabetic kidney complication: Secondary | ICD-10-CM | POA: Diagnosis not present

## 2023-03-19 DIAGNOSIS — N182 Chronic kidney disease, stage 2 (mild): Secondary | ICD-10-CM | POA: Diagnosis not present

## 2023-03-19 DIAGNOSIS — I129 Hypertensive chronic kidney disease with stage 1 through stage 4 chronic kidney disease, or unspecified chronic kidney disease: Secondary | ICD-10-CM | POA: Diagnosis not present

## 2023-03-19 DIAGNOSIS — E1122 Type 2 diabetes mellitus with diabetic chronic kidney disease: Secondary | ICD-10-CM | POA: Diagnosis not present

## 2023-03-25 ENCOUNTER — Encounter (INDEPENDENT_AMBULATORY_CARE_PROVIDER_SITE_OTHER): Payer: Self-pay | Admitting: *Deleted

## 2023-04-02 DIAGNOSIS — J441 Chronic obstructive pulmonary disease with (acute) exacerbation: Secondary | ICD-10-CM | POA: Diagnosis not present

## 2023-04-02 DIAGNOSIS — Z8551 Personal history of malignant neoplasm of bladder: Secondary | ICD-10-CM | POA: Diagnosis not present

## 2023-04-02 DIAGNOSIS — L989 Disorder of the skin and subcutaneous tissue, unspecified: Secondary | ICD-10-CM | POA: Diagnosis not present

## 2023-04-02 DIAGNOSIS — F17210 Nicotine dependence, cigarettes, uncomplicated: Secondary | ICD-10-CM | POA: Diagnosis not present

## 2023-04-07 ENCOUNTER — Ambulatory Visit: Admitting: Urology

## 2023-04-07 VITALS — BP 154/88 | HR 76

## 2023-04-07 DIAGNOSIS — D494 Neoplasm of unspecified behavior of bladder: Secondary | ICD-10-CM

## 2023-04-07 DIAGNOSIS — C679 Malignant neoplasm of bladder, unspecified: Secondary | ICD-10-CM | POA: Diagnosis not present

## 2023-04-07 DIAGNOSIS — Z8551 Personal history of malignant neoplasm of bladder: Secondary | ICD-10-CM | POA: Diagnosis not present

## 2023-04-07 LAB — URINALYSIS, ROUTINE W REFLEX MICROSCOPIC
Bilirubin, UA: NEGATIVE
Ketones, UA: NEGATIVE
Leukocytes,UA: NEGATIVE
Nitrite, UA: NEGATIVE
RBC, UA: NEGATIVE
Specific Gravity, UA: 1.01 (ref 1.005–1.030)
Urobilinogen, Ur: 0.2 mg/dL (ref 0.2–1.0)
pH, UA: 7.5 (ref 5.0–7.5)

## 2023-04-07 MED ORDER — CIPROFLOXACIN HCL 500 MG PO TABS
500.0000 mg | ORAL_TABLET | Freq: Once | ORAL | Status: AC
Start: 2023-04-07 — End: 2023-04-07
  Administered 2023-04-07: 500 mg via ORAL

## 2023-04-07 NOTE — Progress Notes (Signed)
   04/07/23  CC: followup bladder cancer   HPI: Dennis Mitchell is a 73yo here for followup for bladder cancer Blood pressure (!) 154/88, pulse 76. NED. A&Ox3.   No respiratory distress   Abd soft, NT, ND Normal phallus with bilateral descended testicles  Cystoscopy Procedure Note  Patient identification was confirmed, informed consent was obtained, and patient was prepped using Betadine solution.  Lidocaine jelly was administered per urethral meatus.     Pre-Procedure: - Inspection reveals a normal caliber ureteral meatus.  Procedure: The flexible cystoscope was introduced without difficulty - No urethral strictures/lesions are present. - Enlarged prostate  - Normal bladder neck - Bilateral ureteral orifices identified - Bladder mucosa  reveals nright ureteral orifice tumor and right posterior wall tumor 1cm each - No bladder stones - No trabeculation     Post-Procedure: - Patient tolerated the procedure well  Assessment/ Plan: The risks/benefits/alternatives to transurethral resection of bladder tumors was explained to the patient and he understands and wishes to proceed with surgery  No follow-ups on file.  Wilkie Aye, MD

## 2023-04-11 ENCOUNTER — Encounter: Payer: Self-pay | Admitting: Urology

## 2023-04-11 NOTE — Patient Instructions (Signed)
 Transurethral Resection of Bladder Tumor  Transurethral resection of a bladder tumor is the removal (resection) of cancerous tissue (tumor) from the inside wall of the bladder. The bladder is the organ that holds urine. The tumor is removed through the tube that carries urine out of the body (urethra). In a transurethral resection, a thin telescope with a light, a tiny camera, and an electric cutting edge (resectoscope) is passed through the urethra. In men, the opening of the urethra is at the end of the penis. In women, it is just above the opening of the vagina. Tell a health care provider about: Any allergies you have. All medicines you are taking, including vitamins, herbs, eye drops, creams, and over-the-counter medicines. Any problems you or family members have had with anesthetic medicines. Any bleeding problems you have. Any surgeries you have had. Any medical conditions you have, including recent urinary tract infections. Whether you are pregnant or may be pregnant. What are the risks? Generally, this is a safe procedure. However, problems may occur, including: Infection. Bleeding. Allergic reactions to medicines. Damage to nearby structures or organs. Difficulty urinating from blockage of the urethra or not being able to urinate (urinary retention). Deep vein thrombosis. This is a blood clot that can develop in your leg. Recurring cancer. What happens before the procedure? When to stop eating and drinking Follow instructions from your health care provider about what you may eat and drink before your procedure. These may include: 8 hours before your procedure Stop eating most foods. Do not eat meat, fried foods, or fatty foods. Eat only light foods, such as toast or crackers. All liquids are okay except energy drinks and alcohol. 6 hours before your procedure Stop eating. Drink only clear liquids, such as water, clear fruit juice, black coffee, plain tea, and sports  drinks. Do not drink energy drinks or alcohol. 2 hours before your procedure Stop drinking all liquids. You may be allowed to take medicines with small sips of water. Medicines Ask your health care provider about: Changing or stopping your regular medicines. This is especially important if you are taking diabetes medicines or blood thinners. Taking medicines such as aspirin and ibuprofen. These medicines can thin your blood. Do not take these medicines unless your health care provider tells you to take them. Taking over-the-counter medicines, vitamins, herbs, and supplements. General instructions If you will be going home right after the procedure, plan to have a responsible adult: Take you home from the hospital or clinic. You will not be allowed to drive. Care for you for the time you are told. Ask your health care provider what steps will be taken to help prevent infection. These steps may include: Washing skin with a germ-killing soap. Taking antibiotic medicine. Do not use any products that contain nicotine or tobacco for at least 4 weeks before the procedure. These products include cigarettes, chewing tobacco, and vaping devices, such as e-cigarettes. If you need help quitting, ask your health care provider. What happens during the procedure? An IV will be inserted into one of your veins. You will be given one or more of the following: A medicine to help you relax (sedative). A medicine that is injected into your spine to numb the area below and slightly above the injection site (spinal anesthetic). A medicine that is injected into an area of your body to numb everything below the injection site (regional anesthetic). A medicine to make you fall asleep (general anesthetic). Your legs will be placed in foot rests (  stirrups) to open your legs and bend your knees. The resectoscope will be passed through your urethra and into your bladder. The part of your bladder with the tumor will be  resected by the cutting edge of the resectoscope. Fluid will be passed to rinse out the cut tissues (irrigation). The resectoscope will then be taken out. A small, thin tube (catheter) will be passed through your urethra and into your bladder. The catheter will drain urine into a bag outside of your body. The procedure may vary among health care providers and hospitals. What happens after the procedure? Your blood pressure, heart rate, breathing rate, and blood oxygen level will be monitored until you leave the hospital or clinic. You may continue to receive fluids and medicines through an IV. You will be given pain medicine to relieve pain. You will have a catheter to drain your urine. The amount of urine will be measured. If you have blood in your urine, your bladder may be rinsed out by passing fluid through your catheter. You will be encouraged to walk as soon as you can. You may have to wear compression stockings. These stockings help to prevent blood clots and reduce swelling in your legs. If you were given a sedative during the procedure, it can affect you for several hours. Do not drive or operate machinery until your health care provider says that it is safe. Summary Transurethral resection of a bladder tumor is the removal (resection) of a cancerous growth (tumor) on the inside wall of the bladder. To do this procedure, your health care provider uses a thin telescope with a light, a tiny camera, and an electric cutting edge (resectoscope) that is guided to your bladder through your urethra. The part of your bladder that is affected by the tumor will be resected by the cutting edge of the resectoscope. A catheter will be passed through your urethra and into your bladder. The catheter will drain urine into a bag outside of your body. If you will be going home right after the procedure, plan to have a responsible adult take you home from the hospital or clinic. You will not be allowed to  drive. This information is not intended to replace advice given to you by your health care provider. Make sure you discuss any questions you have with your health care provider. Document Revised: 01/03/2021 Document Reviewed: 01/03/2021 Elsevier Patient Education  2024 [employer].

## 2023-04-19 ENCOUNTER — Encounter (INDEPENDENT_AMBULATORY_CARE_PROVIDER_SITE_OTHER): Payer: Self-pay | Admitting: Gastroenterology

## 2023-04-19 ENCOUNTER — Ambulatory Visit (INDEPENDENT_AMBULATORY_CARE_PROVIDER_SITE_OTHER): Admitting: Gastroenterology

## 2023-04-19 VITALS — BP 122/68 | HR 72 | Temp 97.5°F | Ht 67.0 in | Wt 192.4 lb

## 2023-04-19 DIAGNOSIS — E611 Iron deficiency: Secondary | ICD-10-CM | POA: Diagnosis not present

## 2023-04-19 MED ORDER — PEG 3350-KCL-NA BICARB-NACL 420 G PO SOLR: 4000.0000 mL | Freq: Once | ORAL | 0 refills | Status: AC
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
## 2023-04-19 NOTE — Patient Instructions (Addendum)
 Schedule EGD and colonoscopy Continue oral iron intake daily
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
## 2023-04-27 ENCOUNTER — Other Ambulatory Visit (HOSPITAL_COMMUNITY)
Admission: RE | Admit: 2023-04-27 | Discharge: 2023-04-27 | Disposition: A | Discharge status: Home / Self Care | Source: Ambulatory Visit | Attending: Gastroenterology | Admitting: Gastroenterology

## 2023-04-27 DIAGNOSIS — E611 Iron deficiency: Secondary | ICD-10-CM | POA: Insufficient documentation

## 2023-04-27 LAB — BASIC METABOLIC PANEL WITH GFR
Anion gap: 11 (ref 5–15)
BUN: 24 mg/dL — ABNORMAL HIGH (ref 8–23)
CO2: 27 mmol/L (ref 22–32)
Calcium: 9 mg/dL (ref 8.9–10.3)
Chloride: 97 mmol/L — ABNORMAL LOW (ref 98–111)
Creatinine, Ser: 0.97 mg/dL (ref 0.61–1.24)
GFR, Estimated: >60 mL/min (ref >60)
Glucose, Bld: 93 mg/dL (ref 70–99)
Potassium: 4.2 mmol/L (ref 3.5–5.1)
Sodium: 135 mmol/L (ref 135–145)

## 2023-04-28 ENCOUNTER — Encounter (HOSPITAL_COMMUNITY)
Admission: RE | Disposition: A | Discharge status: Home / Self Care | Payer: Self-pay | Source: Home / Self Care | Attending: Gastroenterology

## 2023-04-28 ENCOUNTER — Ambulatory Visit (HOSPITAL_COMMUNITY): Admitting: Anesthesiology

## 2023-04-28 ENCOUNTER — Encounter (HOSPITAL_COMMUNITY): Payer: Self-pay | Admitting: Gastroenterology

## 2023-04-28 ENCOUNTER — Ambulatory Visit (HOSPITAL_COMMUNITY)
Admission: RE | Admit: 2023-04-28 | Discharge: 2023-04-28 | Disposition: A | Discharge status: Home / Self Care | Attending: Gastroenterology | Admitting: Gastroenterology

## 2023-04-28 ENCOUNTER — Encounter (INDEPENDENT_AMBULATORY_CARE_PROVIDER_SITE_OTHER): Payer: Self-pay | Admitting: *Deleted

## 2023-04-28 ENCOUNTER — Other Ambulatory Visit: Payer: Self-pay

## 2023-04-28 ENCOUNTER — Ambulatory Visit (HOSPITAL_BASED_OUTPATIENT_CLINIC_OR_DEPARTMENT_OTHER): Admitting: Anesthesiology

## 2023-04-28 DIAGNOSIS — Z9884 Bariatric surgery status: Secondary | ICD-10-CM | POA: Insufficient documentation

## 2023-04-28 DIAGNOSIS — D123 Benign neoplasm of transverse colon: Secondary | ICD-10-CM | POA: Diagnosis not present

## 2023-04-28 DIAGNOSIS — D128 Benign neoplasm of rectum: Secondary | ICD-10-CM | POA: Diagnosis not present

## 2023-04-28 DIAGNOSIS — F419 Anxiety disorder, unspecified: Secondary | ICD-10-CM | POA: Diagnosis not present

## 2023-04-28 DIAGNOSIS — E1122 Type 2 diabetes mellitus with diabetic chronic kidney disease: Secondary | ICD-10-CM | POA: Diagnosis not present

## 2023-04-28 DIAGNOSIS — K449 Diaphragmatic hernia without obstruction or gangrene: Secondary | ICD-10-CM | POA: Diagnosis not present

## 2023-04-28 DIAGNOSIS — D509 Iron deficiency anemia, unspecified: Secondary | ICD-10-CM | POA: Insufficient documentation

## 2023-04-28 DIAGNOSIS — N189 Chronic kidney disease, unspecified: Secondary | ICD-10-CM | POA: Insufficient documentation

## 2023-04-28 DIAGNOSIS — K3189 Other diseases of stomach and duodenum: Secondary | ICD-10-CM | POA: Insufficient documentation

## 2023-04-28 DIAGNOSIS — D124 Benign neoplasm of descending colon: Secondary | ICD-10-CM | POA: Diagnosis not present

## 2023-04-28 DIAGNOSIS — K621 Rectal polyp: Secondary | ICD-10-CM | POA: Insufficient documentation

## 2023-04-28 DIAGNOSIS — F17290 Nicotine dependence, other tobacco product, uncomplicated: Secondary | ICD-10-CM | POA: Insufficient documentation

## 2023-04-28 DIAGNOSIS — K2950 Unspecified chronic gastritis without bleeding: Secondary | ICD-10-CM | POA: Diagnosis not present

## 2023-04-28 DIAGNOSIS — K648 Other hemorrhoids: Secondary | ICD-10-CM

## 2023-04-28 DIAGNOSIS — I129 Hypertensive chronic kidney disease with stage 1 through stage 4 chronic kidney disease, or unspecified chronic kidney disease: Secondary | ICD-10-CM | POA: Diagnosis not present

## 2023-04-28 DIAGNOSIS — M1990 Unspecified osteoarthritis, unspecified site: Secondary | ICD-10-CM | POA: Diagnosis not present

## 2023-04-28 DIAGNOSIS — K5730 Diverticulosis of large intestine without perforation or abscess without bleeding: Secondary | ICD-10-CM

## 2023-04-28 DIAGNOSIS — G4733 Obstructive sleep apnea (adult) (pediatric): Secondary | ICD-10-CM | POA: Insufficient documentation

## 2023-04-28 DIAGNOSIS — K2210 Ulcer of esophagus without bleeding: Secondary | ICD-10-CM

## 2023-04-28 DIAGNOSIS — I10 Essential (primary) hypertension: Secondary | ICD-10-CM

## 2023-04-28 DIAGNOSIS — F32A Depression, unspecified: Secondary | ICD-10-CM | POA: Diagnosis not present

## 2023-04-28 DIAGNOSIS — Z8551 Personal history of malignant neoplasm of bladder: Secondary | ICD-10-CM | POA: Diagnosis not present

## 2023-04-28 DIAGNOSIS — K635 Polyp of colon: Secondary | ICD-10-CM | POA: Diagnosis not present

## 2023-04-28 DIAGNOSIS — K209 Esophagitis, unspecified without bleeding: Secondary | ICD-10-CM | POA: Diagnosis not present

## 2023-04-28 DIAGNOSIS — K319 Disease of stomach and duodenum, unspecified: Secondary | ICD-10-CM | POA: Diagnosis not present

## 2023-04-28 DIAGNOSIS — F17210 Nicotine dependence, cigarettes, uncomplicated: Secondary | ICD-10-CM | POA: Diagnosis not present

## 2023-04-28 HISTORY — PX: COLONOSCOPY: SHX5424

## 2023-04-28 LAB — GLUCOSE, CAPILLARY
Glucose-Capillary: 87 mg/dL (ref 70–99)
Glucose-Capillary: 75 mg/dL (ref 70–99)
Glucose-Capillary: 81 mg/dL (ref 70–99)

## 2023-04-28 LAB — HM COLONOSCOPY

## 2023-04-28 SURGERY — COLONOSCOPY
Anesthesia: General

## 2023-04-28 MED ORDER — PROPOFOL 10 MG/ML IV BOLUS
INTRAVENOUS | Status: DC | PRN
  Administered 2023-04-28: 80 mg via INTRAVENOUS
  Administered 2023-04-28: 20 mg via INTRAVENOUS

## 2023-04-28 MED ORDER — LACTATED RINGERS IV SOLN: INTRAVENOUS | Status: DC | PRN

## 2023-04-28 MED ORDER — PHENYLEPHRINE 80 MCG/ML (10ML) SYRINGE FOR IV PUSH (FOR BLOOD PRESSURE SUPPORT)
PREFILLED_SYRINGE | INTRAVENOUS | Status: DC | PRN
  Administered 2023-04-28: 80 ug via INTRAVENOUS

## 2023-04-28 MED ORDER — PROPOFOL 500 MG/50ML IV EMUL
INTRAVENOUS | Status: DC | PRN
  Administered 2023-04-28: 150 ug/kg/min via INTRAVENOUS

## 2023-04-28 MED ORDER — PANTOPRAZOLE SODIUM 40 MG PO TBEC: 40.0000 mg | DELAYED_RELEASE_TABLET | Freq: Every day | ORAL | 3 refills | Status: AC

## 2023-04-28 MED ORDER — LIDOCAINE HCL (CARDIAC) PF 100 MG/5ML IV SOSY
PREFILLED_SYRINGE | INTRAVENOUS | Status: DC | PRN
  Administered 2023-04-28: 50 mg via INTRATRACHEAL

## 2023-04-28 NOTE — Discharge Instructions (Addendum)
 You are being discharged to home.  Resume your previous diet.  We are waiting for your pathology results.  Take Protonix (pantoprazole) 40 mg by mouth once a day.  Your physician has recommended a repeat colonoscopy in one year for surveillance of multiple polyps.

## 2023-04-28 NOTE — Interval H&P Note (Addendum)
 History and Physical Interval Note:  04/28/2023 11:27 AM  Dennis Mitchell  has presented today for surgery, with the diagnosis of IRON DEFICIENCY ANEMIA.  The various methods of treatment have been discussed with the patient and family. After consideration of risks, benefits and other options for treatment, the patient has consented to  Procedure(s) with comments: COLONOSCOPY and ESOPHAGOGASTRODUODENOSCOPY (N/A) - 1:15PM; ASA 1 as a surgical intervention.  The patient's history has been reviewed, patient examined, no change in status, stable for surgery.  I have reviewed the patient's chart and labs.  Questions were answered to the patient's satisfaction.     [name]

## 2023-04-28 NOTE — Anesthesia Postprocedure Evaluation (Signed)
 Anesthesia Post Note  Patient: Dennis Mitchell  Procedure(s) Performed: COLONOSCOPY  Patient location during evaluation: PACU Anesthesia Type: General Level of consciousness: awake and alert Pain management: pain level controlled Vital Signs Assessment: post-procedure vital signs reviewed and stable Respiratory status: spontaneous breathing, nonlabored ventilation, respiratory function stable and patient connected to nasal cannula oxygen Cardiovascular status: stable and blood pressure returned to baseline Postop Assessment: no apparent nausea or vomiting Anesthetic complications: no  No notable events documented.   Last Vitals:  Vitals:   04/28/23 1352 04/28/23 1354  BP: (!) 92/59 118/74  Pulse: (!) 51 62  Resp: 16 14  Temp: 36.5 C   SpO2: 99% 99%    Last Pain:  Vitals:   04/28/23 1354  TempSrc:   PainSc: 0-No pain                 Beacher Limerick

## 2023-04-28 NOTE — Transfer of Care (Signed)
 Immediate Anesthesia Transfer of Care Note  Patient: Dennis Mitchell  Procedure(s) Performed: COLONOSCOPY  Patient Location: Endoscopy Unit  Anesthesia Type:General  Level of Consciousness: awake  Airway & Oxygen Therapy: Patient Spontanous Breathing  Post-op Assessment: Report given to RN  Post vital signs: Reviewed and stable  Last Vitals:  Vitals Value Taken Time  BP 92/59 04/28/23 1352  Temp 36.5 C 04/28/23 1352  Pulse 51 04/28/23 1352  Resp 16 04/28/23 1352  SpO2 99 % 04/28/23 1352    Last Pain:  Vitals:   04/28/23 1352  TempSrc: Oral  PainSc:       Patients Stated Pain Goal: 3 (04/28/23 1151)  Complications: No notable events documented.

## 2023-04-28 NOTE — Op Note (Signed)
 [facility] Patient Name: Dennis Mitchell Procedure Date: 04/28/2023 12:35 PM MRN: [id number] Date of Birth: 1950-06-25 Attending MD: [name] , , [id number] CSN: [id number] Age: 73 Admit Type: Outpatient Procedure:                Upper GI endoscopy Indications:              Iron deficiency Providers:                [name], Edrick Kins, RN, Volanda Napoleon., Technician Referring MD:              Medicines:                Monitored Anesthesia Care Complications:            No immediate complications. Estimated Blood Loss:     Estimated blood loss: none. Procedure:                Pre-Anesthesia Assessment:                           - Prior to the procedure, a History and Physical                            was performed, and patient medications, allergies                            and sensitivities were reviewed. The patient's                            tolerance of previous anesthesia was reviewed.                           - The risks and benefits of the procedure and the                            sedation options and risks were discussed with the                            patient. All questions were answered and informed                            consent was obtained.                           - ASA Grade Assessment: II - A patient with mild                            systemic disease.                           After obtaining informed consent, the endoscope was                            passed under direct vision. Throughout the  procedure, the patient's blood pressure, pulse, and                            oxygen saturations were monitored continuously. The                            [id number]-[id number] ([id number]) scope was introduced through the                            mouth, and advanced to the second part of duodenum.                            The upper GI endoscopy was accomplished without                             difficulty. The patient tolerated the procedure                            well. Scope In: 12:54:22 PM Scope Out: 1:03:02 PM Total Procedure Duration: 0 hours 8 minutes 40 seconds  Findings:      LA Grade D (one or more mucosal breaks involving at least 75% of       esophageal circumference) esophagitis with no bleeding was found at the       gastroesophageal junction. Biopsies were taken with a cold forceps for       histology.      A 2 cm hiatal hernia was present.      Evidence of a sleeve gastrectomy was found in the gastric body. This was       characterized by healthy appearing mucosa. There was some scant old       hematin in the lumen. Biopsies were taken with a cold forceps for       Helicobacter pylori testing.      The examined duodenum was normal. Biopsies were taken with a cold       forceps for histology. Impression:               - LA Grade D esophagitis with no bleeding. Biopsied.                           - 2 cm hiatal hernia.                           - A sleeve gastrectomy was found, characterized by                            healthy appearing mucosa. Biopsied.                           - Normal examined duodenum. Biopsied. Moderate Sedation:      Per Anesthesia Care Recommendation:           - Discharge patient to home (ambulatory).                           - Resume previous diet.                           -  Await pathology results.                           - Use Protonix (pantoprazole) 40 mg PO daily. Procedure Code(s):        --- Professional ---                           [phone], Esophagogastroduodenoscopy, flexible,                            transoral; with biopsy, single or multiple Diagnosis Code(s):        --- Professional ---                           K20.90, Esophagitis, unspecified without bleeding                           K44.9, Diaphragmatic hernia without obstruction or                            gangrene                            Z98.84, Bariatric surgery status                           D50.9, Iron deficiency anemia, unspecified CPT copyright 2022 [employer]. All rights reserved. The codes documented in this report are preliminary and upon coder review may  be revised to meet current compliance requirements. Samantha Cress, MD Samantha Cress,  04/28/2023 1:55:03 PM This report has been signed electronically. Number of Addenda: 0

## 2023-04-28 NOTE — Op Note (Signed)
 [facility] Patient Name: Dennis Mitchell Procedure Date: 04/28/2023 12:09 PM MRN: [id number] Date of Birth: 09-Aug-1950 Attending MD: [name] , , [id number] CSN: [id number] Age: 73 Admit Type: Outpatient Procedure:                Colonoscopy Indications:              Iron deficiency Providers:                [name], Edrick Kins, RN, Lennice Sites                            Technician, Technician Referring MD:              Medicines:                Monitored Anesthesia Care Complications:            No immediate complications. Estimated Blood Loss:     Estimated blood loss: none. Procedure:                Pre-Anesthesia Assessment:                           - Prior to the procedure, a History and Physical                            was performed, and patient medications, allergies                            and sensitivities were reviewed. The patient's                            tolerance of previous anesthesia was reviewed.                           - The risks and benefits of the procedure and the                            sedation options and risks were discussed with the                            patient. All questions were answered and informed                            consent was obtained.                           - ASA Grade Assessment: II - A patient with mild                            systemic disease.                           After obtaining informed consent, the colonoscope                            was passed under direct vision. Throughout the  procedure, the patient's blood pressure, pulse, and                            oxygen saturations were monitored continuously. The                            [id number]-[id number] ([id number]) scope was introduced through                            the anus and advanced to the the cecum, identified                            by appendiceal orifice and ileocecal valve. The                             colonoscopy was performed without difficulty. The                            patient tolerated the procedure well. The quality                            of the bowel preparation was good. Scope In: 1:08:59 PM Scope Out: 1:44:40 PM Scope Withdrawal Time: 0 hours 25 minutes 7 seconds  Total Procedure Duration: 0 hours 35 minutes 41 seconds  Findings:      The perianal and digital rectal examinations were normal.      Fourteen sessile polyps were found in the rectum, descending colon and       transverse colon. The polyps were 2 to 10 mm in size. These polyps were       removed with a cold snare. Resection and retrieval were complete.      A few small-mouthed diverticula were found in the sigmoid colon.      Non-bleeding internal hemorrhoids were found during retroflexion. The       hemorrhoids were small. Impression:               - Fourteen 2 to 10 mm polyps in the rectum, in the                            descending colon and in the transverse colon,                            removed with a cold snare. Resected and retrieved.                           - Diverticulosis in the sigmoid colon.                           - Non-bleeding internal hemorrhoids. Moderate Sedation:      Per Anesthesia Care Recommendation:           - Discharge patient to home (ambulatory).                           - Resume previous diet.                           -  Await pathology results.                           - Repeat colonoscopy in 1 year for surveillance of                            multiple polyps. Procedure Code(s):        --- Professional ---                           [phone], Colonoscopy, flexible; with removal of                            tumor(s), polyp(s), or other lesion(s) by snare                            technique Diagnosis Code(s):        --- Professional ---                           D12.8, Benign neoplasm of rectum                           D12.4, Benign neoplasm of descending  colon                           D12.3, Benign neoplasm of transverse colon (hepatic                            flexure or splenic flexure)                           K64.8, Other hemorrhoids                           D50.9, Iron deficiency anemia, unspecified                           K57.30, Diverticulosis of large intestine without                            perforation or abscess without bleeding CPT copyright 2022 American Medical Association. All rights reserved. The codes documented in this report are preliminary and upon coder review may  be revised to meet current compliance requirements. Samantha Cress, MD Samantha Cress,  04/28/2023 1:57:50 PM This report has been signed electronically. Number of Addenda: 0

## 2023-04-28 NOTE — Interval H&P Note (Deleted)
 History and Physical Interval Note:  04/28/2023 11:27 AM  Dennis Mitchell  has presented today for surgery, with the diagnosis of IRON DEFICIENCY ANEMIA.  The various methods of treatment have been discussed with the patient and family. After consideration of risks, benefits and other options for treatment, the patient has consented to  Procedure(s) with comments: COLONOSCOPY (N/A) - 1:15PM; ASA 1 as a surgical intervention.  The patient's history has been reviewed, patient examined, no change in status, stable for surgery.  I have reviewed the patient's chart and labs.  Questions were answered to the patient's satisfaction.     [name]

## 2023-04-28 NOTE — Anesthesia Preprocedure Evaluation (Addendum)
 Anesthesia Evaluation  Patient identified by MRN, date of birth, ID band Patient awake    Reviewed: Allergy & Precautions, H&P , NPO status , Patient's Chart, lab work & pertinent test results  Airway Mallampati: IV  TM Distance: >3 FB Neck ROM: Limited   Comment: History of difficult intubation Severely limited neck ROM Dental no notable dental hx.    Pulmonary sleep apnea , pneumonia, Current Smoker and Patient abstained from smoking.   Pulmonary exam normal breath sounds clear to auscultation       Cardiovascular hypertension, Normal cardiovascular exam Rhythm:Regular Rate:Normal  Ef 65%   Neuro/Psych  PSYCHIATRIC DISORDERS Anxiety Depression     Neuromuscular disease    GI/Hepatic negative GI ROS, Neg liver ROS, hiatal hernia,,,  Endo/Other  diabetes    Renal/GU Renal diseaseHistory of ARF  negative genitourinary   Musculoskeletal negative musculoskeletal ROS (+) Arthritis ,    Abdominal Normal abdominal exam  (+)   Peds negative pediatric ROS (+)  Hematology negative hematology ROS (+)   Anesthesia Other Findings   Reproductive/Obstetrics negative OB ROS                             Anesthesia Physical Anesthesia Plan  ASA: 3  Anesthesia Plan: General   Post-op Pain Management:    Induction: Intravenous  PONV Risk Score and Plan: 0 and Propofol infusion  Airway Management Planned: Nasal Cannula  Additional Equipment:   Intra-op Plan:   Post-operative Plan:   Informed Consent: I have reviewed the patients History and Physical, chart, labs and discussed the procedure including the risks, benefits and alternatives for the proposed anesthesia with the patient or authorized representative who has indicated his/her understanding and acceptance.     Dental advisory given  Plan Discussed with: CRNA  Anesthesia Plan Comments:        Anesthesia Quick Evaluation

## 2023-04-29 ENCOUNTER — Encounter (HOSPITAL_COMMUNITY): Payer: Self-pay | Admitting: Gastroenterology

## 2023-04-29 LAB — SURGICAL PATHOLOGY

## 2023-05-03 ENCOUNTER — Encounter (INDEPENDENT_AMBULATORY_CARE_PROVIDER_SITE_OTHER): Payer: Self-pay | Admitting: *Deleted

## 2023-05-08 DIAGNOSIS — W1830XA Fall on same level, unspecified, initial encounter: Secondary | ICD-10-CM | POA: Diagnosis not present

## 2023-05-08 DIAGNOSIS — Z880 Allergy status to penicillin: Secondary | ICD-10-CM | POA: Diagnosis not present

## 2023-05-08 DIAGNOSIS — Z833 Family history of diabetes mellitus: Secondary | ICD-10-CM | POA: Diagnosis not present

## 2023-05-08 DIAGNOSIS — G4733 Obstructive sleep apnea (adult) (pediatric): Secondary | ICD-10-CM | POA: Diagnosis not present

## 2023-05-08 DIAGNOSIS — I272 Pulmonary hypertension, unspecified: Secondary | ICD-10-CM | POA: Diagnosis not present

## 2023-05-08 DIAGNOSIS — M25861 Other specified joint disorders, right knee: Secondary | ICD-10-CM | POA: Diagnosis not present

## 2023-05-08 DIAGNOSIS — E11622 Type 2 diabetes mellitus with other skin ulcer: Secondary | ICD-10-CM | POA: Diagnosis not present

## 2023-05-08 DIAGNOSIS — E871 Hypo-osmolality and hyponatremia: Secondary | ICD-10-CM | POA: Diagnosis not present

## 2023-05-08 DIAGNOSIS — E878 Other disorders of electrolyte and fluid balance, not elsewhere classified: Secondary | ICD-10-CM | POA: Diagnosis not present

## 2023-05-08 DIAGNOSIS — Z23 Encounter for immunization: Secondary | ICD-10-CM | POA: Diagnosis not present

## 2023-05-08 DIAGNOSIS — M4802 Spinal stenosis, cervical region: Secondary | ICD-10-CM | POA: Diagnosis not present

## 2023-05-08 DIAGNOSIS — M11261 Other chondrocalcinosis, right knee: Secondary | ICD-10-CM | POA: Diagnosis not present

## 2023-05-08 DIAGNOSIS — I10 Essential (primary) hypertension: Secondary | ICD-10-CM | POA: Diagnosis not present

## 2023-05-08 DIAGNOSIS — J449 Chronic obstructive pulmonary disease, unspecified: Secondary | ICD-10-CM | POA: Diagnosis not present

## 2023-05-08 DIAGNOSIS — H8110 Benign paroxysmal vertigo, unspecified ear: Secondary | ICD-10-CM | POA: Diagnosis not present

## 2023-05-08 DIAGNOSIS — T502X5A Adverse effect of carbonic-anhydrase inhibitors, benzothiadiazides and other diuretics, initial encounter: Secondary | ICD-10-CM | POA: Diagnosis not present

## 2023-05-08 DIAGNOSIS — E876 Hypokalemia: Secondary | ICD-10-CM | POA: Diagnosis not present

## 2023-05-08 DIAGNOSIS — S0990XA Unspecified injury of head, initial encounter: Secondary | ICD-10-CM | POA: Diagnosis not present

## 2023-05-08 DIAGNOSIS — M25461 Effusion, right knee: Secondary | ICD-10-CM | POA: Diagnosis not present

## 2023-05-08 DIAGNOSIS — Z79899 Other long term (current) drug therapy: Secondary | ICD-10-CM | POA: Diagnosis not present

## 2023-05-08 DIAGNOSIS — Y9289 Other specified places as the place of occurrence of the external cause: Secondary | ICD-10-CM | POA: Diagnosis not present

## 2023-05-08 DIAGNOSIS — Z7984 Long term (current) use of oral hypoglycemic drugs: Secondary | ICD-10-CM | POA: Diagnosis not present

## 2023-05-09 DIAGNOSIS — I10 Essential (primary) hypertension: Secondary | ICD-10-CM | POA: Diagnosis not present

## 2023-05-09 DIAGNOSIS — H8110 Benign paroxysmal vertigo, unspecified ear: Secondary | ICD-10-CM | POA: Diagnosis not present

## 2023-05-09 DIAGNOSIS — E871 Hypo-osmolality and hyponatremia: Secondary | ICD-10-CM | POA: Diagnosis not present

## 2023-05-10 NOTE — Patient Instructions (Signed)
 Dennis Mitchell  05/10/2023     @PREFPERIOPPHARMACY @   Your procedure is scheduled on 05/13/2023.   Report to [facility] at 7:00 A.M.   Call this number if you have problems the morning of surgery:   [phone]  If you experience any cold or flu symptoms such as cough, fever, chills, shortness of breath, etc. between now and your scheduled surgery, please notify us  at the above number.   Remember:   Do not eat after midnight.   You may drink clear liquids until 5:00 AM .  Clear liquids allowed are:                    Water , Juice (No red color; non-citric and without pulp; diabetics please choose diet or no sugar options), Carbonated beverages (diabetics please choose diet or no sugar options), Clear Tea (No creamer, milk, or cream, including half & half and powdered creamer), Black Coffee Only (No creamer, milk or cream, including half & half and powdered creamer), Clear Sports drink (No red color; diabetics please choose diet or no sugar options), and Plain Popsicles Only (No red color; diabetics please choose no sugar options)    Take these medicines the morning of surgery with A SIP OF WATER  : Norvasc  Celexa  Flexeril  Neurontin Visteril Protonix    Please use your Albuterol  Inhaler before you come and bring it with you to the hospital.   Last dose of Jardiance should be 05/09/2023.    Do not take any diabetic medication the am of the procedure,     Do not wear jewelry, make-up or nail polish, including gel polish,  artificial nails, or any other type of covering on natural nails (fingers and  toes).  Do not wear lotions, powders, or perfumes, or deodorant.  Do not shave 48 hours prior to surgery.  Men may shave face and neck.  Do not bring valuables to the hospital.  [facility] is not responsible for any belongings or valuables.  Contacts, dentures or bridgework may not be worn into surgery.  Leave your suitcase in the car.  After surgery it may be brought to your  room.  For patients admitted to the hospital, discharge time will be determined by your treatment team.  Patients discharged the day of surgery will not be allowed to drive home.   Name and phone number of your driver:   Family  Special instructions:  N/A  Please read over the following fact sheets that you were given.  Care and Recovery After Surgery   Transurethral Resection of Bladder Tumor  Transurethral resection of a bladder tumor is the removal (resection) of cancerous tissue (tumor) from the inside wall of the bladder. The bladder is the organ that holds urine. The tumor is removed through the tube that carries urine out of the body (urethra). In a transurethral resection, a thin telescope with a light, a tiny camera, and an electric cutting edge (resectoscope) is passed through the urethra. In men, the opening of the urethra is at the end of the penis. In women, it is just above the opening of the vagina. Tell a health care provider about: Any allergies you have. All medicines you are taking, including vitamins, herbs, eye drops, creams, and over-the-counter medicines. Any problems you or family members have had with anesthetic medicines. Any bleeding problems you have. Any surgeries you have had. Any medical conditions you have, including recent urinary tract infections. Whether you are pregnant or may be  pregnant. What are the risks? Generally, this is a safe procedure. However, problems may occur, including: Infection. Bleeding. Allergic reactions to medicines. Damage to nearby structures or organs. Difficulty urinating from blockage of the urethra or not being able to urinate (urinary retention). Deep vein thrombosis. This is a blood clot that can develop in your leg. Recurring cancer. What happens before the procedure? When to stop eating and drinking Follow instructions from your health care provider about what you may eat and drink before your procedure. These  may include: 8 hours before your procedure Stop eating most foods. Do not eat meat, fried foods, or fatty foods. Eat only light foods, such as toast or crackers. All liquids are okay except energy drinks and alcohol. 6 hours before your procedure Stop eating. Drink only clear liquids, such as water , clear fruit juice, black coffee, plain tea, and sports drinks. Do not drink energy drinks or alcohol. 2 hours before your procedure Stop drinking all liquids. You may be allowed to take medicines with small sips of water . Medicines Ask your health care provider about: Changing or stopping your regular medicines. This is especially important if you are taking diabetes medicines or blood thinners. Taking medicines such as aspirin and ibuprofen. These medicines can thin your blood. Do not take these medicines unless your health care provider tells you to take them. Taking over-the-counter medicines, vitamins, herbs, and supplements. General instructions If you will be going home right after the procedure, plan to have a responsible adult: Take you home from the hospital or clinic. You will not be allowed to drive. Care for you for the time you are told. Ask your health care provider what steps will be taken to help prevent infection. These steps may include: Washing skin with a germ-killing soap. Taking antibiotic medicine. Do not use any products that contain nicotine  or tobacco for at least 4 weeks before the procedure. These products include cigarettes, chewing tobacco, and vaping devices, such as e-cigarettes. If you need help quitting, ask your health care provider. What happens during the procedure? An IV will be inserted into one of your veins. You will be given one or more of the following: A medicine to help you relax (sedative). A medicine that is injected into your spine to numb the area below and slightly above the injection site (spinal anesthetic). A medicine that is injected into  an area of your body to numb everything below the injection site (regional anesthetic). A medicine to make you fall asleep (general anesthetic). Your legs will be placed in foot rests (stirrups) to open your legs and bend your knees. The resectoscope will be passed through your urethra and into your bladder. The part of your bladder with the tumor will be resected by the cutting edge of the resectoscope. Fluid will be passed to rinse out the cut tissues (irrigation). The resectoscope will then be taken out. A small, thin tube (catheter) will be passed through your urethra and into your bladder. The catheter will drain urine into a bag outside of your body. The procedure may vary among health care providers and hospitals. What happens after the procedure? Your blood pressure, heart rate, breathing rate, and blood oxygen level will be monitored until you leave the hospital or clinic. You may continue to receive fluids and medicines through an IV. You will be given pain medicine to relieve pain. You will have a catheter to drain your urine. The amount of urine will be measured. If you have blood  in your urine, your bladder may be rinsed out by passing fluid through your catheter. You will be encouraged to walk as soon as you can. You may have to wear compression stockings. These stockings help to prevent blood clots and reduce swelling in your legs. If you were given a sedative during the procedure, it can affect you for several hours. Do not drive or operate machinery until your health care provider says that it is safe. Summary Transurethral resection of a bladder tumor is the removal (resection) of a cancerous growth (tumor) on the inside wall of the bladder. To do this procedure, your health care provider uses a thin telescope with a light, a tiny camera, and an electric cutting edge (resectoscope) that is guided to your bladder through your urethra. The part of your bladder that is affected by  the tumor will be resected by the cutting edge of the resectoscope. A catheter will be passed through your urethra and into your bladder. The catheter will drain urine into a bag outside of your body. If you will be going home right after the procedure, plan to have a responsible adult take you home from the hospital or clinic. You will not be allowed to drive. This information is not intended to replace advice given to you by your health care provider. Make sure you discuss any questions you have with your health care provider. Document Revised: 01/03/2021 Document Reviewed: 01/03/2021 Elsevier Patient Education  2024 [employer]  General Anesthesia, Adult General anesthesia is the use of medicine to make you fall asleep (unconscious) for a medical procedure. General anesthesia must be used for certain procedures. It is often recommended for surgery or procedures that: Last a long time. Require you to be still or in an unusual position. Are major and can cause blood loss. Affect your breathing. The medicines used for general anesthesia are called general anesthetics. During general anesthesia, these medicines are given along with medicines that: Prevent pain. Control your blood pressure. Relax your muscles. Prevent nausea and vomiting after the procedure. Tell a health care provider about: Any allergies you have. All medicines you are taking, including vitamins, herbs, eye drops, creams, and over-the-counter medicines. Your history of any: Medical conditions you have, including: High blood pressure. Bleeding problems. Diabetes. Heart or lung conditions, such as: Heart failure. Sleep apnea. Asthma. Chronic obstructive pulmonary disease (COPD). Current or recent illnesses, such as: Upper respiratory, chest, or ear infections. Cough or fever. Tobacco or drug use, including marijuana or alcohol use. Depression or anxiety. Surgeries and types of anesthetics you have had. Problems  you or family members have had with anesthetic medicines. Whether you are pregnant or may be pregnant. Whether you have any chipped or loose teeth, dentures, caps, bridgework, or issues with your mouth, swallowing, or choking. What are the risks? Your health care provider will talk with you about risks. These may include: Allergic reaction to the medicines. Lung and heart problems. Inhaling food or liquid from the stomach into the lungs (aspiration). Nerve injury. Injury to the lips, mouth, teeth, or gums. Stroke. Waking up during your procedure and being unable to move. This is rare. These problems are more likely to develop if you are having a major surgery or if you have an advanced or serious medical condition. You can prevent some of these complications by answering all of your health care provider's questions thoroughly and by following all instructions before your procedure. General anesthesia can cause side effects, including: Nausea or vomiting.  A sore throat or hoarseness from the breathing tube. Wheezing or coughing. Shaking chills or feeling cold. Body aches. Sleepiness. Confusion, agitation (delirium), or anxiety. What happens before the procedure? When to stop eating and drinking Follow instructions from your health care provider about what you may eat and drink before your procedure. If you do not follow your health care provider's instructions, your procedure may be delayed or canceled. Medicines Ask your health care provider about: Changing or stopping your regular medicines. These include any diabetes medicines or blood thinners you take. Taking medicines such as aspirin and ibuprofen. These medicines can thin your blood. Do not take them unless your health care provider tells you to. Taking over-the-counter medicines, vitamins, herbs, and supplements. General instructions Do not use any products that contain nicotine  or tobacco for at least 4 weeks before the  procedure. These products include cigarettes, chewing tobacco, and vaping devices, such as e-cigarettes. If you need help quitting, ask your health care provider. If you brush your teeth on the morning of the procedure, make sure to spit out all of the water  and toothpaste. If told by your health care provider, bring your sleep apnea device with you to surgery (if applicable). If you will be going home right after the procedure, plan to have a responsible adult: Take you home from the hospital or clinic. You will not be allowed to drive. Care for you for the time you are told. What happens during the procedure?  An IV will be inserted into one of your veins. You will be given one or more of the following through a face mask or IV: A sedative. This helps you relax. Anesthesia. This will: Numb certain areas of your body. Make you fall asleep for surgery. After you are unconscious, a breathing tube may be inserted down your throat to help you breathe. This will be removed before you wake up. An anesthesia provider, such as an anesthesiologist, will stay with you throughout your procedure. The anesthesia provider will: Keep you comfortable and safe by continuing to give you medicines and adjusting the amount of medicine that you get. Monitor your blood pressure, heart rate, and oxygen levels to make sure that the anesthetics do not cause any problems. The procedure may vary among health care providers and hospitals. What happens after the procedure? Your blood pressure, temperature, heart rate, breathing rate, and blood oxygen level will be monitored until you leave the hospital or clinic. You will wake up in a recovery area. You may wake up slowly. You may be given medicine to help you with pain, nausea, or any other side effects from the anesthesia. Summary General anesthesia is the use of medicine to make you fall asleep (unconscious) for a medical procedure. Follow your health care  provider's instructions about when to stop eating, drinking, or taking certain medicines before your procedure. Plan to have a responsible adult take you home from the hospital or clinic. This information is not intended to replace advice given to you by your health care provider. Make sure you discuss any questions you have with your health care provider. Document Revised: 03/27/2021 Document Reviewed: 03/27/2021 Elsevier Patient Education  2024 Else[employer] Inc.  How to Use Chlorhexidine  at Home in the Shower Chlorhexidine  gluconate (CHG) is a germ-killing (antiseptic) wash that's used to clean the skin. It can get rid of the germs that normally live on the skin and can keep them away for about 24 hours. If you're having surgery, you may be  told to shower with CHG at home the night before surgery. This can help lower your risk for infection. To use CHG wash in the shower, follow the steps below. Supplies needed: CHG body wash. Clean washcloth. Clean towel. How to use CHG in the shower Follow these steps unless you're told to use CHG in a different way: Start the shower. Use your normal soap and shampoo to wash your face and hair. Turn off the shower or move out of the shower stream. Pour CHG onto a clean washcloth. Do not use any type of brush or rough sponge. Start at your neck, washing your body down to your toes. Make sure you: Wash the part of your body where the surgery will be done for at least 1 minute. Do not scrub. Do not use CHG on your head or face unless your health care provider tells you to. If it gets into your ears or eyes, rinse them well with water . Do not wash your genitals with CHG. Wash your back and under your arms. Make sure to wash skin folds. Let the CHG sit on your skin for 1-2 minutes or as long as told. Rinse your entire body in the shower, including all body creases and folds. Turn off the shower. Dry off with a clean towel. Do not put anything on your skin  afterward, such as powder, lotion, or perfume. Put on clean clothes or pajamas. If it's the night before surgery, sleep in clean sheets. General tips Use CHG only as told, and follow the instructions on the label. Use the full amount of CHG as told. This is often one bottle. Do not smoke and stay away from flames after using CHG. Your skin may feel sticky after using CHG. This is normal. The sticky feeling will go away as the CHG dries. Do not use CHG: If you have a chlorhexidine  allergy or have reacted to chlorhexidine  in the past. On open wounds or areas of skin that have broken skin, cuts, or scrapes. On babies younger than 13 months of age. Contact a health care provider if: You have questions about using CHG. Your skin gets irritated or itchy. You have a rash after using CHG. You swallow any CHG. Call your local poison control center [phone] in the [place] Your eyes itch badly, or they become very red or swollen. Your hearing changes. You have trouble seeing. If you can't reach your provider, go to an urgent care or emergency room. Do not drive yourself. Get help right away if: You have swelling or tingling in your mouth or throat. You make high-pitched whistling sounds when you breathe, most often when you breathe out (wheeze). You have trouble breathing. These symptoms may be an emergency. Call 911 right away. Do not wait to see if the symptoms will go away. Do not drive yourself to the hospital. This information is not intended to replace advice given to you by your health care provider. Make sure you discuss any questions you have with your health care provider. Document Revised: 07/14/2022 Document Reviewed: 07/10/2021 Elsevier Patient Education  2024 [employer].

## 2023-05-11 ENCOUNTER — Encounter (HOSPITAL_COMMUNITY)
Admission: RE | Admit: 2023-05-11 | Discharge: 2023-05-11 | Disposition: A | Discharge status: Home / Self Care | Source: Ambulatory Visit | Attending: Urology | Admitting: Urology

## 2023-05-11 DIAGNOSIS — Z01810 Encounter for preprocedural cardiovascular examination: Secondary | ICD-10-CM | POA: Diagnosis not present

## 2023-05-12 ENCOUNTER — Telehealth: Payer: Self-pay | Admitting: Internal Medicine

## 2023-05-12 NOTE — Telephone Encounter (Signed)
 Pt came into [place] office and expressed that he was supposed to have surgery but they canceled his appointment. They canceled due to finding AFIB when doing pre-op work up. Pt said they told him he needed to see his cardiologist and get started on a blood thinner. I have him scheduled for July as that is our soonest appt.

## 2023-05-12 NOTE — Telephone Encounter (Signed)
 Spoke to pt who stated that yesterday at his pre- op appt for a TURBT, patient was told that he was in AFIB and that surgery would have to be put on hold until he is seen by Cardiology. EKG is available in MyChart for provider review. Pt stated that surgery was cancelled, but appointment desk shows surgery for TURBT is scheduled for 6/5 with Dr. Claretta Croft.   Rescheduled patient with B. Strader on 5/13.   Please advise.

## 2023-05-17 NOTE — Telephone Encounter (Signed)
 Compiled reports requested from [facility]

## 2023-05-17 NOTE — Telephone Encounter (Signed)
 Monitor is "lost" in transit and never made it to [facility] so therefore no final report was done on monitor there is however 15 days worth of daily reports in the zio suite I [facility]oy[facility] Chi have these scanned to you if you would like or someone can pull it up on the computer for you to view due to there are many pages.

## 2023-05-17 NOTE — Telephone Encounter (Signed)
 Spoke with [facility] he states there is no way for them to consolidate everything but he can compile all reports into 1 PDF for us  to scan.  Do you want me to do this?

## 2023-05-18 ENCOUNTER — Encounter: Payer: Self-pay | Admitting: Internal Medicine

## 2023-05-18 NOTE — Telephone Encounter (Signed)
 Received documents and provided them to haley in front office and asked her to scan into chart and route to dr.mallipeddi as soon as possible as patient has appt next with with brittany strader and Dr.Mallipeddi has been looking for results for several days.

## 2023-05-19 DIAGNOSIS — S0181XD Laceration without foreign body of other part of head, subsequent encounter: Secondary | ICD-10-CM | POA: Diagnosis not present

## 2023-05-19 DIAGNOSIS — F17210 Nicotine dependence, cigarettes, uncomplicated: Secondary | ICD-10-CM | POA: Diagnosis not present

## 2023-05-19 DIAGNOSIS — S0181XS Laceration without foreign body of other part of head, sequela: Secondary | ICD-10-CM | POA: Diagnosis not present

## 2023-05-19 DIAGNOSIS — I10 Essential (primary) hypertension: Secondary | ICD-10-CM | POA: Diagnosis not present

## 2023-05-19 DIAGNOSIS — E871 Hypo-osmolality and hyponatremia: Secondary | ICD-10-CM | POA: Diagnosis not present

## 2023-05-20 ENCOUNTER — Encounter: Admitting: Urology

## 2023-05-25 ENCOUNTER — Telehealth: Payer: Self-pay | Admitting: Student

## 2023-05-25 ENCOUNTER — Ambulatory Visit: Admitting: Student

## 2023-05-25 NOTE — Progress Notes (Deleted)
 Cardiology Office Note    Date:  05/25/2023  ID:  Dennis Mitchell, DOB February 03, 1950, MRN [id number] Cardiologist: Lasalle Pointer, MD    History of Present Illness:    Dennis Mitchell is a 73 y.o. male with past medical history of HTN, Type II DM and ascending aortic aneurysm who presents to the office today for follow-up after being told he was in atrial fibrillation at the time of his recent preoperative appointment.  He was last examined by Dr. Mallipeddi in 09/2022 and reported intermittent dizziness for the past 3 months which would occur with exertion and was not associated with rest. Reported mild dyspnea but no significant changes. Was still smoking approximately 1 pack/day. An echocardiogram and event monitor were recommended for further assessment. His echocardiogram showed a preserved EF of 60 to 65% with no regional wall motion abnormalities. He did have mild LVH, grade 1 diastolic dysfunction and normal RV function.  He did have mild MR, mild to moderate AI and mild AS. Was also noted to have mild dilatation of the aortic root at 42 mm and mild dilatation of the ascending aorta at 42 mm.  He recently presented to [facility] on 05/11/2023 for preoperative clearance for TURBT and was felt to be in atrial fibrillation and his procedure was canceled. By review of his EKG from 05/11/2023, this is most consistent with sinus rhythm with PAC's.    ROS: ***  Studies Reviewed:   EKG: EKG is*** ordered today and demonstrates ***   EKG Interpretation Date/Time:    Ventricular Rate:    PR Interval:    QRS Duration:    QT Interval:    QTC Calculation:   R Axis:      Text Interpretation:         Echocardiogram: 09/2022 IMPRESSIONS     1. Left ventricular ejection fraction, by estimation, is 60 to 65%. The  left ventricle has normal function. The left ventricle has no regional  wall motion abnormalities. There is mild concentric left ventricular  hypertrophy. Left ventricular  diastolic  parameters are consistent with Grade I diastolic dysfunction (impaired  relaxation).   2. Right ventricular systolic function is normal. The right ventricular  size is normal. Tricuspid regurgitation signal is inadequate for assessing  PA pressure.   3. Left atrial size was moderately dilated.   4. The mitral valve is degenerative. Mild mitral valve regurgitation.   5. The aortic valve is tricuspid. There is moderate calcification of the  aortic valve. Aortic valve regurgitation is mild to moderate. Mild aortic  valve stenosis. Aortic regurgitation PHT measures 436 msec. Aortic valve  mean gradient measures 10.7 mmHg.   Aortic valve Vmax measures 2.16 m/s.   6. Aortic dilatation noted. There is mild dilatation of the aortic root,  measuring 42 mm. There is mild dilatation of the ascending aorta,  measuring 42 mm.   7. The inferior vena cava is normal in size with greater than 50%  respiratory variability, suggesting right atrial pressure of 3 mmHg.   Comparison(s): No prior Echocardiogram.   Carotid Dopplers: 09/2022 Summary:  Right Carotid: Velocities in the right ICA are consistent with a 1-39%  stenosis.                Non-hemodynamically significant plaque <50% noted in the  CCA.                  The ECA appears <50% stenosed.   Left Carotid: Velocities in  the left ICA are consistent with a 1-39%  stenosis.                 Non-hemodynamically significant plaque <50% noted in the  CCA.               The ECA appears <50% stenosed.   Vertebrals:  Bilateral vertebral arteries demonstrate antegrade flow.  Subclavians: Normal flow hemodynamics were seen in bilateral subclavian               arteries.   *See table(s) above for measurements and observations.      Risk Assessment/Calculations:   {Does this patient have ATRIAL FIBRILLATION?:[phone]} No BP recorded.  {Refresh Note OR Click here to enter BP  :1}***         Physical Exam:   VS:  There were no  vitals taken for this visit.   Wt Readings from Last 3 Encounters:  05/11/23 192 lb 7.4 oz (87.3 kg)  04/19/23 192 lb 6.4 oz (87.3 kg)  09/22/22 202 lb 3.2 oz (91.7 kg)     GEN: Well nourished, well developed in no acute distress NECK: No JVD; No carotid bruits CARDIAC: ***RRR, no murmurs, rubs, gallops RESPIRATORY:  Clear to auscultation without rales, wheezing or rhonchi  ABDOMEN: Appears non-distended. No obvious abdominal masses. EXTREMITIES: No clubbing or cyanosis. No edema.  Distal pedal pulses are 2+ bilaterally.   Assessment and Plan:   1. Palpitations/PAC's - Prior monitor in 09/2022 was reviewed under Media and appears most consistent with NSR with occasional PAC's. Reviewed with Dr. Mallipeddi who will officially review later as well but did review recent EKG with her and she was in agreement that this is most consistent with NSR with PAC's.   2. HTN - ***  3. Valvular Heart Disease - Echo in 09/2022 showed mild MR, mild to moderate AI and mild AS.   4. Ascending Aortic Aneurysm - Echo in 09/2022 showed mild dilatation of the aortic root and ascending aorta measuring at 42 mm. ***   Signed, Dorma Gash, PA-C
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
## 2023-06-03 ENCOUNTER — Ambulatory Visit: Admitting: Medical

## 2023-06-10 DIAGNOSIS — E559 Vitamin D deficiency, unspecified: Secondary | ICD-10-CM | POA: Diagnosis not present

## 2023-06-10 DIAGNOSIS — I10 Essential (primary) hypertension: Secondary | ICD-10-CM | POA: Diagnosis not present

## 2023-06-10 DIAGNOSIS — E1165 Type 2 diabetes mellitus with hyperglycemia: Secondary | ICD-10-CM | POA: Diagnosis not present

## 2023-06-14 ENCOUNTER — Encounter (HOSPITAL_COMMUNITY)
Admission: RE | Admit: 2023-06-14 | Discharge: 2023-06-14 | Disposition: A | Discharge status: Home / Self Care | Source: Ambulatory Visit | Attending: Urology | Admitting: Urology

## 2023-06-14 DIAGNOSIS — R42 Dizziness and giddiness: Secondary | ICD-10-CM | POA: Diagnosis not present

## 2023-06-14 DIAGNOSIS — E7800 Pure hypercholesterolemia, unspecified: Secondary | ICD-10-CM | POA: Diagnosis not present

## 2023-06-14 DIAGNOSIS — I7123 Aneurysm of the descending thoracic aorta, without rupture: Secondary | ICD-10-CM | POA: Diagnosis not present

## 2023-06-14 DIAGNOSIS — E871 Hypo-osmolality and hyponatremia: Secondary | ICD-10-CM | POA: Diagnosis not present

## 2023-06-14 DIAGNOSIS — H6123 Impacted cerumen, bilateral: Secondary | ICD-10-CM | POA: Diagnosis not present

## 2023-06-14 DIAGNOSIS — E1165 Type 2 diabetes mellitus with hyperglycemia: Secondary | ICD-10-CM | POA: Diagnosis not present

## 2023-06-14 DIAGNOSIS — I10 Essential (primary) hypertension: Secondary | ICD-10-CM | POA: Diagnosis not present

## 2023-06-14 HISTORY — DX: Chronic obstructive pulmonary disease, unspecified: J44.9

## 2023-06-15 NOTE — Pre-Procedure Instructions (Signed)
 Attempted pre-op phone call. Left VM for him to call us back.

## 2023-06-16 ENCOUNTER — Encounter (HOSPITAL_COMMUNITY): Payer: Self-pay

## 2023-06-16 NOTE — Progress Notes (Signed)
 TURBT will need to be rescheduled.  The patient took his Jardiance this am.

## 2023-06-18 ENCOUNTER — Ambulatory Visit: Payer: Self-pay | Admitting: Internal Medicine

## 2023-06-21 ENCOUNTER — Encounter (HOSPITAL_COMMUNITY)
Admission: RE | Admit: 2023-06-21 | Discharge: 2023-06-21 | Disposition: A | Discharge status: Home / Self Care | Source: Ambulatory Visit | Attending: Urology | Admitting: Urology

## 2023-06-21 ENCOUNTER — Encounter (HOSPITAL_COMMUNITY): Payer: Self-pay

## 2023-06-21 ENCOUNTER — Other Ambulatory Visit: Payer: Self-pay

## 2023-06-21 NOTE — Patient Instructions (Signed)
 Dennis Mitchell  06/21/2023     @PREFPERIOPPHARMACY @   Your procedure is scheduled on  06/24/2023.   Report to [facility] at  0930  A.M.    Call this number if you have problems the morning of surgery:  [phone]  If you experience any cold or flu symptoms such as cough, fever, chills, shortness of breath, etc. between now and your scheduled surgery, please notify us  at the above number.   Remember:        Your last dose of jardiance should be on 06/20/2023.        DO NOT take any medications for diabetes the morning of your procedure.      Use your nebulizer and your inhaler before you come and bring your rescue inhaler with you.    Do not eat  after midnight.    You may drink clear liquids until  0730 am on 06/24/2023.    Clear liquids allowed are:                    Water , Juice (No red color; non-citric and without pulp; diabetics please choose diet or no sugar options), Carbonated beverages (diabetics please choose diet or no sugar options), Clear Tea (No creamer, milk, or cream, including half & half and powdered creamer), Black Coffee Only (No creamer, milk or cream, including half & half and powdered creamer), and Clear Sports drink (No red color; diabetics please choose diet or no sugar options)    Take these medicines the morning of surgery with A SIP OF WATER         amlodipine , citalopram , cyclobenzaprine ,(if needed), gabapentin, hydroxyzine , pantoprazole .     Do not wear jewelry, make-up or nail polish, including gel polish,  artificial nails, or any other type of covering on natural nails (fingers and  toes).  Do not wear lotions, powders, or perfumes, or deodorant.  Do not shave 48 hours prior to surgery.  Men may shave face and neck.  Do not bring valuables to the hospital.  [facility] is not responsible for any belongings or valuables.  Contacts, dentures or bridgework may not be worn into surgery.  Leave your suitcase in the car.  After  surgery it may be brought to your room.  For patients admitted to the hospital, discharge time will be determined by your treatment team.  Patients discharged the day of surgery will not be allowed to drive home and must have someone with them for 24 hours.    Special instructions:   DO NOT smoke tobacco or vape for 24 hours before your procedure.  Please read over the following fact sheets that you were given. Coughing and Deep Breathing, Surgical Site Infection Prevention, Anesthesia Post-op Instructions, and Care and Recovery After Surgery       Transurethral Resection of Bladder Tumor, Care After The following information offers guidance on how to care for yourself after your procedure. Your health care provider may also give you more specific instructions. If you have problems or questions, contact your health care provider. What can I expect after the procedure? After the procedure, it is common to have: A small amount of blood or small blood clots in your urine for up to 2 weeks. Soreness or mild pain from your catheter. After your catheter is removed, you may have mild soreness, especially when urinating. A need to urinate often. Pain in your lower abdomen. Follow these instructions at home:  Medicines  Take over-the-counter and prescription medicines only as told by your health care provider. If you were prescribed an antibiotic medicine, take it as told by your health care provider. Do not stop taking the antibiotic even if you start to feel better. Ask your health care provider if the medicine prescribed to you: Requires you to avoid driving or using machinery. Can cause constipation. You may need to take these actions to prevent or treat constipation: Drink enough fluid to keep your urine pale yellow. Take over-the-counter or prescription medicines. Eat foods that are high in fiber, such as beans, whole grains, and fresh fruits and vegetables. Limit foods that are high in  fat and processed sugars, such as fried or sweet foods. Activity  If you were given a sedative during the procedure, it can affect you for several hours. Do not drive or operate machinery until your health care provider says that it is safe. Rest as told by your health care provider. Avoid sitting for a long time without moving. Get up to take short walks every 1-2 hours. This is important to improve blood flow and breathing. Ask for help if you feel weak or unsteady. Do not lift anything that is heavier than 10 lb (4.5 kg), or the limit that you are told, until your health care provider says that it is safe. Avoid intense physical activity for as long as told by your health care provider. Do not have sex until your health care provider approves. Return to your normal activities as told by your health care provider. Ask your health care provider what activities are safe for you. General instructions If you have a catheter, follow instructions from your health care provider about caring for your catheter and your drainage bag. Do not drink alcohol for as long as told by your health care provider. This is especially important if you are taking prescription pain medicines. Do not use any products that contain nicotine  or tobacco. These products include cigarettes, chewing tobacco, and vaping devices, such as e-cigarettes. If you need help quitting, ask your health care provider. Wear compression stockings as told by your health care provider. These stockings help to prevent blood clots and reduce swelling in your legs. Keep all follow-up visits. This is important. You will need to be followed closely with regular checks of your bladder and urethra (cystoscopies) to make sure that the cancer does not come back. Contact a health care provider if: You have blood in your urine for more than 2 weeks. You become constipated. Signs of constipation may include: Having fewer than three bowel movements in a  week. Difficulty having a bowel movement. Stools that are dry, hard, or larger than normal. You have a urinary catheter in place, and you have: Spasms or pain. Problems with your catheter or your catheter is blocked. Your catheter has been taken out but you are unable to urinate. You have signs of infection, such as: Fever or chills. Cloudy or bad-smelling urine. Get help right away if: You have severe abdominal pain that gets worse or does not improve with medicine. You have a lot of large blood clots in your urine. You develop swelling or pain in your leg. You have difficulty breathing. These symptoms may be an emergency. Get help right away. Call 911. Do not wait to see if the symptoms will go away. Do not drive yourself to the hospital. Summary After your procedure, it is common to have a small amount of blood or small  blood clots in your urine, soreness or mild pain from your catheter, and pain in your lower abdomen. Take over-the-counter and prescription medicines only as told by your health care provider. Rest as told by your health care provider. Follow your health care provider's instructions about returning to normal activities. Ask what activities are safe for you. If you have a catheter, follow instructions from your health care provider about caring for your catheter and your drainage bag. This information is not intended to replace advice given to you by your health care provider. Make sure you discuss any questions you have with your health care provider. Document Revised: 01/03/2021 Document Reviewed: 01/03/2021 Elsevier Patient Education  2024 Else[employer].General Anesthesia, Adult, Care After The following information offers guidance on how to care for yourself after your procedure. Your health care provider may also give you more specific instructions. If you have problems or questions, contact your health care provider. What can I expect after the procedure? After the  procedure, it is common for people to: Have pain or discomfort at the IV site. Have nausea or vomiting. Have a sore throat or hoarseness. Have trouble concentrating. Feel cold or chills. Feel weak, sleepy, or tired (fatigue). Have soreness and body aches. These can affect parts of the body that were not involved in surgery. Follow these instructions at home: For the time period you were told by your health care provider:  Rest. Do not participate in activities where you could fall or become injured. Do not drive or use machinery. Do not drink alcohol. Do not take sleeping pills or medicines that cause drowsiness. Do not make important decisions or sign legal documents. Do not take care of children on your own. General instructions Drink enough fluid to keep your urine pale yellow. If you have sleep apnea, surgery and certain medicines can increase your risk for breathing problems. Follow instructions from your health care provider about wearing your sleep device: Anytime you are sleeping, including during daytime naps. While taking prescription pain medicines, sleeping medicines, or medicines that make you drowsy. Return to your normal activities as told by your health care provider. Ask your health care provider what activities are safe for you. Take over-the-counter and prescription medicines only as told by your health care provider. Do not use any products that contain nicotine  or tobacco. These products include cigarettes, chewing tobacco, and vaping devices, such as e-cigarettes. These can delay incision healing after surgery. If you need help quitting, ask your health care provider. Contact a health care provider if: You have nausea or vomiting that does not get better with medicine. You vomit every time you eat or drink. You have pain that does not get better with medicine. You cannot urinate or have bloody urine. You develop a skin rash. You have a fever. Get help right away  if: You have trouble breathing. You have chest pain. You vomit blood. These symptoms may be an emergency. Get help right away. Call 911. Do not wait to see if the symptoms will go away. Do not drive yourself to the hospital. Summary After the procedure, it is common to have a sore throat, hoarseness, nausea, vomiting, or to feel weak, sleepy, or fatigue. For the time period you were told by your health care provider, do not drive or use machinery. Get help right away if you have difficulty breathing, have chest pain, or vomit blood. These symptoms may be an emergency. This information is not intended to replace advice given to  you by your health care provider. Make sure you discuss any questions you have with your health care provider. Document Revised: 03/28/2021 Document Reviewed: 03/28/2021 Elsevier Patient Education  2024 Elsevier Inc.How to Use Chlorhexidine  at Home in the Shower Chlorhexidine  gluconate (CHG) is a germ-killing (antiseptic) wash that's used to clean the skin. It can get rid of the germs that normally live on the skin and can keep them away for about 24 hours. If you're having surgery, you may be told to shower with CHG at home the night before surgery. This can help lower your risk for infection. To use CHG wash in the shower, follow the steps below. Supplies needed: CHG body wash. Clean washcloth. Clean towel. How to use CHG in the shower Follow these steps unless you're told to use CHG in a different way: Start the shower. Use your normal soap and shampoo to wash your face and hair. Turn off the shower or move out of the shower stream. Pour CHG onto a clean washcloth. Do not use any type of brush or rough sponge. Start at your neck, washing your body down to your toes. Make sure you: Wash the part of your body where the surgery will be done for at least 1 minute. Do not scrub. Do not use CHG on your head or face unless your health care provider tells you to. If it  gets into your ears or eyes, rinse them well with water . Do not wash your genitals with CHG. Wash your back and under your arms. Make sure to wash skin folds. Let the CHG sit on your skin for 1-2 minutes or as long as told. Rinse your entire body in the shower, including all body creases and folds. Turn off the shower. Dry off with a clean towel. Do not put anything on your skin afterward, such as powder, lotion, or perfume. Put on clean clothes or pajamas. If it's the night before surgery, sleep in clean sheets. General tips Use CHG only as told, and follow the instructions on the label. Use the full amount of CHG as told. This is often one bottle. Do not smoke and stay away from flames after using CHG. Your skin may feel sticky after using CHG. This is normal. The sticky feeling will go away as the CHG dries. Do not use CHG: If you have a chlorhexidine  allergy or have reacted to chlorhexidine  in the past. On open wounds or areas of skin that have broken skin, cuts, or scrapes. On babies younger than 9 months of age. Contact a health care provider if: You have questions about using CHG. Your skin gets irritated or itchy. You have a rash after using CHG. You swallow any CHG. Call your local poison control center [phone] in the [place] Your eyes itch badly, or they become very red or swollen. Your hearing changes. You have trouble seeing. If you can't reach your provider, go to an urgent care or emergency room. Do not drive yourself. Get help right away if: You have swelling or tingling in your mouth or throat. You make high-pitched whistling sounds when you breathe, most often when you breathe out (wheeze). You have trouble breathing. These symptoms may be an emergency. Call 911 right away. Do not wait to see if the symptoms will go away. Do not drive yourself to the hospital. This information is not intended to replace advice given to you by your health care provider. Make  sure you discuss any questions you have with your  health care provider. Document Revised: 07/14/2022 Document Reviewed: 07/10/2021 Elsevier Patient Education  2024 [employer].

## 2023-06-23 MED ORDER — GENTAMICIN SULFATE 40 MG/ML IJ SOLN
5.0000 mg/kg | INTRAVENOUS | Status: AC
  Administered 2023-06-24: 370 mg via INTRAVENOUS
  Filled 2023-06-23: qty 9.25

## 2023-06-23 MED ORDER — GEMCITABINE CHEMO FOR BLADDER INSTILLATION 2000 MG: 2000.0000 mg | Freq: Once | INTRAVENOUS | Status: DC

## 2023-06-23 MED ORDER — GEMCITABINE CHEMO FOR BLADDER INSTILLATION 2000 MG
2000.0000 mg | Freq: Once | INTRAVENOUS | Status: DC
  Filled 2023-06-23: qty 52.6

## 2023-06-24 ENCOUNTER — Ambulatory Visit (HOSPITAL_COMMUNITY): Payer: Self-pay | Admitting: Certified Registered"

## 2023-06-24 ENCOUNTER — Ambulatory Visit (HOSPITAL_COMMUNITY)
Admission: RE | Admit: 2023-06-24 | Discharge: 2023-06-24 | Disposition: A | Discharge status: Home / Self Care | Attending: Urology | Admitting: Urology

## 2023-06-24 ENCOUNTER — Other Ambulatory Visit: Payer: Self-pay

## 2023-06-24 ENCOUNTER — Encounter (HOSPITAL_COMMUNITY): Payer: Self-pay | Admitting: Urology

## 2023-06-24 ENCOUNTER — Encounter

## 2023-06-24 ENCOUNTER — Ambulatory Visit: Admitting: Internal Medicine

## 2023-06-24 ENCOUNTER — Encounter (HOSPITAL_COMMUNITY)
Admission: RE | Disposition: A | Discharge status: Home / Self Care | Payer: Self-pay | Source: Home / Self Care | Attending: Urology

## 2023-06-24 DIAGNOSIS — F17290 Nicotine dependence, other tobacco product, uncomplicated: Secondary | ICD-10-CM | POA: Insufficient documentation

## 2023-06-24 DIAGNOSIS — F17200 Nicotine dependence, unspecified, uncomplicated: Secondary | ICD-10-CM | POA: Diagnosis not present

## 2023-06-24 DIAGNOSIS — C679 Malignant neoplasm of bladder, unspecified: Secondary | ICD-10-CM | POA: Diagnosis not present

## 2023-06-24 DIAGNOSIS — J449 Chronic obstructive pulmonary disease, unspecified: Secondary | ICD-10-CM

## 2023-06-24 DIAGNOSIS — D494 Neoplasm of unspecified behavior of bladder: Secondary | ICD-10-CM

## 2023-06-24 DIAGNOSIS — D414 Neoplasm of uncertain behavior of bladder: Secondary | ICD-10-CM | POA: Diagnosis not present

## 2023-06-24 DIAGNOSIS — F17210 Nicotine dependence, cigarettes, uncomplicated: Secondary | ICD-10-CM

## 2023-06-24 DIAGNOSIS — E119 Type 2 diabetes mellitus without complications: Secondary | ICD-10-CM | POA: Insufficient documentation

## 2023-06-24 DIAGNOSIS — G4730 Sleep apnea, unspecified: Secondary | ICD-10-CM | POA: Insufficient documentation

## 2023-06-24 DIAGNOSIS — I10 Essential (primary) hypertension: Secondary | ICD-10-CM

## 2023-06-24 DIAGNOSIS — C672 Malignant neoplasm of lateral wall of bladder: Secondary | ICD-10-CM | POA: Insufficient documentation

## 2023-06-24 DIAGNOSIS — C674 Malignant neoplasm of posterior wall of bladder: Secondary | ICD-10-CM | POA: Insufficient documentation

## 2023-06-24 DIAGNOSIS — Z9884 Bariatric surgery status: Secondary | ICD-10-CM | POA: Insufficient documentation

## 2023-06-24 DIAGNOSIS — Z85828 Personal history of other malignant neoplasm of skin: Secondary | ICD-10-CM | POA: Diagnosis not present

## 2023-06-24 HISTORY — PX: TRANSURETHRAL RESECTION OF BLADDER TUMOR: SHX2575

## 2023-06-24 HISTORY — PX: BLADDER INSTILLATION: SHX6893

## 2023-06-24 LAB — GLUCOSE, CAPILLARY
Glucose-Capillary: 106 mg/dL — ABNORMAL HIGH (ref 70–99)
Glucose-Capillary: 116 mg/dL — ABNORMAL HIGH (ref 70–99)

## 2023-06-24 SURGERY — TURBT (TRANSURETHRAL RESECTION OF BLADDER TUMOR)
Anesthesia: General | Site: Bladder

## 2023-06-24 MED ORDER — ONDANSETRON HCL 4 MG/2ML IJ SOLN
INTRAMUSCULAR | Status: AC
  Filled 2023-06-24: qty 2

## 2023-06-24 MED ORDER — SODIUM CHLORIDE (PF) 0.9 % IJ SOLN
INTRAVENOUS | Status: DC | PRN
  Administered 2023-06-24: 1000 mg via INTRAVESICAL

## 2023-06-24 MED ORDER — HYDROCODONE-ACETAMINOPHEN 5-325 MG PO TABS: 1.0000 | ORAL_TABLET | Freq: Four times a day (QID) | ORAL | 0 refills | Status: AC | PRN

## 2023-06-24 MED ORDER — LACTATED RINGERS IV SOLN: INTRAVENOUS | Status: DC | PRN

## 2023-06-24 MED ORDER — DEXMEDETOMIDINE HCL IN NACL 80 MCG/20ML IV SOLN
INTRAVENOUS | Status: DC | PRN
  Administered 2023-06-24: 12 ug via INTRAVENOUS

## 2023-06-24 MED ORDER — ROCURONIUM BROMIDE 10 MG/ML (PF) SYRINGE
PREFILLED_SYRINGE | INTRAVENOUS | Status: AC
  Filled 2023-06-24: qty 10

## 2023-06-24 MED ORDER — PROPOFOL 10 MG/ML IV BOLUS
INTRAVENOUS | Status: AC
Start: 2023-06-24 — End: 2023-06-24
  Filled 2023-06-24: qty 20

## 2023-06-24 MED ORDER — PHENYLEPHRINE 80 MCG/ML (10ML) SYRINGE FOR IV PUSH (FOR BLOOD PRESSURE SUPPORT)
PREFILLED_SYRINGE | INTRAVENOUS | Status: AC
  Filled 2023-06-24: qty 10

## 2023-06-24 MED ORDER — OXYCODONE HCL 5 MG PO TABS: 5.0000 mg | ORAL_TABLET | Freq: Once | ORAL | Status: DC | PRN

## 2023-06-24 MED ORDER — FENTANYL CITRATE (PF) 100 MCG/2ML IJ SOLN
INTRAMUSCULAR | Status: DC | PRN
  Administered 2023-06-24: 50 ug via INTRAVENOUS

## 2023-06-24 MED ORDER — PHENYLEPHRINE HCL-NACL 20-0.9 MG/250ML-% IV SOLN
INTRAVENOUS | Status: AC
  Filled 2023-06-24: qty 250

## 2023-06-24 MED ORDER — PROPOFOL 10 MG/ML IV BOLUS
INTRAVENOUS | Status: AC
  Filled 2023-06-24: qty 20

## 2023-06-24 MED ORDER — FENTANYL CITRATE (PF) 100 MCG/2ML IJ SOLN
INTRAMUSCULAR | Status: AC
Start: 2023-06-24 — End: 2023-06-24
  Filled 2023-06-24: qty 2

## 2023-06-24 MED ORDER — CHLORHEXIDINE GLUCONATE 0.12 % MT SOLN: 15.0000 mL | Freq: Once | OROMUCOSAL | Status: DC

## 2023-06-24 MED ORDER — PHENYLEPHRINE HCL-NACL 20-0.9 MG/250ML-% IV SOLN: INTRAVENOUS | Status: DC | PRN

## 2023-06-24 MED ORDER — ROCURONIUM BROMIDE 10 MG/ML (PF) SYRINGE
PREFILLED_SYRINGE | INTRAVENOUS | Status: DC | PRN
  Administered 2023-06-24: 80 mg via INTRAVENOUS

## 2023-06-24 MED ORDER — SUGAMMADEX SODIUM 200 MG/2ML IV SOLN
INTRAVENOUS | Status: DC | PRN
  Administered 2023-06-24: 350 mg via INTRAVENOUS

## 2023-06-24 MED ORDER — CHLORHEXIDINE GLUCONATE 0.12 % MT SOLN
OROMUCOSAL | Status: AC
  Filled 2023-06-24: qty 15

## 2023-06-24 MED ORDER — ONDANSETRON HCL 4 MG/2ML IJ SOLN
INTRAMUSCULAR | Status: DC | PRN
  Administered 2023-06-24: 4 mg via INTRAVENOUS

## 2023-06-24 MED ORDER — SODIUM CHLORIDE 0.9 % IV SOLN: 12.5000 mg | INTRAVENOUS | Status: DC | PRN

## 2023-06-24 MED ORDER — PHENYLEPHRINE HCL-NACL 20-0.9 MG/250ML-% IV SOLN
INTRAVENOUS | Status: DC | PRN
  Administered 2023-06-24: 20 ug/min via INTRAVENOUS

## 2023-06-24 MED ORDER — LACTATED RINGERS IV SOLN: INTRAVENOUS | Status: DC

## 2023-06-24 MED ORDER — PHENYLEPHRINE 80 MCG/ML (10ML) SYRINGE FOR IV PUSH (FOR BLOOD PRESSURE SUPPORT)
PREFILLED_SYRINGE | INTRAVENOUS | Status: DC | PRN
  Administered 2023-06-24: 80 ug via INTRAVENOUS

## 2023-06-24 MED ORDER — FENTANYL CITRATE PF 50 MCG/ML IJ SOSY: 25.0000 ug | PREFILLED_SYRINGE | INTRAMUSCULAR | Status: DC | PRN

## 2023-06-24 MED ORDER — ORAL CARE MOUTH RINSE: 15.0000 mL | Freq: Once | OROMUCOSAL | Status: DC

## 2023-06-24 MED ORDER — SODIUM CHLORIDE 0.9 % IR SOLN
Status: DC | PRN
  Administered 2023-06-24: 3000 mL

## 2023-06-24 MED ORDER — OXYCODONE HCL 5 MG/5ML PO SOLN: 5.0000 mg | Freq: Once | ORAL | Status: DC | PRN

## 2023-06-24 MED ORDER — LIDOCAINE 2% (20 MG/ML) 5 ML SYRINGE
INTRAMUSCULAR | Status: AC
  Filled 2023-06-24: qty 5

## 2023-06-24 MED ORDER — KETOROLAC TROMETHAMINE 30 MG/ML IJ SOLN
INTRAMUSCULAR | Status: AC
  Filled 2023-06-24: qty 2

## 2023-06-24 MED ORDER — PROPOFOL 500 MG/50ML IV EMUL
INTRAVENOUS | Status: DC | PRN
  Administered 2023-06-24: 150 mg via INTRAVENOUS

## 2023-06-24 MED ORDER — DEXAMETHASONE SODIUM PHOSPHATE 10 MG/ML IJ SOLN
INTRAMUSCULAR | Status: AC
  Filled 2023-06-24: qty 1

## 2023-06-24 MED ORDER — STERILE WATER FOR IRRIGATION IR SOLN
Status: DC | PRN
  Administered 2023-06-24: 500 mL

## 2023-06-24 MED ORDER — LIDOCAINE 2% (20 MG/ML) 5 ML SYRINGE
INTRAMUSCULAR | Status: DC | PRN
  Administered 2023-06-24: 80 mg via INTRAVENOUS

## 2023-06-24 SURGICAL SUPPLY — 19 items
BAG DRAIN URO TABLE W/ADPT NS (BAG) ×2 IMPLANT
BAG HAMPER (MISCELLANEOUS) ×2 IMPLANT
BAG URINE DRAIN 2000ML AR STRL (UROLOGICAL SUPPLIES) ×2 IMPLANT
CATH FOLEY 2WAY SLVR 30CC 20FR (CATHETERS) IMPLANT
ELECTRODE LOOP 22F BIPOLAR SML (ELECTROSURGICAL) ×2 IMPLANT
GLOVE BIO SURGEON STRL SZ8 (GLOVE) ×2 IMPLANT
GLOVE BIOGEL PI IND STRL 7.0 (GLOVE) ×4 IMPLANT
GOWN STRL REUS W/TWL LRG LVL3 (GOWN DISPOSABLE) ×2 IMPLANT
GOWN STRL REUS W/TWL XL LVL3 (GOWN DISPOSABLE) ×2 IMPLANT
KIT CHEMO SPILL (MISCELLANEOUS) ×2 IMPLANT
KIT TURNOVER CYSTO (KITS) ×2 IMPLANT
PACK CYSTO (CUSTOM PROCEDURE TRAY) ×2 IMPLANT
PAD ARMBOARD POSITIONER FOAM (MISCELLANEOUS) ×2 IMPLANT
PLUG CATH AND CAP STER (CATHETERS) IMPLANT
POSITIONER HEAD 8X9X4 ADT (SOFTGOODS) ×2 IMPLANT
SOL .9 NS 3000ML IRR UROMATIC (IV SOLUTION) ×4 IMPLANT
SYR 30ML LL (SYRINGE) ×2 IMPLANT
SYRINGE TOOMEY IRRIG 70ML (MISCELLANEOUS) ×2 IMPLANT
TOWEL OR 17X26 4PK STRL BLUE (TOWEL DISPOSABLE) ×2 IMPLANT

## 2023-06-24 NOTE — Anesthesia Preprocedure Evaluation (Addendum)
 Anesthesia Evaluation  Patient identified by MRN, date of birth, ID band Patient awake    Reviewed: Allergy & Precautions, NPO status , Patient's Chart, lab work & pertinent test results  Airway Mallampati: IV  TM Distance: >3 FB Neck ROM: Limited   Comment: ACDF Tracheostomy  Dental  (+) Dental Advisory Given, Missing, Poor Dentition Many missing teeth especially upper:   Pulmonary shortness of breath, with exertion and lying, sleep apnea , pneumonia, resolved, COPD, Current Smoker and Patient abstained from smoking. H/o tracheostomy   Pulmonary exam normal breath sounds clear to auscultation       Cardiovascular Exercise Tolerance: Poor hypertension, Pt. on medications Normal cardiovascular exam Rhythm:Regular Rate:Normal  ACDF   Neuro/Psych  PSYCHIATRIC DISORDERS Anxiety Depression     Neuromuscular disease    GI/Hepatic hiatal hernia,,,(+)     substance abuse  alcohol useGastric bypass   Endo/Other  diabetes, Well Controlled, Type 2    Renal/GU Renal disease  negative genitourinary   Musculoskeletal  (+) Arthritis , Osteoarthritis,    Abdominal   Peds negative pediatric ROS (+)  Hematology negative hematology ROS (+)   Anesthesia Other Findings ACDF BPPV Chronic back pain  Reproductive/Obstetrics negative OB ROS                             Anesthesia Physical Anesthesia Plan  ASA: 3  Anesthesia Plan: General   Post-op Pain Management: Dilaudid  IV   Induction: Intravenous  PONV Risk Score and Plan: 3 and Ondansetron , Dexamethasone  and Midazolam   Airway Management Planned: LMA  Additional Equipment: None  Intra-op Plan:   Post-operative Plan: Extubation in OR  Informed Consent: I have reviewed the patients History and Physical, chart, labs and discussed the procedure including the risks, benefits and alternatives for the proposed anesthesia with the patient or  authorized representative who has indicated his/her understanding and acceptance.     Dental advisory given  Plan Discussed with: CRNA and Surgeon  Anesthesia Plan Comments: (H/o tracheostomy, use ETT 7.0 or smaller size for intubation.  Limited ROM neck.  MP 4.  Try 4 LMA and if doesn't fit then use glide scope and 6.5 or 7 ETT.)        Anesthesia Quick Evaluation

## 2023-06-24 NOTE — Transfer of Care (Addendum)
 Immediate Anesthesia Transfer of Care Note  Patient: Dennis Mitchell  Procedure(s) Performed: TURBT (TRANSURETHRAL RESECTION OF BLADDER TUMOR) (Bladder) INSTILLATION, BLADDER  Patient Location: PACU  Anesthesia Type:General  Level of Consciousness: awake and patient cooperative  Airway & Oxygen Therapy: Patient Spontanous Breathing and Patient connected to nasal cannula oxygen  Post-op Assessment: Report given to RN and Post -op Vital signs reviewed and stable  Post vital signs: Reviewed and stable  Last Vitals:  Vitals Value Taken Time  BP 154/63 06/24/23 14:01  Temp 36.5 06/24/23   1400  Pulse 63 06/24/23 14:09  Resp 15 06/24/23 14:09  SpO2 99 % 06/24/23 14:09  Vitals shown include unfiled device data.  Last Pain:  Vitals:   06/24/23 1053  TempSrc: Oral  PainSc: 0-No pain      Patients Stated Pain Goal: 6 (06/24/23 1053)  Complications: No reports of pain at present. Patient reports discomfort related to the urge to void. Patient aware the catheter is in place and medication is instilled in bladder.PACU RN aware.

## 2023-06-24 NOTE — Anesthesia Postprocedure Evaluation (Signed)
 Anesthesia Post Note  Patient: Dennis Mitchell  Procedure(s) Performed: TURBT (TRANSURETHRAL RESECTION OF BLADDER TUMOR) (Bladder) INSTILLATION, BLADDER  Patient location during evaluation: PACU Anesthesia Type: General Level of consciousness: awake and alert Pain management: pain level controlled Vital Signs Assessment: post-procedure vital signs reviewed and stable Respiratory status: spontaneous breathing, nonlabored ventilation, respiratory function stable and patient connected to nasal cannula oxygen Cardiovascular status: blood pressure returned to baseline and stable Postop Assessment: no apparent nausea or vomiting Anesthetic complications: no   There were no known notable events for this encounter.   Last Vitals:  Vitals:   06/24/23 1415 06/24/23 1430  BP: (!) 175/77 (!) 158/68  Pulse: 67 (!) 56  Resp: 14 15  Temp:    SpO2: 100% 98%    Last Pain:  Vitals:   06/24/23 1400  TempSrc:   PainSc: 0-No pain                 Logon Uttech L Dmani Mizer

## 2023-06-24 NOTE — Op Note (Signed)
 Preoperative diagnosis: bladder tumor  Postoperative diagnosis: Same  Procedure: 1 cystoscopy 2. Transurethral resection of bladder tumor, small 3. Instillation of bladder chemotherapy agent  Attending: Thomes Flicker  Anesthesia: General  Estimated blood loss: Minimal  Drains: 20 French foley  Specimens: bladder tumors  Antibiotics: ancef  Findings: two 1cm papillary right lateral wall  and posterior wall tumor.  Ureteral orifices in normal anatomic location.   Indications: Patient is a 73 year old male with a history of recurrent bladder tumors.   After discussing treatment options, they decided proceed with transurethral resection of a bladder tumor.  Procedure in detail: The patient was brought to the operating room and a brief timeout was done to ensure correct patient, correct procedure, correct site.  General anesthesia was administered patient was placed in dorsal lithotomy position.  Their genitalia was then prepped and draped in usual sterile fashion.  A rigid 22 French cystoscope was passed in the urethra and the bladder.  Bladder was inspected and we noted two 1cm bladder tumors.  the ureteral orifices were in the normal orthotopic locations.  We then removed the cystoscope and placed a resectoscope into the bladder. Using the bipolar resectoscope we removed the bladder tumors down to the base. Hemostasis was then obtained with electrocautery. We then removed the bladder tumor chips and sent them for pathology. We then re-inspected the bladder and found no residual bleeding.  the bladder was then drained, a 20 French foley was placed, 2g of gemcitabine was instilled into the bladder and this concluded the procedure which was well tolerated by patient.  Complications: None  Condition: Stable, extubated, transferred to PACU  Plan: Patient is to have their foley drained in 1 hour. They will be discharged home and followup in 7 days for foley catheter removal and pathology  discussion.\

## 2023-06-24 NOTE — Anesthesia Procedure Notes (Addendum)
 Procedure Name: Intubation Date/Time: 06/24/2023 1:24 PM  Performed by: Juluis Ok, CRNAPre-anesthesia Checklist: Patient identified, Emergency Drugs available, Suction available and Patient being monitored Patient Re-evaluated:Patient Re-evaluated prior to induction Oxygen Delivery Method: Circle system utilized Preoxygenation: Pre-oxygenation with 100% oxygen Induction Type: IV induction Ventilation: Mask ventilation without difficulty and Oral airway inserted - appropriate to patient size Laryngoscope Size: Glidescope and 3 Grade View: Grade I Tube type: Oral Tube size: 6.5 mm Number of attempts: 1 Airway Equipment and Method: Rigid stylet and Video-laryngoscopy Placement Confirmation: ETT inserted through vocal cords under direct vision, positive ETCO2, CO2 detector and breath sounds checked- equal and bilateral Secured at: 23 cm Tube secured with: Tape Dental Injury: Teeth and Oropharynx as per pre-operative assessment  Comments: Intubated by Missy Amos, SRNA. Previous tracheostomy and neck surgery, atraumatic intubation with use of Glidescope size 3 blade. Missing dentition on upper and lowers. Lips and teeth remain in preoperative condition.

## 2023-06-24 NOTE — H&P (Signed)
 Urology Admission H&P  Chief Complaint: Bladder tumor  History of Present Illness: Dennis Mitchell is a 73yo here for bladder tumor resection for recurrent bladder tumors. He denies any significant LUTS. No other complaints today  Past Medical History:  Diagnosis Date   Anxiety    Arthritis    Back pain    COPD (chronic obstructive pulmonary disease) (HCC)    Depression    Diabetes mellitus    due to weight loss, lost 70 lbs. due to gastric bypass    Dysphagia    Fatigue    History of hiatal hernia    Hypercholesteremia    Hypertension    Neuropathic pain    Skin cancer of face    Sleep apnea    Cpap   Trauma 2010   multiple traumas- neck, back, ribs, collar bones, - resulted in prolonged hosp. & rehab stay , with trach   Past Surgical History:  Procedure Laterality Date   ANTERIOR CERVICAL DECOMP/DISCECTOMY FUSION N/A 12/26/2018   Procedure: Anterior Cervical Decompression Fusion - Cervical Four - Cervical Five - Cervical Five - Cervical Six - Cervical Six - Cervical Seven;  Surgeon: Agustina Aldrich, MD;  Location: [facility];  Service: Neurosurgery;  Laterality: N/A;  Anterior Cervical Decompression Fusion - Cervical Four - Cervical Five - Cervical Five - Cervical Six - Cervical Six - Cervical Seven   BREATH TEK H PYLORI N/A 03/01/2014   Procedure: BREATH TEK H PYLORI;  Surgeon: Juanita Norlander, MD;  Location: [facility];  Service: General;  Laterality: N/A;   COLONOSCOPY N/A 09/20/2013   Procedure: COLONOSCOPY;  Surgeon: Ruby Corporal, MD;  Location: [facility];  Service: Endoscopy;  Laterality: N/A;  930   COLONOSCOPY N/A 04/28/2023   Procedure: COLONOSCOPY;  Surgeon: Urban Garden, MD;  Location: [facility];  Service: Gastroenterology;  Laterality: N/A;  1:15PM; ASA 1   CYSTOSCOPY W/ URETERAL STENT PLACEMENT Right 07/02/2021   Procedure: CYSTOSCOPY WITH  BILATERAL RETROGRADE PYELOGRAM/URETERAL STENT PLACEMENT;  Surgeon: Mellie Sprinkle., MD;  Location: [facility];   Service: Urology;  Laterality: Right;   DOG BITE     LAPAROSCOPIC GASTRIC SLEEVE RESECTION WITH HIATAL HERNIA REPAIR N/A 03/19/2015   Procedure: LAPAROSCOPIC GASTRIC SLEEVE RESECTION WITH  HIATAL HERNIA REPAIR;  Surgeon: Juanita Norlander, MD;  Location: [facility];  Service: General;  Laterality: N/A;   TRACHEOSTOMY  2010   TRANSURETHRAL RESECTION OF BLADDER TUMOR N/A 07/02/2021   Procedure: TRANSURETHRAL RESECTION OF BLADDER TUMOR (TURBT);  Surgeon: Mellie Sprinkle., MD;  Location: [facility];  Service: Urology;  Laterality: N/A;   UPPER GI ENDOSCOPY  03/19/2015   Procedure: UPPER GI ENDOSCOPY;  Surgeon: Juanita Norlander, MD;  Location: [facility];  Service: General;;    Home Medications:  Current Facility-Administered Medications  Medication Dose Route Frequency Provider Last Rate Last Admin   chlorhexidine  (PERIDEX ) 0.12 % solution            gemcitabine (GEMZAR) chemo syringe for bladder instillation 2,000 mg  2,000 mg Bladder Instillation Once Gyselle Matthew, Arden Beck, MD       gentamicin (GARAMYCIN) 370 mg in dextrose  5 % 100 mL IVPB  5 mg/kg (Adjusted) Intravenous 30 min Pre-Op Harvey Matlack, Arden Beck, MD       Allergies:  Allergies  Allergen Reactions   Bee Venom Shortness Of Breath   Aspirin Swelling    Lip swelling   Lisinopril  Other (See Comments)    unknown   Penicillins Hives, Swelling and Rash  Lip swelling    Family History  Problem Relation Age of Onset   Hypertension Father    Hypertension Sister    Hypertension Brother    Sleep apnea Neg Hx    Social History:  reports that he has been smoking e-cigarettes and cigarettes. He has never used smokeless tobacco. He reports current alcohol use of about 8.0 standard drinks of alcohol per week. He reports that he does not use drugs.  Review of Systems  All other systems reviewed and are negative.   Physical Exam:  Vital signs in last 24 hours: Temp:  [97.8 F (36.6 C)] 97.8 F (36.6 C) (06/12 1053) Pulse Rate:  [65] 65 (06/12  1053) Resp:  [14] 14 (06/12 1053) BP: (181)/(77) 181/77 (06/12 1053) SpO2:  [99 %] 99 % (06/12 1053) Physical Exam Vitals reviewed.  Constitutional:      Appearance: Normal appearance.  HENT:     Head: Normocephalic and atraumatic.     Nose: Nose normal. No congestion.     Mouth/Throat:     Mouth: Mucous membranes are moist.   Eyes:     Extraocular Movements: Extraocular movements intact.     Pupils: Pupils are equal, round, and reactive to light.    Cardiovascular:     Rate and Rhythm: Normal rate and regular rhythm.  Pulmonary:     Effort: Pulmonary effort is normal. No respiratory distress.  Abdominal:     General: Abdomen is flat. There is no distension.   Musculoskeletal:        General: No swelling. Normal range of motion.     Cervical back: Normal range of motion and neck supple.   Skin:    General: Skin is warm and dry.   Neurological:     General: No focal deficit present.     Mental Status: He is alert and oriented to person, place, and time.   Psychiatric:        Mood and Affect: Mood normal.        Behavior: Behavior normal.        Thought Content: Thought content normal.        Judgment: Judgment normal.     Laboratory Data:  Results for orders placed or performed during the hospital encounter of 06/24/23 (from the past 24 hours)  Glucose, capillary     Status: Abnormal   Collection Time: 06/24/23 10:27 AM  Result Value Ref Range   Glucose-Capillary 106 (H) 70 - 99 mg/dL   No results found for this or any previous visit (from the past 240 hours). Creatinine: No results for input(s): CREATININE in the last 168 hours. Baseline Creatinine:   Impression/Assessment:  73yo with bladder tumors  Plan:  The risks/benefits/alternatives to transurethral resection of a bladder tumor was explained to the patient and he understands and wishes to proceed with surgery  Dennis Mitchell 06/24/2023, 12:41 PM

## 2023-06-25 ENCOUNTER — Encounter (HOSPITAL_COMMUNITY): Payer: Self-pay | Admitting: Urology

## 2023-06-28 LAB — SURGICAL PATHOLOGY

## 2023-06-29 ENCOUNTER — Encounter: Payer: Self-pay | Admitting: Urology

## 2023-06-30 ENCOUNTER — Ambulatory Visit: Admitting: Urology

## 2023-06-30 ENCOUNTER — Encounter: Payer: Self-pay | Admitting: Urology

## 2023-06-30 VITALS — BP 166/73 | HR 93

## 2023-06-30 DIAGNOSIS — C679 Malignant neoplasm of bladder, unspecified: Secondary | ICD-10-CM | POA: Diagnosis not present

## 2023-06-30 MED ORDER — CIPROFLOXACIN HCL 500 MG PO TABS
500.0000 mg | ORAL_TABLET | Freq: Once | ORAL | Status: AC
  Administered 2023-06-30: 500 mg via ORAL

## 2023-06-30 NOTE — Progress Notes (Cosign Needed)
 Fill and Pull Catheter Removal  Patient is present today for a catheter removal.  of sterile water  was instilled into the bladder when the patient felt the urge to urinate. 30ml of water  was then drained from the balloon.  A 20FR foley cath was removed from the bladder no complications were noted .  Foley catheter intact and time of removal. Patient as then given some time to void on their own.  Patient can void  on their own after some time.  Patient tolerated well.  One oral prophylactic antibiotic given per MD orders  Performed by: Wyonia Hefty, CMA  Follow up/ Additional notes: MD to see after

## 2023-06-30 NOTE — Progress Notes (Signed)
 06/30/2023 11:15 AM   Dennis Mitchell 06/06/1950 [id number]  Referring provider: Omie Bickers, MD [address] Dennis Mitchell,  [place] [address]  Followup bladder tumor resection   HPI: Dennis Mitchell is a 73yo here for followup after bladder tumor resection. Pathology PUNLMP. Voiding trial passed today. No gross hematuria.    PMH: Past Medical History:  Diagnosis Date   Anxiety    Arthritis    Back pain    COPD (chronic obstructive pulmonary disease) (HCC)    Depression    Diabetes mellitus    due to weight loss, lost 70 lbs. due to gastric bypass    Dysphagia    Fatigue    History of hiatal hernia    Hypercholesteremia    Hypertension    Neuropathic pain    Skin cancer of face    Sleep apnea    Cpap   Trauma 2010   multiple traumas- neck, back, ribs, collar bones, - resulted in prolonged hosp. & rehab stay , with trach    Surgical History: Past Surgical History:  Procedure Laterality Date   ANTERIOR CERVICAL DECOMP/DISCECTOMY FUSION N/A 12/26/2018   Procedure: Anterior Cervical Decompression Fusion - Cervical Four - Cervical Five - Cervical Five - Cervical Six - Cervical Six - Cervical Seven;  Surgeon: Agustina Aldrich, MD;  Location: [facility];  Service: Neurosurgery;  Laterality: N/A;  Anterior Cervical Decompression Fusion - Cervical Four - Cervical Five - Cervical Five - Cervical Six - Cervical Six - Cervical Seven   BLADDER INSTILLATION N/A 06/24/2023   Procedure: INSTILLATION, BLADDER;  Surgeon: Marco Severs, MD;  Location: [facility];  Service: Urology;  Laterality: N/A;  gemcitabine    BREATH TEK H PYLORI N/A 03/01/2014   Procedure: BREATH TEK H PYLORI;  Surgeon: Juanita Norlander, MD;  Location: [facility];  Service: General;  Laterality: N/A;   COLONOSCOPY N/A 09/20/2013   Procedure: COLONOSCOPY;  Surgeon: Ruby Corporal, MD;  Location: [facility];  Service: Endoscopy;  Laterality: N/A;  930   COLONOSCOPY N/A 04/28/2023   Procedure: COLONOSCOPY;  Surgeon: Urban Garden, MD;  Location: [facility];  Service: Gastroenterology;  Laterality: N/A;  1:15PM; ASA 1   CYSTOSCOPY W/ URETERAL STENT PLACEMENT Right 07/02/2021   Procedure: CYSTOSCOPY WITH  BILATERAL RETROGRADE PYELOGRAM/URETERAL STENT PLACEMENT;  Surgeon: Mellie Sprinkle., MD;  Location: [facility];  Service: Urology;  Laterality: Right;   DOG BITE     LAPAROSCOPIC GASTRIC SLEEVE RESECTION WITH HIATAL HERNIA REPAIR N/A 03/19/2015   Procedure: LAPAROSCOPIC GASTRIC SLEEVE RESECTION WITH  HIATAL HERNIA REPAIR;  Surgeon: Juanita Norlander, MD;  Location: [facility];  Service: General;  Laterality: N/A;   TRACHEOSTOMY  2010   TRANSURETHRAL RESECTION OF BLADDER TUMOR N/A 07/02/2021   Procedure: TRANSURETHRAL RESECTION OF BLADDER TUMOR (TURBT);  Surgeon: Mellie Sprinkle., MD;  Location: [facility];  Service: Urology;  Laterality: N/A;   TRANSURETHRAL RESECTION OF BLADDER TUMOR N/A 06/24/2023   Procedure: TURBT (TRANSURETHRAL RESECTION OF BLADDER TUMOR);  Surgeon: Marco Severs, MD;  Location: [facility];  Service: Urology;  Laterality: N/A;   UPPER GI ENDOSCOPY  03/19/2015   Procedure: UPPER GI ENDOSCOPY;  Surgeon: Juanita Norlander, MD;  Location: [facility];  Service: General;;    Home Medications:  Allergies as of 06/30/2023       Reactions   Bee Venom Shortness Of Breath   Aspirin Swelling   Lip swelling   Lisinopril  Other (See Comments)   unknown  Penicillins Hives, Swelling, Rash   Lip swelling        Medication List        Accurate as of June 30, 2023 11:15 AM. If you have any questions, ask your nurse or doctor.          albuterol  1.25 MG/3ML nebulizer solution Commonly known as: ACCUNEB  Take 1 ampule by nebulization every 6 (six) hours as needed for wheezing or shortness of breath.   albuterol  108 (90 Base) MCG/ACT inhaler Commonly known as: VENTOLIN  HFA Inhale 1-2 puffs into the lungs every 6 (six) hours as needed for wheezing or shortness of breath.   amLODipine  10 MG  tablet Commonly known as: NORVASC  Take 1 tablet (10 mg total) by mouth daily.   B-complex with vitamin C tablet Take 1 tablet by mouth daily.   Breztri Aerosphere 160-9-4.8 MCG/ACT Aero inhaler Generic drug: budesonide-glycopyrrolate -formoterol Inhale 2 puffs into the lungs in the morning and at bedtime.   citalopram  40 MG tablet Commonly known as: CELEXA  Take 1 tablet (40 mg total) by mouth daily.   cyclobenzaprine  10 MG tablet Commonly known as: FLEXERIL  Take 1 tablet (10 mg total) by mouth 3 (three) times daily as needed for muscle spasms.   Debrox 6.5 % OTIC solution Generic drug: carbamide peroxide Place 5 drops into both ears 2 (two) times daily as needed (ear wax buildup).   diclofenac  sodium 1 % Gel Commonly known as: Voltaren  APPLY TOPICALLY TWICE DAILY TO AFFECTED AREA AS NEEDED FOR PAIN What changed:  how much to take how to take this when to take this reasons to take this additional instructions   EPINEPHrine  0.3 mg/0.3 mL Soaj injection Commonly known as: EPI-PEN Inject 0.3 mg into the muscle as needed for anaphylaxis.   EQ VISION FORMULA 50+ PO Take 1 tablet by mouth in the morning.   ferrous sulfate 325 (65 FE) MG tablet Take 325 mg by mouth in the morning.   gabapentin 400 MG capsule Commonly known as: NEURONTIN Take 400 mg by mouth 3 (three) times daily as needed (pain.).   hydrochlorothiazide  25 MG tablet Commonly known as: HYDRODIURIL  Take 25 mg by mouth in the morning.   hydrOXYzine  25 MG capsule Commonly known as: VISTARIL  Take 1 capsule (25 mg total) by mouth every 8 (eight) hours as needed for itching.   Jardiance 10 MG Tabs tablet Generic drug: empagliflozin Take 10 mg by mouth in the morning.   lisinopril  20 MG tablet Commonly known as: ZESTRIL  Take 1 tablet (20 mg total) by mouth daily.   meclizine  25 MG tablet Commonly known as: ANTIVERT  Take 1 tablet (25 mg total) by mouth 3 (three) times daily as needed for dizziness.    metFORMIN  500 MG tablet Commonly known as: GLUCOPHAGE  Take 500 mg by mouth in the morning.   pantoprazole  40 MG tablet Commonly known as: PROTONIX  Take 1 tablet (40 mg total) by mouth daily.   potassium chloride  10 MEQ tablet Commonly known as: KLOR-CON  M Take 10 mEq by mouth in the morning.   psyllium 95 % Pack Commonly known as: HYDROCIL/METAMUCIL Take 1 packet by mouth daily.   sildenafil  100 MG tablet Commonly known as: VIAGRA  Take 100 mg by mouth daily as needed.   traZODone  50 MG tablet Commonly known as: DESYREL  Take 50 mg by mouth at bedtime.   TYLENOL  ARTHRITIS PAIN PO Take 650-1,300 mg by mouth every 8 (eight) hours as needed (pain).   VITAMIN D3 PO Take 1 tablet by mouth daily.  Allergies:  Allergies  Allergen Reactions   Bee Venom Shortness Of Breath   Aspirin Swelling    Lip swelling   Lisinopril  Other (See Comments)    unknown   Penicillins Hives, Swelling and Rash    Lip swelling    Family History: Family History  Problem Relation Age of Onset   Hypertension Father    Hypertension Sister    Hypertension Brother    Sleep apnea Neg Hx     Social History:  reports that he has been smoking e-cigarettes and cigarettes. He has never used smokeless tobacco. He reports current alcohol use of about 8.0 standard drinks of alcohol per week. He reports that he does not use drugs.  ROS: All other review of systems were reviewed and are negative except what is noted above in HPI  Physical Exam: BP (!) 166/73   Pulse 93   Constitutional:  Alert and oriented, No acute distress. HEENT: [place] AT, moist mucus membranes.  Trachea midline, no masses. Cardiovascular: No clubbing, cyanosis, or edema. Respiratory: Normal respiratory effort, no increased work of breathing. GI: Abdomen is soft, nontender, nondistended, no abdominal masses GU: No CVA tenderness.  Lymph: No cervical or inguinal lymphadenopathy. Skin: No rashes, bruises or suspicious  lesions. Neurologic: Grossly intact, no focal deficits, moving all 4 extremities. Psychiatric: Normal mood and affect.  Laboratory Data: Lab Results  Component Value Date   WBC 10.0 06/27/2022   HGB 12.8 (L) 06/27/2022   HCT 37.1 (L) 06/27/2022   MCV 91.2 06/27/2022   PLT 226 06/27/2022    Lab Results  Component Value Date   CREATININE 0.97 04/27/2023    Lab Results  Component Value Date   PSA 0.24 08/07/2014   PSA 0.25 06/19/2013   PSA 0.46 04/13/2012    Lab Results  Component Value Date   TESTOSTERONE  537 02/12/2020    Lab Results  Component Value Date   HGBA1C 5.7 (H) 06/30/2021    Urinalysis    Component Value Date/Time   COLORURINE YELLOW 06/26/2022 2219   APPEARANCEUR Clear 04/07/2023 0934   LABSPEC 1.012 06/26/2022 2219   PHURINE 7.0 06/26/2022 2219   GLUCOSEU 3+ (A) 04/07/2023 0934   HGBUR NEGATIVE 06/26/2022 2219   BILIRUBINUR Negative 04/07/2023 0934   KETONESUR NEGATIVE 06/26/2022 2219   PROTEINUR Trace 04/07/2023 0934   PROTEINUR 100 (A) 06/26/2022 2219   UROBILINOGEN 0.2 01/01/2011 0440   NITRITE Negative 04/07/2023 0934   NITRITE NEGATIVE 06/26/2022 2219   LEUKOCYTESUR Negative 04/07/2023 0934   LEUKOCYTESUR NEGATIVE 06/26/2022 2219    Lab Results  Component Value Date   LABMICR Comment 04/07/2023   WBCUA 0-5 10/02/2021   LABEPIT 0-10 10/02/2021   MUCUS Present 08/01/2021   BACTERIA NONE SEEN 06/26/2022    Pertinent Imaging:  Results for orders placed during the hospital encounter of 12/21/08  DG Abd 1 View  Narrative Clinical Data: Panda feeding tube advancement.  ABDOMEN - 1 VIEW  Comparison: 12/27/2008  Findings: Three fluoroscopic spot images are provided.  These were printed on paper and then scanned into the PACS under the document management function.  The three images demonstrate apparent advancement of a feeding tube into the third portion of the duodenum, with contrast in the bowel.  IMPRESSION:  1.   Scanned paper fluoroscopic images appear to demonstrate advancement of a Panda feeding tube into the third portion of the small bowel.  Provider: Coleman Daughters, Alexis Idol  No results found for this or any previous visit.  No  results found for this or any previous visit.  No results found for this or any previous visit.  Results for orders placed during the hospital encounter of 05/05/21  US  RENAL  Narrative CLINICAL DATA:  Microscopic hematuria.  EXAM: RENAL / URINARY TRACT ULTRASOUND COMPLETE  COMPARISON:  None.  FINDINGS: Right Kidney:  Renal measurements: 11.7 x 6.7 x 5.6 cm = volume: 229.6 mL. Echogenicity within normal limits. No mass or hydronephrosis visualized.  Left Kidney:  Renal measurements: 11.6 x 6.4 x 5.7 cm = volume: 223.7 mL. Echogenicity within normal limits. No mass or hydronephrosis visualized.  Bladder:  There is suggestion of a small nodular mass within the bladder measuring up to 1.5 x 1.0 x 1.8 cm.  Other:  None.  IMPRESSION: Possible mass within the bladder. Recommend further evaluation with either pre and post contrast-enhanced CT or direct visualization.  These results will be called to the ordering clinician or representative by the Radiologist Assistant, and communication documented in the PACS or [employer].   Electronically Signed By: Jone Neither M.D. On: 05/06/2021 13:05  No results found for this or any previous visit.  Results for orders placed during the hospital encounter of 06/06/21  CT HEMATURIA WORKUP  Narrative CLINICAL DATA:  Gross and microscopic hematuria. Possible bladder mass on ultrasound.  EXAM: CT ABDOMEN AND PELVIS WITHOUT AND WITH CONTRAST  TECHNIQUE: Multidetector CT imaging of the abdomen and pelvis was performed following the standard protocol before and following the bolus administration of intravenous contrast.  RADIATION DOSE REDUCTION: This exam was performed according to  the departmental dose-optimization program which includes automated exposure control, adjustment of the mA and/or kV according to patient size and/or use of iterative reconstruction technique.  CONTRAST:  OMNIPAQUE  IOHEXOL  300 MG/ML  SOLN  COMPARISON:  Renal ultrasound 05/05/2021. Abdominopelvic CT 05/23/2010.  FINDINGS: Lower chest: Scarring at both lung bases. No significant pleural or pericardial effusion. There is atherosclerosis of the aorta and coronary arteries. There are calcifications of the aortic valve.  Hepatobiliary: The liver is normal in density without suspicious focal abnormality. No evidence of gallstones, gallbladder wall thickening or biliary dilatation.  Pancreas: Unremarkable. No pancreatic ductal dilatation or surrounding inflammatory changes.  Spleen: Normal in size without focal abnormality.  Adrenals/Urinary Tract: Stable appearance of the adrenal glands with mild hypertrophy on the left. No focal mass lesion. Pre-contrast images demonstrate no renal, ureteral or bladder calculi. Post-contrast, both kidneys enhance normally. There is no evidence of enhancing renal mass. There is a small simple appearing cyst in the upper pole of the right kidney measuring 8 mm on image 34/7 for which no specific follow-up is necessary. Delayed images result in segmental visualization of the ureters. No focal upper tract urothelial abnormalities are identified. No gross bladder lesion identified. There is a potential small mass near the right ureteral orifice (best seen on coronal image 71/10 and sagittal image 77/11).  Stomach/Bowel: No enteric contrast administered. Postsurgical changes consistent with gastric sleeve resection. No evidence of bowel distension, wall thickening or surrounding inflammation. The appendix appears normal.  Vascular/Lymphatic: There are no enlarged abdominal or pelvic lymph nodes. Diffuse aortic and branch vessel atherosclerosis  without acute vascular findings or aneurysm.  Reproductive: The prostate gland appears unremarkable.  Other: Previous ventral hernia repair.  No ascites.  Musculoskeletal: No acute or significant osseous findings. Multilevel spondylosis.  IMPRESSION: 1. No clear explanation for hematuria identified. No evidence of renal mass, urinary tract calculus or hydronephrosis. 2. Potential small bladder lesion  near the right ureteral orifice as suggested on previous ultrasound. Recommend correlation with cystoscopy. 3.  Aortic Atherosclerosis (ICD10-I70.0).   Electronically Signed By: Elmon Hagedorn M.D. On: 06/08/2021 12:08  No results found for this or any previous visit.   Assessment & Plan:    1. Malignant neoplasm of urinary bladder, unspecified site (HCC) (Primary) Followup 3 months for cystoscopy - Bladder Voiding Trial - ciprofloxacin  (CIPRO ) tablet 500 mg   No follow-ups on file.  Johnie Nailer, MD  [facility] [place]

## 2023-06-30 NOTE — Patient Instructions (Signed)

## 2023-07-07 DIAGNOSIS — F5221 Male erectile disorder: Secondary | ICD-10-CM | POA: Diagnosis not present

## 2023-07-07 DIAGNOSIS — I10 Essential (primary) hypertension: Secondary | ICD-10-CM | POA: Diagnosis not present

## 2023-07-07 DIAGNOSIS — E876 Hypokalemia: Secondary | ICD-10-CM | POA: Diagnosis not present

## 2023-07-07 DIAGNOSIS — M542 Cervicalgia: Secondary | ICD-10-CM | POA: Diagnosis not present

## 2023-07-07 DIAGNOSIS — N529 Male erectile dysfunction, unspecified: Secondary | ICD-10-CM | POA: Diagnosis not present

## 2023-07-14 ENCOUNTER — Encounter: Admitting: Urology

## 2023-07-29 ENCOUNTER — Ambulatory Visit (INDEPENDENT_AMBULATORY_CARE_PROVIDER_SITE_OTHER): Admitting: Gastroenterology

## 2023-08-30 ENCOUNTER — Ambulatory Visit (HOSPITAL_COMMUNITY)
Admission: RE | Admit: 2023-08-30 | Discharge: 2023-08-30 | Disposition: A | Discharge status: Home / Self Care | Payer: Medicare HMO | Source: Ambulatory Visit | Attending: Internal Medicine | Admitting: Internal Medicine

## 2023-08-30 DIAGNOSIS — Z136 Encounter for screening for cardiovascular disorders: Secondary | ICD-10-CM | POA: Insufficient documentation

## 2023-08-30 DIAGNOSIS — I719 Aortic aneurysm of unspecified site, without rupture: Secondary | ICD-10-CM | POA: Insufficient documentation

## 2023-08-30 DIAGNOSIS — R42 Dizziness and giddiness: Secondary | ICD-10-CM | POA: Insufficient documentation

## 2023-08-30 DIAGNOSIS — I700 Atherosclerosis of aorta: Secondary | ICD-10-CM | POA: Diagnosis not present

## 2023-08-30 DIAGNOSIS — I2510 Atherosclerotic heart disease of native coronary artery without angina pectoris: Secondary | ICD-10-CM | POA: Diagnosis not present

## 2023-08-30 DIAGNOSIS — I3139 Other pericardial effusion (noninflammatory): Secondary | ICD-10-CM | POA: Diagnosis not present

## 2023-08-30 DIAGNOSIS — I7121 Aneurysm of the ascending aorta, without rupture: Secondary | ICD-10-CM | POA: Diagnosis not present

## 2023-08-30 MED ORDER — IOHEXOL 350 MG/ML SOLN
75.0000 mL | Freq: Once | INTRAVENOUS | Status: AC | PRN
Start: 2023-08-30 — End: 2023-08-30
  Administered 2023-08-30: 75 mL via INTRAVENOUS

## 2023-09-09 NOTE — Progress Notes (Unsigned)
 Cardiology Office Note    Date:  09/10/2023  ID:  Dennis Mitchell, DOB [birth year], MRN [id number] Cardiologist: Diannah SHAUNNA Maywood, MD Cardiology APP:  Johnson Laymon HERO, PA-C { :  History of Present Illness:    Dennis Mitchell is a 73 y.o. male with past medical history of HTN, Type II DM, ascending aortic aneurysm, aortic stenosis and tobacco use who presents to the office today for follow-up.  He was last examined by Dr. Mallipeddi in 09/2022 and reported ongoing dizziness for over the past 3 months which would occur with exertion. He denied any chest pain or palpitations. Was still smoking approximately 1 pack/day. An echocardiogram was recommended for further assessment along with a Zio patch and carotid dopplers. Carotid Dopplers showed 1 to 39% stenosis bilaterally. His echocardiogram showed a preserved EF of 60 to 65% with no regional wall motion abnormalities. He did have grade 1 diastolic dysfunction, normal RV function, mild MR, mild to moderate AI and mild AS. Was also noted to have mild dilatation of the aortic root at 42 mm and mild dilatation of the ascending aorta at 42 mm.  He did contact the office in 04/2023 needing preoperative cardiac clearance as recent EKG had shown concerns for atrial fibrillation. This was reviewed and seemed most consistent with normal sinus rhythm with PAC's. While his prior monitor from 09/2022 had not officially resulted, the daily reports appeared most consistent with sinus rhythm with PAC's and no definitive atrial fibrillation. He was not felt to require further medication changes prior to surgery and was cleared to proceed with this. He eventually underwent TURBT in 06/2023 with no immediate complications.  In talking with the patient today, he reports things have been stable since his last office visit. He denies any palpitations  Says that he did switch his regular coffee intake to decaffeinated coffee. Still consumes 3-4 beers most nights but  says it is a reduction from his prior intake. He denies any specific chest pain or dyspnea on exertion. No recent palpitations, orthopnea, PND or pitting edema. Says he is anxious about his CT results which were performed last month as he has not yet been called about these.  Studies Reviewed:   EKG: EKG is not ordered today.  Echocardiogram: 09/2022 IMPRESSIONS     1. Left ventricular ejection fraction, by estimation, is 60 to 65%. The  left ventricle has normal function. The left ventricle has no regional  wall motion abnormalities. There is mild concentric left ventricular  hypertrophy. Left ventricular diastolic  parameters are consistent with Grade I diastolic dysfunction (impaired  relaxation).   2. Right ventricular systolic function is normal. The right ventricular  size is normal. Tricuspid regurgitation signal is inadequate for assessing  PA pressure.   3. Left atrial size was moderately dilated.   4. The mitral valve is degenerative. Mild mitral valve regurgitation.   5. The aortic valve is tricuspid. There is moderate calcification of the  aortic valve. Aortic valve regurgitation is mild to moderate. Mild aortic  valve stenosis. Aortic regurgitation PHT measures 436 msec. Aortic valve  mean gradient measures 10.7 mmHg.   Aortic valve Vmax measures 2.16 m/s.   6. Aortic dilatation noted. There is mild dilatation of the aortic root,  measuring 42 mm. There is mild dilatation of the ascending aorta,  measuring 42 mm.   7. The inferior vena cava is normal in size with greater than 50%  respiratory variability, suggesting right atrial pressure of 3 mmHg.  Comparison(s): No prior Echocardiogram.    Carotid Dopplers: 09/2022 Summary:  Right Carotid: Velocities in the right ICA are consistent with a 1-39%  stenosis.                Non-hemodynamically significant plaque <50% noted in the  CCA.                  The ECA appears <50% stenosed.   Left Carotid: Velocities  in the left ICA are consistent with a 1-39%  stenosis.                 Non-hemodynamically significant plaque <50% noted in the  CCA.               The ECA appears <50% stenosed.   Physical Exam:   VS:  BP 114/64   Pulse 94   Wt 195 lb 6.4 oz (88.6 kg)   SpO2 98%   BMI 30.60 kg/m    Wt Readings from Last 3 Encounters:  09/10/23 195 lb 6.4 oz (88.6 kg)  06/23/23 192 lb 7.4 oz (87.3 kg)  05/11/23 192 lb 7.4 oz (87.3 kg)     GEN: Well nourished, well developed male appearing in no acute distress NECK: No JVD; No carotid bruits CARDIAC: RRR, 2/6 systolic murmur along RUSB.  RESPIRATORY:  Clear to auscultation without rales, wheezing or rhonchi  ABDOMEN: Appears non-distended. No obvious abdominal masses. EXTREMITIES: No clubbing or cyanosis. No pitting edema.  Distal pedal pulses are 2+ bilaterally.   Assessment and Plan:   1. Essential hypertension - BP is well-controlled at 114/64 during today's visit. Continue current medical therapy with Amlodipine  10 mg daily, HCTZ 25 mg daily and Lisinopril  20 mg daily.  2. Aneurysm of ascending aorta without rupture (HCC) - Echocardiogram in 09/2022 showed the aortic root and ascending aorta were mildly dilated at 42 mm. Recent CT earlier this month showed his ascending thoracic aortic aneurysm remained stable at 4.2 cm with annual imaging recommended. Would order at the time of his next office visit if not obtained by his PCP in the interim.  3. Aortic valve stenosis, etiology of cardiac valve disease unspecified - Echocardiogram in 09/2022 showed mild to moderate AI and mild AS. Will obtain a follow-up echocardiogram prior to his next office visit.  4. PAC (premature atrial contraction) - Prior monitor showed predominantly normal sinus rhythm with PAC's and no definitive atrial fibrillation or atrial flutter.  He denies any recent palpitations. Was encouraged to continue to reduce caffeine and alcohol consumption.  Recommended limiting  alcohol intake to less than 2 drinks per day.  5. Tobacco Use - He continues to smoke approximately 1 ppd. Cessation advised.   Signed, Laymon CHRISTELLA Qua, PA-C

## 2023-09-10 ENCOUNTER — Ambulatory Visit: Attending: Student | Admitting: Student

## 2023-09-10 ENCOUNTER — Encounter: Payer: Self-pay | Admitting: Student

## 2023-09-10 VITALS — BP 114/64 | HR 94 | Wt 195.4 lb

## 2023-09-10 DIAGNOSIS — I10 Essential (primary) hypertension: Secondary | ICD-10-CM | POA: Diagnosis not present

## 2023-09-10 DIAGNOSIS — E785 Hyperlipidemia, unspecified: Secondary | ICD-10-CM

## 2023-09-10 DIAGNOSIS — I491 Atrial premature depolarization: Secondary | ICD-10-CM

## 2023-09-10 DIAGNOSIS — I7121 Aneurysm of the ascending aorta, without rupture: Secondary | ICD-10-CM | POA: Diagnosis not present

## 2023-09-10 DIAGNOSIS — I350 Nonrheumatic aortic (valve) stenosis: Secondary | ICD-10-CM

## 2023-09-10 DIAGNOSIS — Z720 Tobacco use: Secondary | ICD-10-CM

## 2023-09-10 NOTE — Patient Instructions (Signed)
 Medication Instructions:   Your physician recommends that you continue on your current medications as directed. Please refer to the Current Medication list given to you today.   Labwork: None today  Testing/Procedures: Your physician has requested that you have an echocardiogram next July or August 2026. Echocardiography is a painless test that uses sound waves to create images of your heart. It provides your doctor with information about the size and shape of your heart and how well your heart's chambers and valves are working. This procedure takes approximately one hour. There are no restrictions for this procedure. Please do NOT wear cologne, perfume, aftershave, or lotions (deodorant is allowed). Please arrive 15 minutes prior to your appointment time.  Please note: We ask at that you not bring children with you during ultrasound (echo/ vascular) testing. Due to room size and safety concerns, children are not allowed in the ultrasound rooms during exams. Our front office staff cannot provide observation of children in our lobby area while testing is being conducted. An adult accompanying a patient to their appointment will only be allowed in the ultrasound room at the discretion of the ultrasound technician under special circumstances. We apologize for any inconvenience.   Follow-Up: 1 year with B.Strader, PA_C, or Dr.Mallipeddi  Any Other Special Instructions Will Be Listed Below (If Applicable).  If you need a refill on your cardiac medications before your next appointment, please call your pharmacy.

## 2023-09-16 ENCOUNTER — Ambulatory Visit: Payer: Self-pay | Admitting: Internal Medicine

## 2023-09-20 ENCOUNTER — Encounter: Payer: Self-pay | Admitting: *Deleted

## 2023-09-24 ENCOUNTER — Telehealth: Payer: Self-pay | Admitting: Internal Medicine

## 2023-09-24 NOTE — Telephone Encounter (Signed)
 The patient has been notified of the result and verbalized understanding.  All questions (if any) were answered. DOMINIC COLUMBUS, LPN 0/87/7974 0:40 AM

## 2023-09-24 NOTE — Telephone Encounter (Signed)
 Pt calling after receiving a letter about his test results. Please advise

## 2023-09-24 NOTE — Telephone Encounter (Signed)
 The patient has been notified of the result and verbalized understanding.  All questions (if any) were answered. DOMINIC COLUMBUS, LPN 0/87/7974 0:41 AM

## 2023-10-03 ENCOUNTER — Ambulatory Visit
Admission: EM | Admit: 2023-10-03 | Discharge: 2023-10-03 | Disposition: A | Discharge status: Home / Self Care | Attending: Family Medicine | Admitting: Family Medicine

## 2023-10-03 DIAGNOSIS — S80812A Abrasion, left lower leg, initial encounter: Secondary | ICD-10-CM | POA: Diagnosis not present

## 2023-10-03 DIAGNOSIS — R600 Localized edema: Secondary | ICD-10-CM

## 2023-10-03 DIAGNOSIS — L089 Local infection of the skin and subcutaneous tissue, unspecified: Secondary | ICD-10-CM

## 2023-10-03 MED ORDER — CHLORHEXIDINE GLUCONATE 4 % EX SOLN: Freq: Every day | CUTANEOUS | 0 refills | Status: AC | PRN

## 2023-10-03 MED ORDER — DOXYCYCLINE HYCLATE 100 MG PO CAPS: 100.0000 mg | ORAL_CAPSULE | Freq: Two times a day (BID) | ORAL | 0 refills | Status: AC

## 2023-10-03 MED ORDER — MUPIROCIN 2 % EX OINT: 1.0000 | TOPICAL_OINTMENT | Freq: Two times a day (BID) | CUTANEOUS | 0 refills | Status: AC

## 2023-10-03 MED ORDER — BACITRACIN 500 UNIT/GM EX OINT
1.0000 | TOPICAL_OINTMENT | Freq: Two times a day (BID) | CUTANEOUS | Status: DC
  Administered 2023-10-03: 1 via TOPICAL

## 2023-10-03 NOTE — Discharge Instructions (Signed)
 Clean the area at least once a day with the Hibiclens  solution and apply the mupirocin  ointment.  Cover the areas with nonstick gauze pads and wrapped the area and Coban wrap to hold in place.  Elevate the leg at rest to help with swelling, take your antibiotics and follow-up for worsening or unresolving symptoms.

## 2023-10-03 NOTE — ED Triage Notes (Signed)
 Pt reports two abrasions to the lower left leg, was chopping wood when the injury occurred, wounded area is currently draining x 1 week.

## 2023-10-03 NOTE — ED Notes (Signed)
 Site cleaned prior to dressing application by CMA.  Bacitracin  applied to site on LLE, nonadherent pad, secured with coban. Pt tolerated well.   Site management and infection prevention education reviewed. Pt verbalized understanding.

## 2023-10-03 NOTE — ED Provider Notes (Signed)
 [facility]    CSN: [id number] Arrival date & time: 10/03/23  1100      History   Chief Complaint No chief complaint on file.   HPI Dennis Mitchell is a 73 y.o. male.   Patient presenting today with 1 week history of scabbed draining abrasions to the left lower leg.  He states the leg is now swelling and red, warm in these areas.  Denies fever, chills, nausea, vomiting, body aches.  States he is applying topical ointments and placing bandages with no relief.  Past medical history significant for diabetes, neuropathy, venous stasis.    Past Medical History:  Diagnosis Date   Anxiety    Arthritis    Back pain    COPD (chronic obstructive pulmonary disease) (HCC)    Depression    Diabetes mellitus    due to weight loss, lost 70 lbs. due to gastric bypass    Dysphagia    Fatigue    History of hiatal hernia    Hypercholesteremia    Hypertension    Neuropathic pain    Skin cancer of face    Sleep apnea    Cpap   Trauma 2010   multiple traumas- neck, back, ribs, collar bones, - resulted in prolonged hosp. & rehab stay , with trach    Patient Active Problem List   Diagnosis Date Noted   Iron deficiency 04/19/2023   Dizziness 09/22/2022   Aneurysm of ascending aorta (HCC) 09/22/2022   Cellulitis 06/27/2022   Right clavicle fracture 06/27/2022   Alcohol abuse 06/27/2022   Tobacco abuse 06/27/2022   Decreased pedal pulses 06/27/2022   Malignant neoplasm of trigone of urinary bladder (HCC); low grade, Ta; s/p TURBT 6/23 10/02/2021   Gross hematuria 05/15/2021   Bladder tumor 05/15/2021   Obstructive sleep apnea syndrome 11/04/2020   Cervical spondylosis with myelopathy and radiculopathy 12/26/2018   Cervical spondylosis 12/26/2018   Morbid obesity (HCC) 03/19/2015   Erectile dysfunction 02/08/2013   Venous stasis 02/08/2013   Obstructive sleep apnea 07/31/2012   Essential hypertension, benign 07/31/2012   Depression 07/31/2012   Benign essential  hypertension 07/31/2012   Benign paroxysmal positional vertigo 05/24/2012   Chronic back pain 04/06/2012   Insomnia 04/06/2012   Other and unspecified hyperlipidemia 04/06/2012   Hypotension, unspecified 01/01/2011   Allergic reaction 01/01/2011   CAP (community acquired pneumonia) 01/01/2011   ARF (acute renal failure) (HCC) 01/01/2011   DM type 2 (diabetes mellitus, type 2) (HCC) 01/01/2011   Type 2 diabetes mellitus (HCC) 01/01/2011   Low blood pressure 01/01/2011    Past Surgical History:  Procedure Laterality Date   ANTERIOR CERVICAL DECOMP/DISCECTOMY FUSION N/A 12/26/2018   Procedure: Anterior Cervical Decompression Fusion - Cervical Four - Cervical Five - Cervical Five - Cervical Six - Cervical Six - Cervical Seven;  Surgeon: Louis Shove, MD;  Location: [facility];  Service: Neurosurgery;  Laterality: N/A;  Anterior Cervical Decompression Fusion - Cervical Four - Cervical Five - Cervical Five - Cervical Six - Cervical Six - Cervical Seven   BLADDER INSTILLATION N/A 06/24/2023   Procedure: INSTILLATION, BLADDER;  Surgeon: Sherrilee Belvie CROME, MD;  Location: [facility];  Service: Urology;  Laterality: N/A;  gemcitabine    BREATH TEK H PYLORI N/A 03/01/2014   Procedure: BREATH TEK H PYLORI;  Surgeon: Alm Angle, MD;  Location: [facility];  Service: General;  Laterality: N/A;   COLONOSCOPY N/A 09/20/2013   Procedure: COLONOSCOPY;  Surgeon: Claudis RAYMOND Rivet, MD;  Location: [facility];  Service: Endoscopy;  Laterality: N/A;  930   COLONOSCOPY N/A 04/28/2023   Procedure: COLONOSCOPY;  Surgeon: Eartha Angelia Sieving, MD;  Location: [facility];  Service: Gastroenterology;  Laterality: N/A;  1:15PM; ASA 1   CYSTOSCOPY W/ URETERAL STENT PLACEMENT Right 07/02/2021   Procedure: CYSTOSCOPY WITH  BILATERAL RETROGRADE PYELOGRAM/URETERAL STENT PLACEMENT;  Surgeon: Roseann Adine PARAS., MD;  Location: [facility];  Service: Urology;  Laterality: Right;   DOG BITE     LAPAROSCOPIC GASTRIC SLEEVE RESECTION  WITH HIATAL HERNIA REPAIR N/A 03/19/2015   Procedure: LAPAROSCOPIC GASTRIC SLEEVE RESECTION WITH  HIATAL HERNIA REPAIR;  Surgeon: Alm Angle, MD;  Location: [facility];  Service: General;  Laterality: N/A;   TRACHEOSTOMY  2010   TRANSURETHRAL RESECTION OF BLADDER TUMOR N/A 07/02/2021   Procedure: TRANSURETHRAL RESECTION OF BLADDER TUMOR (TURBT);  Surgeon: Roseann Adine PARAS., MD;  Location: [facility];  Service: Urology;  Laterality: N/A;   TRANSURETHRAL RESECTION OF BLADDER TUMOR N/A 06/24/2023   Procedure: TURBT (TRANSURETHRAL RESECTION OF BLADDER TUMOR);  Surgeon: Sherrilee Belvie CROME, MD;  Location: [facility];  Service: Urology;  Laterality: N/A;   UPPER GI ENDOSCOPY  03/19/2015   Procedure: UPPER GI ENDOSCOPY;  Surgeon: Alm Angle, MD;  Location: [facility];  Service: General;;       Home Medications    Prior to Admission medications   Medication Sig Start Date End Date Taking? Authorizing Provider  chlorhexidine  (HIBICLENS ) 4 % external liquid Apply topically daily as needed. 10/03/23  Yes Stuart Vernell Norris, PA-C  doxycycline  (VIBRAMYCIN ) 100 MG capsule Take 1 capsule (100 mg total) by mouth 2 (two) times daily. 10/03/23  Yes Stuart Vernell Norris, PA-C  mupirocin  ointment (BACTROBAN ) 2 % Apply 1 Application topically 2 (two) times daily. 10/03/23  Yes Stuart Vernell Norris, PA-C  Acetaminophen  (TYLENOL  ARTHRITIS PAIN PO) Take 650-1,300 mg by mouth every 8 (eight) hours as needed (pain).    [provider]  albuterol  (ACCUNEB ) 1.25 MG/3ML nebulizer solution Take 1 ampule by nebulization every 6 (six) hours as needed for wheezing or shortness of breath.    [provider]  albuterol  (VENTOLIN  HFA) 108 (90 Base) MCG/ACT inhaler Inhale 1-2 puffs into the lungs every 6 (six) hours as needed for wheezing or shortness of breath. 03/03/23   Chandra Harlene LABOR, NP  amLODipine  (NORVASC ) 10 MG tablet Take 1 tablet (10 mg total) by mouth daily. 06/28/22   Pearlean Manus, MD  B Complex-C  (B-COMPLEX WITH VITAMIN C) tablet Take 1 tablet by mouth daily.    [provider]  BREZTRI AEROSPHERE 160-9-4.8 MCG/ACT AERO Inhale 2 puffs into the lungs in the morning and at bedtime. 03/31/23   [provider]  carbamide peroxide (DEBROX) 6.5 % OTIC solution Place 5 drops into both ears 2 (two) times daily as needed (ear wax buildup). 12/01/22   [provider]  Cholecalciferol (VITAMIN D3 PO) Take 1 tablet by mouth daily.    [provider]  citalopram  (CELEXA ) 40 MG tablet Take 1 tablet (40 mg total) by mouth daily. 08/29/20   Elnor Lauraine BRAVO, NP  cyclobenzaprine  (FLEXERIL ) 10 MG tablet Take 1 tablet (10 mg total) by mouth 3 (three) times daily as needed for muscle spasms. 08/29/20   Elnor Lauraine BRAVO, NP  diclofenac  sodium (VOLTAREN ) 1 % GEL APPLY TOPICALLY TWICE DAILY TO AFFECTED AREA AS NEEDED FOR PAIN Patient taking differently: Apply 2 g topically 2 (two) times daily as needed (pain knee). 02/18/17   Alphonsa Fallow  S, MD  empagliflozin (JARDIANCE) 10 MG TABS tablet Take 10 mg by mouth in the morning.    [provider]  EPINEPHrine  0.3 mg/0.3 mL IJ SOAJ injection Inject 0.3 mg into the muscle as needed for anaphylaxis. 07/03/20   Elnor Lauraine BRAVO, NP  ferrous sulfate 325 (65 FE) MG tablet Take 325 mg by mouth in the morning.    [provider]  gabapentin (NEURONTIN) 400 MG capsule Take 400 mg by mouth 3 (three) times daily as needed (pain.).    [provider]  hydrochlorothiazide  (HYDRODIURIL ) 25 MG tablet Take 25 mg by mouth in the morning.    [provider]  hydrOXYzine  (VISTARIL ) 25 MG capsule Take 1 capsule (25 mg total) by mouth every 8 (eight) hours as needed for itching. 08/29/20   Elnor Lauraine BRAVO, NP  lisinopril  (ZESTRIL ) 20 MG tablet Take 1 tablet (20 mg total) by mouth daily. 09/22/22   Mallipeddi, Vishnu P, MD  meclizine  (ANTIVERT ) 25 MG tablet Take 1 tablet (25 mg total) by mouth 3 (three) times daily as needed for  dizziness. 08/29/20   Elnor Lauraine BRAVO, NP  metFORMIN  (GLUCOPHAGE ) 500 MG tablet Take 500 mg by mouth in the morning. 06/30/22   [provider]  Multiple Vitamins-Minerals (EQ VISION FORMULA 50+ PO) Take 1 tablet by mouth in the morning.    [provider]  pantoprazole  (PROTONIX ) 40 MG tablet Take 1 tablet (40 mg total) by mouth daily. 04/28/23   Eartha Angelia Sieving, MD  potassium chloride  (KLOR-CON  M) 10 MEQ tablet Take 10 mEq by mouth in the morning.    [provider]  psyllium (HYDROCIL/METAMUCIL) 95 % PACK Take 1 packet by mouth daily.    [provider]  sildenafil  (VIAGRA ) 100 MG tablet Take 100 mg by mouth daily as needed.    [provider]  traZODone  (DESYREL ) 50 MG tablet Take 50 mg by mouth at bedtime.    [provider]    Family History Family History  Problem Relation Age of Onset   Hypertension Father    Hypertension Sister    Hypertension Brother    Sleep apnea Neg Hx     Social History Social History   Tobacco Use   Smoking status: Some Days    Types: E-cigarettes, Cigarettes   Smokeless tobacco: Never   Tobacco comments:    Using electric cigarettes  Vaping Use   Vaping status: Every Day   Substances: Nicotine   Substance Use Topics   Alcohol use: Yes    Alcohol/week: 8.0 standard drinks of alcohol    Types: 8 Cans of beer per week   Drug use: No     Allergies   Bee venom, Aspirin, Lisinopril , and Penicillins   Review of Systems Review of Systems Per HPI  Physical Exam Triage Vital Signs ED Triage Vitals  Encounter Vitals Group     BP 10/03/23 1110 124/73     Girls Systolic BP Percentile --      Girls Diastolic BP Percentile --      Boys Systolic BP Percentile --      Boys Diastolic BP Percentile --      Pulse Rate 10/03/23 1110 78     Resp 10/03/23 1110 20     Temp 10/03/23 1110 98.5 F (36.9 C)     Temp Source 10/03/23 1110 Oral     SpO2 10/03/23 1110 96 %     Weight --       Height --  Head Circumference --      Peak Flow --      Pain Score 10/03/23 1114 0     Pain Loc --      Pain Education --      Exclude from Growth Chart --    No data found.  Updated Vital Signs BP 124/73 (BP Location: Right Arm)   Pulse 78   Temp 98.5 F (36.9 C) (Oral)   Resp 20   SpO2 96%   Visual Acuity Right Eye Distance:   Left Eye Distance:   Bilateral Distance:    Right Eye Near:   Left Eye Near:    Bilateral Near:     Physical Exam Vitals and nursing note reviewed.  Constitutional:      Appearance: Normal appearance.  HENT:     Head: Atraumatic.  Eyes:     Extraocular Movements: Extraocular movements intact.     Conjunctiva/sclera: Conjunctivae normal.  Cardiovascular:     Rate and Rhythm: Normal rate.  Pulmonary:     Effort: Pulmonary effort is normal.  Musculoskeletal:        General: Normal range of motion.     Cervical back: Normal range of motion and neck supple.  Skin:    General: Skin is warm.     Findings: Erythema present.     Comments: 2 scabbed abrasions to the left lower leg both draining serous fluid, the more lateral area with some mild fluctuance and purulence underneath the wound bed.  Surrounding erythema, edema, warmth to the area  Neurological:     Mental Status: He is oriented to person, place, and time.     Comments: Left lower extremity neurovascularly intact  Psychiatric:        Mood and Affect: Mood normal.        Thought Content: Thought content normal.        Judgment: Judgment normal.      UC Treatments / Results  Labs (all labs ordered are listed, but only abnormal results are displayed) Labs Reviewed - No data to display  EKG   Radiology No results found.  Procedures Procedures (including critical care time)  Medications Ordered in UC Medications  bacitracin  ointment 1 Application (1 Application Topical Given 10/03/23 1205)    Initial Impression / Assessment and Plan / UC Course  I have reviewed the  triage vital signs and the nursing notes.  Pertinent labs & imaging results that were available during my care of the patient were reviewed by me and considered in my medical decision making (see chart for details).     Infected abrasions and worsening leg edema.  Discussed elevation, compression for edema and doxycycline , Hibiclens , Bactroban , nonstick dressings for the cellulitis.  Up-to-date on Tdap per chart review, 2023.  Return for worsening symptoms.  Final Clinical Impressions(s) / UC Diagnoses   Final diagnoses:  Infected abrasion of left lower extremity, initial encounter  Leg edema, left     Discharge Instructions      Clean the area at least once a day with the Hibiclens  solution and apply the mupirocin  ointment.  Cover the areas with nonstick gauze pads and wrapped the area and Coban wrap to hold in place.  Elevate the leg at rest to help with swelling, take your antibiotics and follow-up for worsening or unresolving symptoms.    ED Prescriptions     Medication Sig Dispense Auth. Provider   doxycycline  (VIBRAMYCIN ) 100 MG capsule Take 1 capsule (100 mg total) by  mouth 2 (two) times daily. 14 capsule Stuart Vernell Norris, PA-C   chlorhexidine  (HIBICLENS ) 4 % external liquid Apply topically daily as needed. 236 mL Stuart Vernell Norris, PA-C   mupirocin  ointment (BACTROBAN ) 2 % Apply 1 Application topically 2 (two) times daily. 60 g Stuart Vernell Norris, [place]      PDMP not reviewed this encounter.   Stuart Vernell Norris, PA-C 10/03/23 1325

## 2023-10-04 ENCOUNTER — Ambulatory Visit: Admitting: Urology

## 2023-10-04 VITALS — BP 122/64 | HR 64

## 2023-10-04 DIAGNOSIS — C679 Malignant neoplasm of bladder, unspecified: Secondary | ICD-10-CM

## 2023-10-04 DIAGNOSIS — D494 Neoplasm of unspecified behavior of bladder: Secondary | ICD-10-CM | POA: Diagnosis not present

## 2023-10-04 LAB — URINALYSIS, ROUTINE W REFLEX MICROSCOPIC
Bilirubin, UA: NEGATIVE
Ketones, UA: NEGATIVE
Leukocytes,UA: NEGATIVE
Nitrite, UA: NEGATIVE
RBC, UA: NEGATIVE
Specific Gravity, UA: 1.015 (ref 1.005–1.030)
Urobilinogen, Ur: 1 mg/dL (ref 0.2–1.0)
pH, UA: 6.5 (ref 5.0–7.5)

## 2023-10-04 LAB — MICROSCOPIC EXAMINATION
Bacteria, UA: NONE SEEN
RBC, Urine: NONE SEEN /HPF (ref 0–2)

## 2023-10-04 MED ORDER — CIPROFLOXACIN HCL 500 MG PO TABS
500.0000 mg | ORAL_TABLET | Freq: Once | ORAL | Status: AC
  Administered 2023-10-04: 500 mg via ORAL

## 2023-10-04 NOTE — Progress Notes (Unsigned)
   10/04/23  CC: followup bladder tumor   HPI: Mr Dennis Mitchell is a 73yo here for followup for a bladder tumor Blood pressure 122/64, pulse 64. NED. A&Ox3.   No respiratory distress   Abd soft, NT, ND Normal phallus with bilateral descended testicles  Cystoscopy Procedure Note  Patient identification was confirmed, informed consent was obtained, and patient was prepped using Betadine solution.  Lidocaine  jelly was administered per urethral meatus.     Pre-Procedure: - Inspection reveals a normal caliber ureteral meatus.  Procedure: The flexible cystoscope was introduced without difficulty - No urethral strictures/lesions are present. - {Blank multiple:19197::Enlarged,Surgically absent,Normal} prostate *** - {Blank multiple:19197::Normal,Elevated,Tight} bladder neck - Bilateral ureteral orifices identified - Bladder mucosa  reveals no ulcers, tumors, or lesions - No bladder stones - No trabeculation  Retroflexion shows ***   Post-Procedure: - Patient tolerated the procedure well  Assessment/ Plan:   No follow-ups on file.  Belvie Clara, MD

## 2023-10-05 ENCOUNTER — Encounter: Payer: Self-pay | Admitting: Urology

## 2023-10-27 ENCOUNTER — Encounter (INDEPENDENT_AMBULATORY_CARE_PROVIDER_SITE_OTHER): Payer: Self-pay | Admitting: Gastroenterology

## 2023-11-08 DIAGNOSIS — M7741 Metatarsalgia, right foot: Secondary | ICD-10-CM | POA: Diagnosis not present

## 2023-11-08 DIAGNOSIS — L851 Acquired keratosis [keratoderma] palmaris et plantaris: Secondary | ICD-10-CM | POA: Diagnosis not present

## 2023-11-08 DIAGNOSIS — M79674 Pain in right toe(s): Secondary | ICD-10-CM | POA: Diagnosis not present

## 2023-12-02 DIAGNOSIS — M25561 Pain in right knee: Secondary | ICD-10-CM | POA: Diagnosis not present

## 2023-12-02 DIAGNOSIS — M170 Bilateral primary osteoarthritis of knee: Secondary | ICD-10-CM | POA: Diagnosis not present

## 2023-12-02 DIAGNOSIS — M25562 Pain in left knee: Secondary | ICD-10-CM | POA: Diagnosis not present

## 2023-12-02 DIAGNOSIS — R262 Difficulty in walking, not elsewhere classified: Secondary | ICD-10-CM | POA: Diagnosis not present

## 2023-12-06 DIAGNOSIS — E871 Hypo-osmolality and hyponatremia: Secondary | ICD-10-CM | POA: Diagnosis not present

## 2023-12-06 DIAGNOSIS — E559 Vitamin D deficiency, unspecified: Secondary | ICD-10-CM | POA: Diagnosis not present

## 2023-12-06 DIAGNOSIS — I10 Essential (primary) hypertension: Secondary | ICD-10-CM | POA: Diagnosis not present

## 2023-12-06 DIAGNOSIS — E1165 Type 2 diabetes mellitus with hyperglycemia: Secondary | ICD-10-CM | POA: Diagnosis not present

## 2023-12-14 DIAGNOSIS — E7800 Pure hypercholesterolemia, unspecified: Secondary | ICD-10-CM | POA: Diagnosis not present

## 2023-12-14 DIAGNOSIS — Z0000 Encounter for general adult medical examination without abnormal findings: Secondary | ICD-10-CM | POA: Diagnosis not present

## 2023-12-14 DIAGNOSIS — I10 Essential (primary) hypertension: Secondary | ICD-10-CM | POA: Diagnosis not present

## 2023-12-14 DIAGNOSIS — Z23 Encounter for immunization: Secondary | ICD-10-CM | POA: Diagnosis not present

## 2023-12-14 DIAGNOSIS — E871 Hypo-osmolality and hyponatremia: Secondary | ICD-10-CM | POA: Diagnosis not present

## 2024-04-03 ENCOUNTER — Other Ambulatory Visit: Admitting: Urology

## 2024-09-11 ENCOUNTER — Other Ambulatory Visit (HOSPITAL_COMMUNITY)
